# Patient Record
Sex: Male | Born: 1955 | Race: Black or African American | Hispanic: No | Marital: Single | Smoking: Never smoker
Health system: Southern US, Community
[De-identification: ages and names within clinical notes are randomized; demographics above are authoritative.]

## PROBLEM LIST (undated history)

## (undated) DIAGNOSIS — K5900 Constipation, unspecified: Secondary | ICD-10-CM

## (undated) DIAGNOSIS — K259 Gastric ulcer, unspecified as acute or chronic, without hemorrhage or perforation: Secondary | ICD-10-CM

## (undated) DIAGNOSIS — IMO0002 Reserved for concepts with insufficient information to code with codable children: Secondary | ICD-10-CM

---

## 2004-06-21 ENCOUNTER — Emergency Department (HOSPITAL_COMMUNITY): Admission: EM | Admit: 2004-06-21 | Discharge: 2004-06-22 | Payer: Self-pay | Admitting: Emergency Medicine

## 2005-04-02 ENCOUNTER — Ambulatory Visit: Payer: Self-pay | Admitting: Internal Medicine

## 2005-04-04 ENCOUNTER — Ambulatory Visit: Payer: Self-pay | Admitting: Family Medicine

## 2005-05-23 ENCOUNTER — Ambulatory Visit: Payer: Self-pay | Admitting: Family Medicine

## 2005-07-04 ENCOUNTER — Ambulatory Visit: Payer: Self-pay | Admitting: Family Medicine

## 2006-03-29 ENCOUNTER — Emergency Department (HOSPITAL_COMMUNITY): Admission: EM | Admit: 2006-03-29 | Discharge: 2006-03-29 | Payer: Self-pay | Admitting: Emergency Medicine

## 2009-04-29 ENCOUNTER — Inpatient Hospital Stay (HOSPITAL_COMMUNITY): Admission: EM | Admit: 2009-04-29 | Discharge: 2009-05-02 | Payer: Self-pay | Admitting: Emergency Medicine

## 2009-04-29 ENCOUNTER — Ambulatory Visit: Payer: Self-pay | Admitting: Gastroenterology

## 2009-04-30 ENCOUNTER — Encounter: Payer: Self-pay | Admitting: Gastroenterology

## 2009-05-10 ENCOUNTER — Ambulatory Visit: Payer: Self-pay | Admitting: Internal Medicine

## 2009-06-22 ENCOUNTER — Ambulatory Visit: Payer: Self-pay | Admitting: Internal Medicine

## 2009-06-25 ENCOUNTER — Ambulatory Visit: Payer: Self-pay | Admitting: *Deleted

## 2009-07-12 ENCOUNTER — Ambulatory Visit: Payer: Self-pay | Admitting: Internal Medicine

## 2009-07-18 ENCOUNTER — Ambulatory Visit: Payer: Self-pay | Admitting: Internal Medicine

## 2010-01-21 ENCOUNTER — Emergency Department (HOSPITAL_COMMUNITY): Admission: EM | Admit: 2010-01-21 | Discharge: 2010-01-21 | Payer: Self-pay | Admitting: Emergency Medicine

## 2010-06-20 ENCOUNTER — Emergency Department (HOSPITAL_COMMUNITY): Admission: EM | Admit: 2010-06-20 | Discharge: 2010-06-21 | Payer: Self-pay | Admitting: Emergency Medicine

## 2011-03-16 LAB — URINALYSIS, ROUTINE W REFLEX MICROSCOPIC
Bilirubin Urine: NEGATIVE
Nitrite: NEGATIVE
Protein, ur: NEGATIVE mg/dL
Urobilinogen, UA: 0.2 mg/dL (ref 0.0–1.0)
pH: 6 (ref 5.0–8.0)

## 2011-03-16 LAB — COMPREHENSIVE METABOLIC PANEL
ALT: 53 U/L (ref 0–53)
AST: 46 U/L — ABNORMAL HIGH (ref 0–37)
Albumin: 3.7 g/dL (ref 3.5–5.2)
Alkaline Phosphatase: 82 U/L (ref 39–117)
BUN: 14 mg/dL (ref 6–23)
Creatinine, Ser: 0.8 mg/dL (ref 0.4–1.5)
GFR calc non Af Amer: >60 mL/min (ref >60)
Glucose, Bld: 100 mg/dL — ABNORMAL HIGH (ref 70–99)
Potassium: 4.5 mEq/L (ref 3.5–5.1)
Total Protein: 7.3 g/dL (ref 6.0–8.3)

## 2011-03-16 LAB — CBC
HCT: 46.7 % (ref 39.0–52.0)
MCHC: 34.2 g/dL (ref 30.0–36.0)
MCV: 94.7 fL (ref 78.0–100.0)
RBC: 4.93 MIL/uL (ref 4.22–5.81)

## 2011-03-16 LAB — DIFFERENTIAL
Eosinophils Absolute: 0.1 10*3/uL (ref 0.0–0.7)
Lymphocytes Relative: 30 % (ref 12–46)
Lymphs Abs: 2.1 10*3/uL (ref 0.7–4.0)
Neutrophils Relative %: 59 % (ref 43–77)

## 2011-04-08 LAB — CBC
HCT: 13.5 % — ABNORMAL LOW (ref 39.0–52.0)
HCT: 13.8 % — ABNORMAL LOW (ref 39.0–52.0)
HCT: 25.1 % — ABNORMAL LOW (ref 39.0–52.0)
HCT: 28.9 % — ABNORMAL LOW (ref 39.0–52.0)
Hemoglobin: 10.3 g/dL — ABNORMAL LOW (ref 13.0–17.0)
Hemoglobin: 4.5 g/dL — CL (ref 13.0–17.0)
MCHC: 34.5 g/dL (ref 30.0–36.0)
MCHC: 34.5 g/dL (ref 30.0–36.0)
MCV: 91.8 fL (ref 78.0–100.0)
MCV: 92.5 fL (ref 78.0–100.0)
MCV: 92.7 fL (ref 78.0–100.0)
MCV: 99.7 fL (ref 78.0–100.0)
Platelets: 174 10*3/uL (ref 150–400)
Platelets: 177 10*3/uL (ref 150–400)
Platelets: 182 10*3/uL (ref 150–400)
Platelets: 191 10*3/uL (ref 150–400)
RBC: 1.36 MIL/uL — ABNORMAL LOW (ref 4.22–5.81)
RBC: 3.25 MIL/uL — ABNORMAL LOW (ref 4.22–5.81)
RDW: 16 % — ABNORMAL HIGH (ref 11.5–15.5)
RDW: 16.5 % — ABNORMAL HIGH (ref 11.5–15.5)
RDW: 16.8 % — ABNORMAL HIGH (ref 11.5–15.5)
RDW: 18.9 % — ABNORMAL HIGH (ref 11.5–15.5)
WBC: 8.9 10*3/uL (ref 4.0–10.5)
WBC: 9.1 10*3/uL (ref 4.0–10.5)
WBC: 9.5 10*3/uL (ref 4.0–10.5)

## 2011-04-08 LAB — RETICULOCYTES
RBC.: 1.39 MIL/uL — ABNORMAL LOW (ref 4.22–5.81)
Retic Ct Pct: 11.1 % — ABNORMAL HIGH (ref 0.4–3.1)

## 2011-04-08 LAB — COMPREHENSIVE METABOLIC PANEL
AST: 35 U/L (ref 0–37)
Albumin: 3 g/dL — ABNORMAL LOW (ref 3.5–5.2)
Alkaline Phosphatase: 49 U/L (ref 39–117)
Chloride: 114 mEq/L — ABNORMAL HIGH (ref 96–112)
Creatinine, Ser: 0.95 mg/dL (ref 0.4–1.5)
GFR calc Af Amer: >60 mL/min (ref >60)
GFR calc non Af Amer: >60 mL/min (ref >60)
Potassium: 3.8 mEq/L (ref 3.5–5.1)
Total Bilirubin: 0.3 mg/dL (ref 0.3–1.2)
Total Bilirubin: 0.3 mg/dL (ref 0.3–1.2)
Total Protein: 4.8 g/dL — ABNORMAL LOW (ref 6.0–8.3)
Total Protein: 5.6 g/dL — ABNORMAL LOW (ref 6.0–8.3)

## 2011-04-08 LAB — CROSSMATCH: ABO/RH(D): O POS

## 2011-04-08 LAB — LIPID PANEL
Cholesterol: 134 mg/dL (ref 0–200)
LDL Cholesterol: 91 mg/dL (ref 0–99)
Triglycerides: 43 mg/dL (ref <150)
VLDL: 9 mg/dL (ref 0–40)

## 2011-04-08 LAB — BASIC METABOLIC PANEL
BUN: 11 mg/dL (ref 6–23)
BUN: 12 mg/dL (ref 6–23)
CO2: 28 mEq/L (ref 19–32)
Calcium: 8.1 mg/dL — ABNORMAL LOW (ref 8.4–10.5)
Chloride: 110 mEq/L (ref 96–112)
Creatinine, Ser: 1.05 mg/dL (ref 0.4–1.5)
GFR calc non Af Amer: >60 mL/min (ref >60)
GFR calc non Af Amer: >60 mL/min (ref >60)
Glucose, Bld: 116 mg/dL — ABNORMAL HIGH (ref 70–99)
Glucose, Bld: 91 mg/dL (ref 70–99)
Potassium: 4.2 mEq/L (ref 3.5–5.1)
Sodium: 142 mEq/L (ref 135–145)

## 2011-04-08 LAB — APTT: aPTT: <20 seconds — ABNORMAL LOW (ref 24–37)

## 2011-04-08 LAB — CULTURE, BLOOD (ROUTINE X 2)
Culture: NO GROWTH
Culture: NO GROWTH

## 2011-04-08 LAB — DIFFERENTIAL
Basophils Absolute: 0 10*3/uL (ref 0.0–0.1)
Eosinophils Relative: 1 % (ref 0–5)
Eosinophils Relative: 1 % (ref 0–5)
Lymphocytes Relative: 17 % (ref 12–46)
Lymphocytes Relative: 19 % (ref 12–46)
Monocytes Absolute: 0.7 10*3/uL (ref 0.1–1.0)
Monocytes Absolute: 0.8 10*3/uL (ref 0.1–1.0)
Monocytes Relative: 7 % (ref 3–12)
Neutro Abs: 6.9 10*3/uL (ref 1.7–7.7)
Neutrophils Relative %: 73 % (ref 43–77)

## 2011-04-08 LAB — IRON AND TIBC
Iron: 27 ug/dL — ABNORMAL LOW (ref 42–135)
Saturation Ratios: 10 % — ABNORMAL LOW (ref 20–55)

## 2011-04-08 LAB — H. PYLORI ANTIBODY, IGG: H Pylori IgG: 4.1 {ISR} — ABNORMAL HIGH

## 2011-04-08 LAB — URINALYSIS, ROUTINE W REFLEX MICROSCOPIC
Bilirubin Urine: NEGATIVE
Protein, ur: NEGATIVE mg/dL
Specific Gravity, Urine: 1.016 (ref 1.005–1.030)
pH: 7.5 (ref 5.0–8.0)

## 2011-04-08 LAB — HEMOGLOBIN AND HEMATOCRIT, BLOOD
Hemoglobin: 9.2 g/dL — ABNORMAL LOW (ref 13.0–17.0)
Hemoglobin: 9.4 g/dL — ABNORMAL LOW (ref 13.0–17.0)

## 2011-04-08 LAB — FOLATE: Folate: 13.1 ng/mL

## 2011-04-08 LAB — FERRITIN: Ferritin: 26 ng/mL (ref 22–322)

## 2011-04-08 LAB — PROTIME-INR
INR: 1 (ref 0.00–1.49)
Prothrombin Time: 13.9 seconds (ref 11.6–15.2)

## 2011-04-08 LAB — ABO/RH: ABO/RH(D): O POS

## 2011-05-13 NOTE — Discharge Summary (Signed)
NAMEROBEL, WUERTZ             ACCOUNT NO.:  [id number]   MEDICAL RECORD NO.:  [id number]          PATIENT TYPE:  INP   LOCATION:  5526                         FACILITY:  [facility]   PHYSICIAN:  Lonia Blood, M.D.DATE OF BIRTH:  1956/07/24   DATE OF ADMISSION:  04/29/2009  DATE OF DISCHARGE:  05/02/2009                               DISCHARGE SUMMARY   ADDENDUM   PRIMARY CARE PHYSICIAN:  Unassigned - to be [facility].   DISCHARGE DIAGNOSES:  Unchanged from previously dictated summary.   DISCHARGE MEDICATIONS:  Change to the following updated list:  1. Prevpac - use as directed until completed.  2. Iron sulfate 325 mg 1 p.o. b.i.d. for 2 months then stop.  3. Omeprazole 20 mg 1 p.o. daily to be initiated after Prevpac is      completed.   FOLLOW UP:  1. Case management is being asked to assist with establishing the      patient at [facility]  The patient will follow up at [facility]      for a CBC check within one week and thereafter to establish ongoing      care as directed by [facility].  2. The patient is advised to call Dr. Wendall Papa at [facility] for      reevaluation in 2-3 weeks at phone number [phone].   DISCHARGE LABORATORY DATA:  Hemoglobin is 10.8 at time of discharge.  Previously pending H. pylori level has returned positive at 4.6.  No  pathology results are presently available from the patient's EGD.   ADDENDUM TO HOSPITAL COURSE:  Mr. Bruna was reevaluated on May 02, 2009.  Hemoglobin was actually improving.  The patient was asymptomatic.  The  patient was tolerating a regular diet.  He was therefore cleared for  discharge.   For details concerning the patient's hospitalization, please see  previously dictated discharge summary per Dr. Tamsen Roers dated May 01, 2009,  labeled job number [id number].      Lonia Blood, M.D.  Electronically Signed     Lonia Blood, M.D.  Electronically Signed   JTM/MEDQ  D:  05/02/2009  T:  05/02/2009   Job:  [id number]

## 2011-05-13 NOTE — H&P (Signed)
Caleb Rivers, BLITCH             ACCOUNT NO.:  [id number]   MEDICAL RECORD NO.:  [id number]          PATIENT TYPE:  INP   LOCATION:  1826                         FACILITY:  [facility]   PHYSICIAN:  Manus Gunning, MD      DATE OF BIRTH:  [birth year]   DATE OF ADMISSION:  04/29/2009  DATE OF DISCHARGE:                              HISTORY & PHYSICAL   CHIEF COMPLAINT:  Generalized weakness.   HISTORY OF PRESENT ILLNESS:  Caleb Rivers is a non-English speaking African  American male from [place].  H and P has been obtained by his friend who  is at the bedside who has essentially provided translation.  Caleb Rivers  basically complains of generalized body aches and weakness for the past  10 days.  He claims he noticed black sticky stools during bowel  movements approximately at that same period of time.  He claims that he  has been living with this but it has gotten so bad that he presented to  the emergency department today.  Laboratory tests in the emergency  department demonstrated a hemoglobin of 4.5 grams percent and Hemoccult  blood positive.  Gastroenterology has been consulted and they plan for  an EGD in the morning.  Of note, the patient denies any abdominal pain  and denies any nausea or vomiting.  No hematemesis.  No diarrhea,  constipation, dysuria, polyuria or hematuria.  He does complain of  melenic stools.  No bright red blood per rectum.  No musculoskeletal  complaints.  Generalized fatigue and weakness.  No chest pain,  palpitations, PND or orthopnea.  No headaches, syncope, presyncope,  tinnitus, loss of consciousness, seizures or falls.  No recent trauma or  injury.  No ingestion of unusual foods.  No travel.  The patient does  admit that he is taking Motrin and Advil daily, approximately 3 to 4  pills a day depending on how he feels.  He claims the dose is unknown  but usually doses are 200 mg.   PAST MEDICAL HISTORY:  None.   PAST SURGICAL HISTORY:  None.   ALLERGIES:   None.   MEDICATIONS:  Over the counter Motrin and Advil.   FAMILY HISTORY:  The patient is unable to provide due to communication  issues.   REVIEW OF SYSTEMS:  A 14 point review of systems performed.  Pertinent  positives and negatives as described above.   PHYSICAL EXAMINATION:  VITALS AT TIME OF PRESENTATION:  Temperature  afebrile, heart rate 92, respiratory rate 14, blood pressure 132/62, O2  sat 100% on room air.  GENERAL:  Well developed, well nourished African American male lying in  bed comfortably, in no apparent distress, appears fatigued.  HEENT:  Normocephalic, atraumatic.  Moist oral mucosa.  No thrush,  erythema or exudates.  Eyes anicteric.  Extraocular muscles intact.  Conjunctivae pale and dry.  Pupils are equal and react to light and  accommodation.  NECK:  Supple.  Good range of motion.  No thyromegaly.  Flat neck veins.  CARDIOVASCULAR:  S1, S2 normal.  Regular rate and rhythm.  No murmurs,  rubs or  gallops.  RS:  Air entry bilaterally equal.  No rales, rhonchi or wheezes  appreciated.  ABDOMEN:  Soft, nontender and nondistended, positive bowel sounds, no  organomegaly.  SKIN:  No breakdown, swelling, ulcerations or masses.  HEME/ONC:  No palpable lymphadenopathy, ecchymosis, bruising or  petechiae.  CNS:  Alert and oriented times three.  Cranial nerves II-XII grossly  intact.  Power, sensation, and reflexes bilaterally symmetrical.  EXTREMITIES:  No cyanosis, clubbing or edema.  Positive bilateral  dorsalis pedis.  Nailbeds are pale as well.   LABORATORY DATA:  Fecal occult blood positive.  CBC demonstrates WBC  8900, hemoglobin 4.5, hematocrit 13.5, platelet count 174, polymorphs  73.  Sodium 144, potassium 4, chloride 116, bicarb 24, glucose 104, BUN  13, creatinine 1.0, total bili 0.3, alkaline phosphatase 49, AST 35, ALT  36, total protein 4.8, albumin 2.7, calcium 8.0.  Urinalysis is  essentially negative.   ASSESSMENT/PLAN:  1. Gastrointestinal  bleed, most likely upper.  Gastroenterology has      already been consulted.  I have given a bolus of IV proton pump      inhibitor followed by a continuous drip.  He is allowed clear      liquids for now, NPO past midnight.  2. The patient is to receive an EGD in the morning.  3. Transfuse 4 units of packed red blood cells and provide 40 mg      between the second and third unit.  Obtain H and H post fourth unit      of packed red blood cells and check serum Helicobacter pylori.  4. DVT prophylaxis.  Sequential compression devices.  The patient      already on proton pump inhibitor continuous infusion.  No need for      further GI prophylaxis.      Manus Gunning, MD  Electronically Signed     SP/MEDQ  D:  04/29/2009  T:  04/29/2009  Job:  [id number]

## 2011-05-13 NOTE — Consult Note (Signed)
NAMEMACALLAN, ORD NO.:  [id number]   MEDICAL RECORD NO.:  [id number]          PATIENT TYPE:  EMS   LOCATION:  MAJO                         FACILITY:  [facility]   PHYSICIAN:  Rachael Fee, MD   DATE OF BIRTH:  1956/04/15   DATE OF CONSULTATION:  04/29/2009  DATE OF DISCHARGE:                                 CONSULTATION   [facility] physicians asked me to evaluate Caleb Rivers in consultation  regarding melena, significant anemia.   HISTORY OF PRESENT ILLNESS:  Caleb Rivers is a very pleasant 55 year old  [place] speaking only [place] man (he was born in [place]).  The vast  majority of his history was obtained by speaking through his brother  acting as a Nurse, learning disability over the phone.  Caleb Rivers noticed fairly soft  black colored stools starting about 10 days ago.  He has had one to two  of these a  day ever since then.  He has had no red rectal bleeding.  No  hematemesis, no abdominal pains, no nausea, no vomiting.  In the past 3-  4 days he has been getting increasingly fatigued, dyspnea on exertion,  lightheaded when standing, dizzy when standing.  He has had no chest  pains.  He takes four Motrin a  day for the past week or so and prior to  that he was taking at least three to four Advil a day for several  months.  He has never had bleeding problems in the past.  He is  otherwise in generally very good health.  He presented to the emergency  room for evaluation of fatigue, orthostatic symptoms and was found to  have a hemoglobin of 4.5.  Rectal examination by the emergency room team  revealed black colored heme-positive stool.  [facility] physicians were  asked to admit him and I was called in consultation.   REVIEW OF SYSTEMS:  Notable for orthostatic symptoms described above, no  nausea, no vomiting, no abdominal pain.  No headaches, no dysuria, no  fevers, no chills.  Otherwise a complete review of systems was reviewed  and was negative.   PAST MEDICAL  HISTORY:  None.   OUTPATIENT MEDICATIONS:  Motrin four a day for the past week, prior to  that several Advil a day.   ALLERGIES:  NO KNOWN DRUG ALLERGIES.   SOCIAL HISTORY:  Nondrinker, nonsmoker.  He is divorced.  He has no  children.  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   FAMILY HISTORY:  No bleeding troubles.  No cancers that he is aware of.   Review of laboratory data:  White blood count 8.9, hemoglobin 4.5,  platelets 174, MCV 94, sodium 144, potassium 4, BUN 13, creatinine 1.0,  albumin 2.7.   PHYSICAL EXAMINATION:  Initially  in the emergency room his pulse was  106.  His blood pressure was 124/71.  After 1 liter of IV fluids, his  blood pressure is 132/62, pulse 91, breathing 18 per minute, satting  100% on 2 liters nasal cannula.  He is alert and oriented x3.  Eyes, extraocular movements intact.  Mouth, oropharynx  moist.  No  lesions.  NECK:  Supple.  No lymphadenopathy.  CARDIOVASCULAR:  Heart regular rate and rhythm.  LUNGS:  Clear to auscultation bilaterally.  ABDOMEN:  Soft, nontender, nondistended.  Normal bowel sounds.  EXTREMITIES:  No lower extremity edema.  SKIN:  No rashes or lesions on visible extremities.  PSYCHIATRIC:  Mood is upbeat.   ASSESSMENT/PLAN:  55 year old man from [place] with significant GI  bleeding.   He is hemodynamically stable and so I do not think that he is having  ongoing, active bleeding.  Hemoglobin was 4.5 which is quite  life-  threatening.  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  It  is a bit unusual that his BUN to creatinine ratio is normal.  However,  perhaps his bleeding stopped 2-3 days ago.  Other possibilities include  AVMs, neoplasm (upper or lower).   I recommend he be admitted to the step-down unit, transfuse blood  products.  He will likely need 4 to 5  packed red blood cell units to  get his hemoglobin above 9 or 10..  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  He can have  clear liquids today, n.p.o. after midnight.  We will plan to proceed  with an EGD for tomorrow some time.      Rachael Fee, MD  Electronically Signed     DPJ/MEDQ  D:  04/29/2009  T:  04/29/2009  Job:  [id number]

## 2011-05-13 NOTE — Discharge Summary (Signed)
NAMEDEMARUS, LATTERELL             ACCOUNT NO.:  [id number]   MEDICAL RECORD NO.:  [id number]          PATIENT TYPE:  INP   LOCATION:  5526                         FACILITY[facility]  [facility]   PHYSICIAN:  Beckey Rutter, MD  DATE OF BIRTH:  1956-08-06   DATE OF ADMISSION:  04/29/2009  DATE OF DISCHARGE:                               DISCHARGE SUMMARY   PRIMARY CARE PHYSICIAN:  Mr. Adriane Guglielmo is not unassigned to [facility].   CHIEF COMPLAINT:  Severe weakness.   HOSPITAL COURSE:  During hospital stay, the patient was found severely  anemic.  He received 4 units blood transfusion, and he was seen by  Gastroenterology.  The patient had undergone EGD on Apr 30, 2009.  The  patient was found to have:  1. A duodenal ulcer.  2. Gastritis and esophagitis in the distal esophagus area.  3. Erosion, multiple in the antrum.   The recommendation is to follow up with Pathology.  1. CLO biopsy with [facility].  2. To continue antireflux regimen.  3. Proton pump inhibitor once a day in the morning.  4. Void aspirin and Motrin and the nonsteroidal anti-inflammatory      medication.   DISCHARGE DIAGNOSIS:  1. Acute on chronic blood loss anemia.  2. Iron deficiency anemia.  3. Duodenal ulcer and gastric erosions.   DISCHARGE MEDICATIONS:  1. Protonix 40 mg p.o. daily in the morning.  2. Nu-Iron 150 mg p.o. b.i.d.   Avoid nonsteroidal anti-inflammatory medication including aspirin and  Motrin.   Mr. Avey hemoglobin dropped from 10 to 8.8 this morning.  I will  transfuse 1 unit of packed RBCs and will follow up with the  posttransfusion CBC and observe.  I suspect today or by the morning he  will be discharge if his H and H remained stable.      Beckey Rutter, MD  Electronically Signed    EME/MEDQ  D:  05/01/2009  T:  05/01/2009  Job:  [id number]

## 2011-05-16 NOTE — Consult Note (Signed)
Caleb Rivers, Caleb Rivers             ACCOUNT NO.:  [id number]   MEDICAL RECORD NO.:  [id number]          PATIENT TYPE:  EMS   LOCATION:  MAJO                         FACILITY:  [facility]   PHYSICIAN:  Wilmon Arms. Corliss Skains, M.D. DATE OF BIRTH:  11-14-1956   DATE OF CONSULTATION:  03/29/2006  DATE OF DISCHARGE:                                   CONSULTATION   CHIEF COMPLAINT:  Abdominal pain, nausea and vomiting.   The patient is a 55 year old male who speaks limited English, who presents  with no bowel movements for three days and now with cramping generalized  abdominal pain, nausea and vomiting.  The patient presented to the emergency  department, where he was evaluated.  He was noted to be afebrile with good  vital signs.  His white count was normal.  He underwent a CT scan with oral  and IV contrast, which showed distended distal small bowel loops with no  discrete transition point.  There was a tiny amount of free fluid in the  pelvis.  Since the CT scan, the patient has been completely asymptomatic  with no nausea, vomiting, or abdominal pain.   PAST MEDICAL HISTORY:  None.   PAST SURGICAL HISTORY:  None.   MEDICATIONS:  Cialis p.r.n.   ALLERGIES:  None.   SOCIAL HISTORY:  Nonsmoker, nondrinker.   PHYSICAL EXAMINATION:  VITAL SIGNS:  Temp 98.2, blood pressure 117/77, heart  rate 80, respirations 16.  GENERAL:  This is a well-developed and well-nourished male in no apparent  distress.  HEENT:  EOMI.  Sclerae are anicteric.  NECK:  No masses or thyromegaly.  LUNGS:  Clear to auscultation bilaterally.  HEART:  Regular rate and rhythm.  No murmurs.  ABDOMEN:  Positive bowel sounds.  Soft, nontender, nondistended.  No  palpable masses.   LABS:  White count 9.5, hemoglobin 16.4, platelet count 198.  Electrolytes  are all within normal limits.  Liver function tests are also within normal  limits.  Lipase is 25.   IMPRESSION:  No acute process.  This could be a partial small bowel  obstruction from constipation, but the patient is currently asymptomatic and  has not had any pain medication in over six hours.   RECOMMENDATIONS:  We could admit the patient for observation, but he states  that he wants to go home.  Would recommend a p.r.n. over-the-counter  laxative.  He may return if his symptoms recur.      Wilmon Arms. Tsuei, M.D.  Electronically Signed     MKT/MEDQ  D:  03/29/2006  T:  03/30/2006  Job:  [id number]

## 2011-10-17 ENCOUNTER — Emergency Department (HOSPITAL_COMMUNITY)
Admission: EM | Admit: 2011-10-17 | Discharge: 2011-10-17 | Disposition: A | Discharge status: Home / Self Care | Payer: Self-pay | Attending: Emergency Medicine | Admitting: Emergency Medicine

## 2011-10-17 DIAGNOSIS — R350 Frequency of micturition: Secondary | ICD-10-CM | POA: Insufficient documentation

## 2011-10-17 LAB — URINALYSIS, ROUTINE W REFLEX MICROSCOPIC
Leukocytes, UA: NEGATIVE
Nitrite: NEGATIVE
Specific Gravity, Urine: 1.02 (ref 1.005–1.030)
pH: 5.5 (ref 5.0–8.0)

## 2011-10-17 LAB — GLUCOSE, CAPILLARY

## 2011-10-17 LAB — POCT I-STAT, CHEM 8
Calcium, Ion: 1.14 mmol/L (ref 1.12–1.32)
Chloride: 105 mEq/L (ref 96–112)
HCT: 50 % (ref 39.0–52.0)
Potassium: 3.8 mEq/L (ref 3.5–5.1)
Sodium: 139 mEq/L (ref 135–145)

## 2011-10-18 LAB — URINE CULTURE
Colony Count: NO GROWTH
Culture  Setup Time: 201210191410

## 2012-03-13 ENCOUNTER — Emergency Department (HOSPITAL_COMMUNITY): Payer: Self-pay

## 2012-03-13 ENCOUNTER — Encounter (HOSPITAL_COMMUNITY): Payer: Self-pay

## 2012-03-13 ENCOUNTER — Emergency Department (HOSPITAL_COMMUNITY)
Admission: EM | Admit: 2012-03-13 | Discharge: 2012-03-13 | Disposition: A | Discharge status: Home / Self Care | Payer: Self-pay | Attending: Emergency Medicine | Admitting: Emergency Medicine

## 2012-03-13 DIAGNOSIS — A088 Other specified intestinal infections: Secondary | ICD-10-CM | POA: Insufficient documentation

## 2012-03-13 DIAGNOSIS — A084 Viral intestinal infection, unspecified: Secondary | ICD-10-CM

## 2012-03-13 DIAGNOSIS — M25519 Pain in unspecified shoulder: Secondary | ICD-10-CM | POA: Insufficient documentation

## 2012-03-13 DIAGNOSIS — Z7982 Long term (current) use of aspirin: Secondary | ICD-10-CM | POA: Insufficient documentation

## 2012-03-13 DIAGNOSIS — R109 Unspecified abdominal pain: Secondary | ICD-10-CM | POA: Insufficient documentation

## 2012-03-13 DIAGNOSIS — R197 Diarrhea, unspecified: Secondary | ICD-10-CM | POA: Insufficient documentation

## 2012-03-13 DIAGNOSIS — K921 Melena: Secondary | ICD-10-CM | POA: Insufficient documentation

## 2012-03-13 DIAGNOSIS — R112 Nausea with vomiting, unspecified: Secondary | ICD-10-CM | POA: Insufficient documentation

## 2012-03-13 LAB — CBC
HCT: 46.7 % (ref 39.0–52.0)
Hemoglobin: 16.7 g/dL (ref 13.0–17.0)
MCHC: 35.8 g/dL (ref 30.0–36.0)
RBC: 5.13 MIL/uL (ref 4.22–5.81)

## 2012-03-13 LAB — DIFFERENTIAL
Basophils Absolute: 0 10*3/uL (ref 0.0–0.1)
Eosinophils Absolute: 0 10*3/uL (ref 0.0–0.7)
Eosinophils Relative: 0 % (ref 0–5)
Neutro Abs: 6.8 10*3/uL (ref 1.7–7.7)
Neutrophils Relative %: 87 % — ABNORMAL HIGH (ref 43–77)

## 2012-03-13 LAB — COMPREHENSIVE METABOLIC PANEL
ALT: 43 U/L (ref 0–53)
AST: 34 U/L (ref 0–37)
Albumin: 3.6 g/dL (ref 3.5–5.2)
Calcium: 9.3 mg/dL (ref 8.4–10.5)
GFR calc Af Amer: >90 mL/min (ref >90)
Sodium: 139 mEq/L (ref 135–145)
Total Protein: 7.5 g/dL (ref 6.0–8.3)

## 2012-03-13 MED ORDER — IOHEXOL 300 MG/ML  SOLN
100.0000 mL | Freq: Once | INTRAMUSCULAR | Status: AC | PRN
  Administered 2012-03-13: 100 mL via INTRAVENOUS

## 2012-03-13 MED ORDER — ACETAMINOPHEN 325 MG PO TABS
650.0000 mg | ORAL_TABLET | Freq: Once | ORAL | Status: AC
  Administered 2012-03-13: 650 mg via ORAL
  Filled 2012-03-13: qty 2

## 2012-03-13 MED ORDER — SODIUM CHLORIDE 0.9 % IV BOLUS (SEPSIS)
1000.0000 mL | Freq: Once | INTRAVENOUS | Status: AC
  Administered 2012-03-13: 1000 mL via INTRAVENOUS

## 2012-03-13 MED ORDER — ONDANSETRON HCL 4 MG/2ML IJ SOLN
4.0000 mg | Freq: Once | INTRAMUSCULAR | Status: AC
  Administered 2012-03-13: 4 mg via INTRAVENOUS
  Filled 2012-03-13: qty 2

## 2012-03-13 MED ORDER — ONDANSETRON HCL 4 MG PO TABS: 4.0000 mg | ORAL_TABLET | Freq: Four times a day (QID) | ORAL | Status: AC

## 2012-03-13 MED ORDER — MORPHINE SULFATE 4 MG/ML IJ SOLN
4.0000 mg | Freq: Once | INTRAMUSCULAR | Status: AC
  Administered 2012-03-13: 4 mg via INTRAVENOUS
  Filled 2012-03-13: qty 1

## 2012-03-13 NOTE — Discharge Instructions (Signed)
Your symptoms are likely from having a viral stomach upset.  Follow instruction below for care.  Your chest xray today shows some nodules that are likely chronic, however please have a repeat chest xray perform 3-4 months from now to ensure no changes.  Take zofran for nausea.  Return to ER if you can't keep fluid down, if you have fever, or if you have any other concerns.    Nausea and Vomiting Nausea is a sick feeling that often comes before throwing up (vomiting). Vomiting is a reflex where stomach contents come out of your mouth. Vomiting can cause severe loss of body fluids (dehydration). Children and elderly adults can become dehydrated quickly, especially if they also have diarrhea. Nausea and vomiting are symptoms of a condition or disease. It is important to find the cause of your symptoms. CAUSES   Direct irritation of the stomach lining. This irritation can result from increased acid production (gastroesophageal reflux disease), infection, food poisoning, taking certain medicines (such as nonsteroidal anti-inflammatory drugs), alcohol use, or tobacco use.   Signals from the brain.These signals could be caused by a headache, heat exposure, an inner ear disturbance, increased pressure in the brain from injury, infection, a tumor, or a concussion, pain, emotional stimulus, or metabolic problems.   An obstruction in the gastrointestinal tract (bowel obstruction).   Illnesses such as diabetes, hepatitis, gallbladder problems, appendicitis, kidney problems, cancer, sepsis, atypical symptoms of a heart attack, or eating disorders.   Medical treatments such as chemotherapy and radiation.   Receiving medicine that makes you sleep (general anesthetic) during surgery.  DIAGNOSIS Your caregiver may ask for tests to be done if the problems do not improve after a few days. Tests may also be done if symptoms are severe or if the reason for the nausea and vomiting is not clear. Tests may  include:  Urine tests.   Blood tests.   Stool tests.   Cultures (to look for evidence of infection).   X-rays or other imaging studies.  Test results can help your caregiver make decisions about treatment or the need for additional tests. TREATMENT You need to stay well hydrated. Drink frequently but in small amounts.You may wish to drink water, sports drinks, clear broth, or eat frozen ice pops or gelatin dessert to help stay hydrated.When you eat, eating slowly may help prevent nausea.There are also some antinausea medicines that may help prevent nausea. HOME CARE INSTRUCTIONS   Take all medicine as directed by your caregiver.   If you do not have an appetite, do not force yourself to eat. However, you must continue to drink fluids.   If you have an appetite, eat a normal diet unless your caregiver tells you differently.   Eat a variety of complex carbohydrates (rice, wheat, potatoes, bread), lean meats, yogurt, fruits, and vegetables.   Avoid high-fat foods because they are more difficult to digest.   Drink enough water and fluids to keep your urine clear or pale yellow.   If you are dehydrated, ask your caregiver for specific rehydration instructions. Signs of dehydration may include:   Severe thirst.   Dry lips and mouth.   Dizziness.   Dark urine.   Decreasing urine frequency and amount.   Confusion.   Rapid breathing or pulse.  SEEK IMMEDIATE MEDICAL CARE IF:   You have blood or brown flecks (like coffee grounds) in your vomit.   You have black or bloody stools.   You have a severe headache or stiff neck.  You are confused.   You have severe abdominal pain.   You have chest pain or trouble breathing.   You do not urinate at least once every 8 hours.   You develop cold or clammy skin.   You continue to vomit for longer than 24 to 48 hours.   You have a fever.  MAKE SURE YOU:   Understand these instructions.   Will watch your condition.    Will get help right away if you are not doing well or get worse.  Document Released: 12/15/2005 Document Revised: 12/04/2011 Document Reviewed: 05/14/2011 [facility] Patient Information 2012 [place], [place].   RESOURCE GUIDE  Dental Problems  Patients with Medicaid: [facility]                     [phone] [address]                                           Phone:  [phone]                                                  If unable to pay or uninsured, contact:  [facility] or [facility]. to become qualified for the adult dental clinic.  Chronic Pain Problems Contact [facility]  [phone] Patients need to be referred by their primary care doctor.  Insufficient Money for Medicine Contact United Way:  call "211" or [facility] 716-838-0909.  No Primary Care Doctor Call [facility]  3205763060 Other agencies that provide inexpensive medical care    [name]  [phone]    [facility]  [phone]    [facility]  [phone]    [facility]  [phone]    [facility]anned Parenthood  [phone]    [facility]  [phone]  Substance Abuse Resources Alcohol and Drug Services  757-180-6125 [facility] ([phone] [facility] [phone] Floydene Flock 5095697945 Residential & Outpatient Substance Abuse Program  [phone]  Psychological Services [facility] Behavioral Health  [phone] [facility]  [phone] [facility] Mental Health   5064487081 (emergency services [phone])  Abuse/Neglect [facility] Child Abuse Hotline 4095080370 [facility] Child Abuse Hotline 6121447281 (After Hours)  Emergency Shelter [facility] 206-224-6151  M[facility] Homes [facility] [place] [facility] [phone] [facility] [phone]  MRSA Hotline #:   (607) 533-2956    [facility] Resources  Free  [facility] [place]  [employer]                           [facility] Dept. [address] [place]                     [address]         [address]  [address]  Cristobal Goldmann Phone:  [phone]                                  Phone:  [phone]                   Phone:  [phone]  [facility] Phone:  [phone]  [facility] Child Abuse Hotline 210 591 6117 [phone] (After Hours)

## 2012-03-13 NOTE — ED Provider Notes (Signed)
Assumed patient care 1207. Patient presents with nausea vomiting and diarrhea consistence with a GI viral infection. Number CT scan shows no significant acute finding. 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 Patient was instructed to followup chest x-ray to 4 months as recommended. Patient also will be given medication to treat for his GI symptoms.  Pt currently symptom free.  He request to have lunch.  If pt able to tolerates PO, he will be discharge with appropriate f/u.    Fayrene Helper, PA-C 03/13/12 1319

## 2012-03-13 NOTE — ED Notes (Signed)
Report given to Marylu Lund, [place]. Pt moved to CDU. Updated on plan of care. Vital signs stable.

## 2012-03-13 NOTE — ED Provider Notes (Signed)
History     CSN: [id number]  Arrival date & time 03/13/12  0807   First MD Initiated Contact with Patient 03/13/12 [phone]      Chief Complaint  Patient presents with  . Abdominal Pain    (Consider location/radiation/quality/duration/timing/severity/associated sxs/prior treatment) Patient is a 56 y.o. male presenting with abdominal pain. The history is provided by the patient. The history is limited by a language barrier.  Abdominal Pain The primary symptoms of the illness include abdominal pain, nausea, vomiting, diarrhea and hematochezia.   patient has had abdominal pain nausea vomiting diarrhea. He states he's also had some blood in the stool. There is somewhat of a language barrier. He states his right shoulder has been hurting him he has been taking aspirin. The pain is constant. He states he is hungry. He states he drinks water it hurts more.  History reviewed. No pertinent past medical history.  History reviewed. No pertinent past surgical history.  History reviewed. No pertinent family history.  History  Substance Use Topics  . Smoking status: Never Smoker   . Smokeless tobacco: Not on file  . Alcohol Use: No      Review of Systems  Unable to perform ROS: Other  Gastrointestinal: Positive for nausea, vomiting, abdominal pain, diarrhea and hematochezia.    Allergies  Review of patient's allergies indicates no known allergies.  Home Medications   Current Outpatient Rx  Name Route Sig Dispense Refill  . ASPIRIN 81 MG PO TABS Oral Take 81 mg by mouth daily.      BP 150/110  Pulse 102  Temp 98.5 F (36.9 C)  Resp 19  SpO2 100%  Physical Exam  Nursing note and vitals reviewed. Constitutional: He is oriented to person, place, and time. He appears well-developed and well-nourished.  HENT:  Head: Normocephalic and atraumatic.  Eyes: EOM are normal. Pupils are equal, round, and reactive to light.  Neck: Normal range of motion. Neck supple.  Cardiovascular:  Normal rate, regular rhythm and normal heart sounds.   No murmur heard. Pulmonary/Chest: Effort normal and breath sounds normal.  Abdominal: Soft. Bowel sounds are normal. He exhibits no distension and no mass. There is tenderness. There is no rebound and no guarding.       Minimal diffuse tenderness without rebound or guarding.  Musculoskeletal: Normal range of motion. He exhibits no edema.  Neurological: He is alert and oriented to person, place, and time. No cranial nerve deficit.  Skin: Skin is warm and dry.  Psychiatric: He has a normal mood and affect.    ED Course  Procedures (including critical care time)   Labs Reviewed  CBC  DIFFERENTIAL  COMPREHENSIVE METABOLIC PANEL  LIPASE, BLOOD  SALICYLATE LEVEL   No results found.   No diagnosis found.    MDM  Nausea vomiting and some diarrhea. Question of blood in the diarrhea. Patient has a language barrier. Lab work is overall reassuring so far. X-ray shows ileus versus possibly early small bowel obstruction. Patient states he feels better, but he will get a CT. There is also a possible lung nodule on the one the x-ray. A two-view x-ray will be done        [employer]. Rubin Payor, MD 03/13/12 [phone]

## 2012-03-13 NOTE — ED Notes (Signed)
Pt complains of abd pain since yesterday,sts he is hungry but cant eat, pt sts that he has rectal bleeding.

## 2012-03-13 NOTE — ED Notes (Signed)
Radiology notified that pt has finished drinking oral contrast.

## 2012-03-13 NOTE — ED Notes (Signed)
Pt presents to department for evaluation of abdominal pain, N/V and dark bloody stools. Onset yesterday. Pt states pain 8/10 at the time, becomes worse with eating/drinking. States dark bloody diarrhea this morning. Abdomen soft and non tender. Bowel sounds present all quadrants. Pt conscious alert and oriented x4.

## 2012-03-13 NOTE — ED Notes (Signed)
Pt c/o of being hungry again. Explained that we will need to wait until ct scan results are completed.

## 2012-03-14 NOTE — ED Provider Notes (Signed)
Medical screening examination/treatment/procedure(s) were performed by non-physician practitioner and as supervising physician I was immediately available for consultation/collaboration.  Quinnie Barcelo R. Aymen Widrig, MD 03/14/12 0659 

## 2013-05-03 IMAGING — CR DG ABDOMEN ACUTE W/ 1V CHEST
3 series · 3 of 3 positions shown · non-contrast
Comparison: None.

CLINICAL DATA: Abdominal pain, vomiting, nausea

ACUTE ABDOMEN SERIES (ABDOMEN 2 VIEW & CHEST 1 VIEW)

[w chest pa]
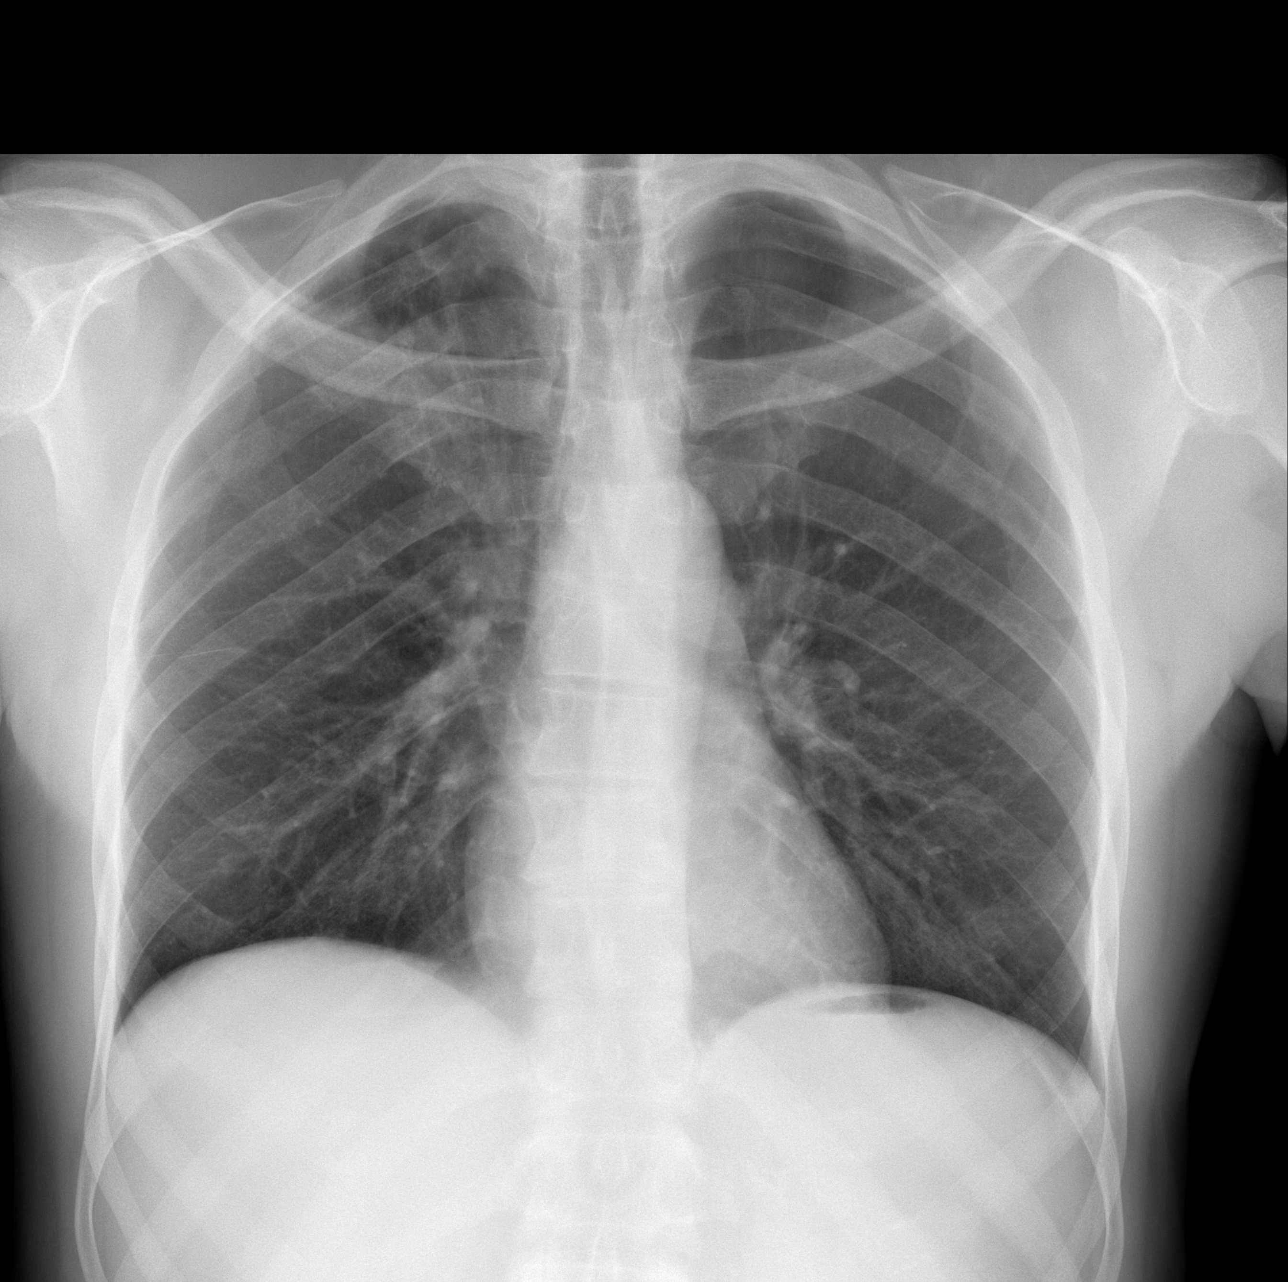

[w abdomen upright]
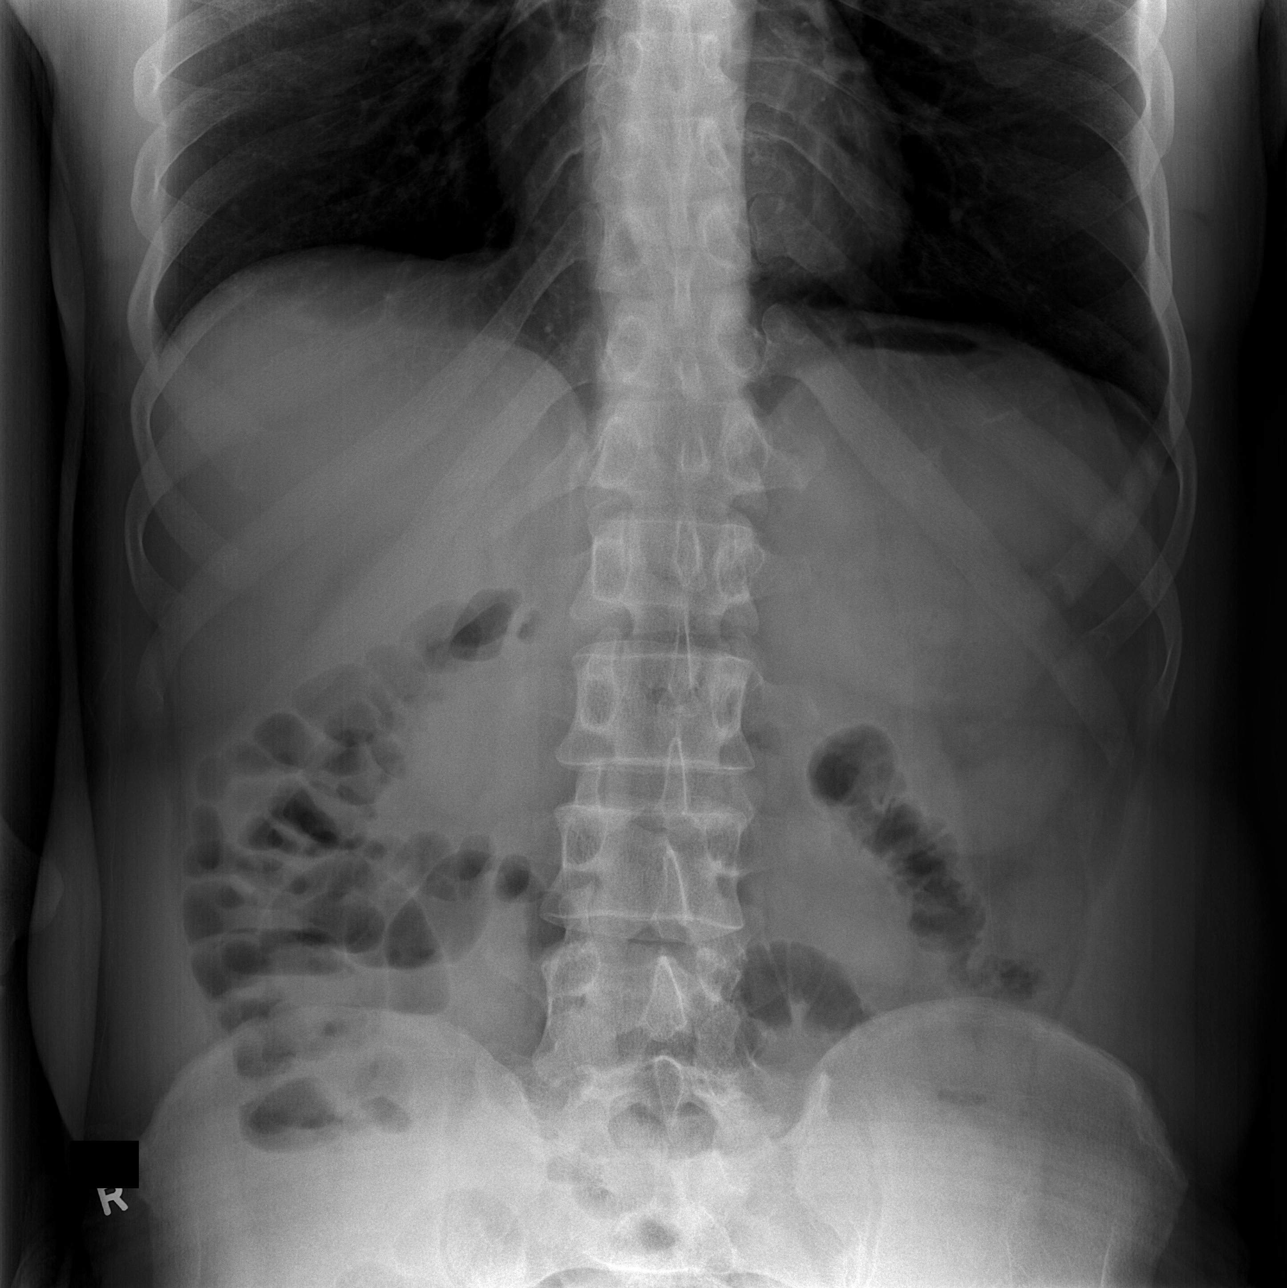

[t abdomen supine]
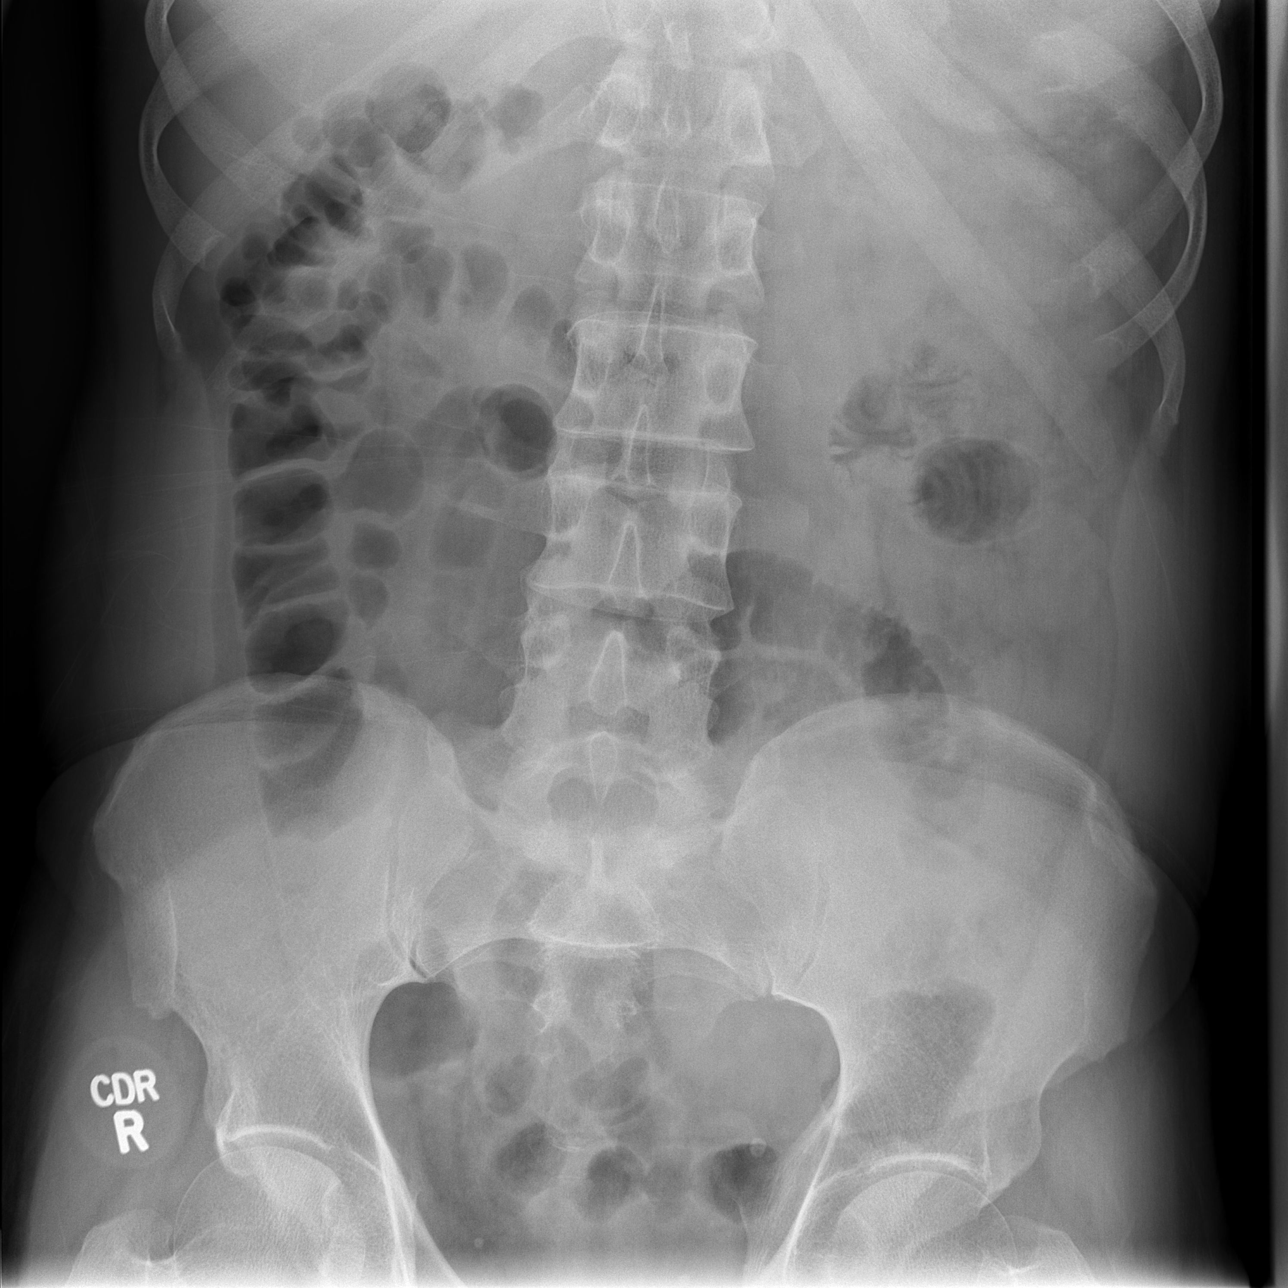

[3 of 3 positions shown; findings below may reference images not displayed]

FINDINGS: Cardiomediastinal silhouette is unremarkable.  No acute
infiltrate or pulmonary edema.  Nonspecific nodular density in the
right upper lobe measures about  6 mm. Although may represent a
artifact from fourth rib a lung nodule cannot be excluded.
Followup short interval two-view chest x-ray is recommended for
confirmation.

No free abdominal air is noted.  Mild gaseous distended small bowel
loops mid lower abdomen.  Mild ileus or early obstruction cannot be
excluded.
IMPRESSION: 1.  No acute infiltrate or pulmonary edema.  Nonspecific nodular
density in the right upper lobe measures about  6 mm.] Although may
represent a artifact from fourth rib a lung nodule cannot be
excluded.  Followup short interval two-view chest x-ray is
recommended for confirmation.
2.Mild gaseous distended small bowel loops mid lower abdomen.  Mild
ileus or early obstruction cannot be excluded.

## 2013-09-25 ENCOUNTER — Encounter (HOSPITAL_COMMUNITY): Admission: EM | Disposition: A | Discharge status: Home / Self Care | Payer: Self-pay | Source: Home / Self Care

## 2013-09-25 ENCOUNTER — Observation Stay (HOSPITAL_COMMUNITY)
Admission: EM | Admit: 2013-09-25 | Discharge: 2013-09-26 | Disposition: A | Discharge status: Home / Self Care | Payer: BC Managed Care – PPO | Attending: Cardiovascular Disease | Admitting: Cardiovascular Disease

## 2013-09-25 ENCOUNTER — Encounter (HOSPITAL_COMMUNITY): Payer: Self-pay | Admitting: *Deleted

## 2013-09-25 ENCOUNTER — Ambulatory Visit (HOSPITAL_COMMUNITY): Admit: 2013-09-25 | Payer: Self-pay | Admitting: Cardiovascular Disease

## 2013-09-25 DIAGNOSIS — Z8711 Personal history of peptic ulcer disease: Secondary | ICD-10-CM | POA: Insufficient documentation

## 2013-09-25 DIAGNOSIS — I213 ST elevation (STEMI) myocardial infarction of unspecified site: Secondary | ICD-10-CM | POA: Diagnosis present

## 2013-09-25 DIAGNOSIS — K2970 Gastritis, unspecified, without bleeding: Secondary | ICD-10-CM | POA: Diagnosis present

## 2013-09-25 DIAGNOSIS — R9431 Abnormal electrocardiogram [ECG] [EKG]: Principal | ICD-10-CM | POA: Diagnosis present

## 2013-09-25 DIAGNOSIS — I219 Acute myocardial infarction, unspecified: Secondary | ICD-10-CM

## 2013-09-25 HISTORY — DX: Constipation, unspecified: K59.00

## 2013-09-25 HISTORY — DX: Reserved for concepts with insufficient information to code with codable children: IMO0002

## 2013-09-25 HISTORY — PX: LEFT HEART CATHETERIZATION WITH CORONARY ANGIOGRAM: SHX5451

## 2013-09-25 LAB — COMPREHENSIVE METABOLIC PANEL
ALT: 48 U/L (ref 0–53)
Alkaline Phosphatase: 77 U/L (ref 39–117)
CO2: 24 mEq/L (ref 19–32)
Calcium: 9.5 mg/dL (ref 8.4–10.5)
GFR calc Af Amer: >90 mL/min (ref >90)
GFR calc non Af Amer: >90 mL/min (ref >90)
Glucose, Bld: 100 mg/dL — ABNORMAL HIGH (ref 70–99)
Sodium: 135 mEq/L (ref 135–145)
Total Bilirubin: 0.4 mg/dL (ref 0.3–1.2)

## 2013-09-25 LAB — PROTIME-INR: Prothrombin Time: 12.8 seconds (ref 11.6–15.2)

## 2013-09-25 LAB — CBC WITH DIFFERENTIAL/PLATELET
Eosinophils Relative: 1 % (ref 0–5)
HCT: 46.9 % (ref 39.0–52.0)
Lymphocytes Relative: 25 % (ref 12–46)
Lymphs Abs: 1.8 10*3/uL (ref 0.7–4.0)
MCV: 91.2 fL (ref 78.0–100.0)
Platelets: 206 10*3/uL (ref 150–400)
RBC: 5.14 MIL/uL (ref 4.22–5.81)
WBC: 7.4 10*3/uL (ref 4.0–10.5)

## 2013-09-25 LAB — URINALYSIS, ROUTINE W REFLEX MICROSCOPIC
Bilirubin Urine: NEGATIVE
Hgb urine dipstick: NEGATIVE
Ketones, ur: NEGATIVE mg/dL
Protein, ur: NEGATIVE mg/dL
Urobilinogen, UA: 0.2 mg/dL (ref 0.0–1.0)

## 2013-09-25 LAB — APTT: aPTT: 32 seconds (ref 24–37)

## 2013-09-25 SURGERY — Surgical Case
Anesthesia: *Unknown

## 2013-09-25 SURGERY — LEFT HEART CATHETERIZATION WITH CORONARY ANGIOGRAM
Anesthesia: LOCAL

## 2013-09-25 MED ORDER — PANTOPRAZOLE SODIUM 40 MG PO TBEC
40.0000 mg | DELAYED_RELEASE_TABLET | Freq: Every day | ORAL | Status: DC
  Administered 2013-09-26: 40 mg via ORAL
  Filled 2013-09-25: qty 1

## 2013-09-25 MED ORDER — DIAZEPAM 5 MG PO TABS: 5.0000 mg | ORAL_TABLET | Freq: Three times a day (TID) | ORAL | Status: DC | PRN

## 2013-09-25 MED ORDER — MIDAZOLAM HCL 2 MG/2ML IJ SOLN
INTRAMUSCULAR | Status: AC
  Filled 2013-09-25: qty 2

## 2013-09-25 MED ORDER — NITROGLYCERIN 0.2 MG/ML ON CALL CATH LAB
INTRAVENOUS | Status: AC
  Filled 2013-09-25: qty 1

## 2013-09-25 MED ORDER — PANTOPRAZOLE SODIUM 40 MG IV SOLR
40.0000 mg | Freq: Once | INTRAVENOUS | Status: AC
  Administered 2013-09-25: 40 mg via INTRAVENOUS
  Filled 2013-09-25: qty 40

## 2013-09-25 MED ORDER — FENTANYL CITRATE 0.05 MG/ML IJ SOLN
INTRAMUSCULAR | Status: AC
  Filled 2013-09-25: qty 2

## 2013-09-25 MED ORDER — ACETAMINOPHEN 325 MG PO TABS: 650.0000 mg | ORAL_TABLET | ORAL | Status: DC | PRN

## 2013-09-25 MED ORDER — SODIUM CHLORIDE 0.9 % IV SOLN: INTRAVENOUS | Status: DC

## 2013-09-25 MED ORDER — ONDANSETRON HCL 4 MG/2ML IJ SOLN: 4.0000 mg | Freq: Four times a day (QID) | INTRAMUSCULAR | Status: DC | PRN

## 2013-09-25 MED ORDER — ONDANSETRON HCL 4 MG/2ML IJ SOLN
4.0000 mg | Freq: Once | INTRAMUSCULAR | Status: AC
  Administered 2013-09-25: 4 mg via INTRAVENOUS
  Filled 2013-09-25: qty 2

## 2013-09-25 MED ORDER — ZOLPIDEM TARTRATE 5 MG PO TABS: 5.0000 mg | ORAL_TABLET | Freq: Every evening | ORAL | Status: DC | PRN

## 2013-09-25 MED ORDER — HEPARIN SODIUM (PORCINE) 5000 UNIT/ML IJ SOLN: 4000.0000 [IU] | INTRAMUSCULAR | Status: DC

## 2013-09-25 MED ORDER — HEPARIN (PORCINE) IN NACL 2-0.9 UNIT/ML-% IJ SOLN
INTRAMUSCULAR | Status: AC
  Filled 2013-09-25: qty 1000

## 2013-09-25 MED ORDER — LIDOCAINE HCL (PF) 1 % IJ SOLN
INTRAMUSCULAR | Status: AC
  Filled 2013-09-25: qty 30

## 2013-09-25 NOTE — Progress Notes (Signed)
Chaplain responded to page for code stemi and checked in at ED station. Patient was with RN and physicians discussing semi procedure. Chaplain met with patient's friend, who was accompanying pharmacist to cath lab control room. Chaplain asked team to page if needed.  Maurene Capes, Chaplain

## 2013-09-25 NOTE — ED Notes (Signed)
Pt c/o emesis since eating salad with oil and vinegar yesterday.  Denies abd pain or diarrhea.  States problems with constipation and hx of an ulcer.  Hx of dark stools "a long time ago", but denies at this time.  He was able to eat oatmeal at 3 am, but since then he can not tolerate even water.

## 2013-09-25 NOTE — ED Notes (Signed)
Caleb Rivers at bedside and has all of the patients belongings including wallet, glasses.

## 2013-09-25 NOTE — ED Notes (Signed)
New ekg given to dr Denton Lank

## 2013-09-25 NOTE — CV Procedure (Signed)
Caleb Rivers is a 57 y.o. male    [id number]  [id number] LOCATION:  FACILITY: [facility]  PHYSICIAN: Lennette Bihari, MD, [facility] 07-20-1956   DATE OF PROCEDURE:  09/25/2013    [facility]   URGENT CARDIAC CATHETERIZATION     HISTORY:  [name] is a 57 year old male who is originally from [place]. He has a history of peptic ulcer disease. He developed nausea vomiting and some epigastric discomfort yesterday. He presented to the emergency room today where he was evaluated by Dr. Bebe Shaggy. The patient did not complain of chest tightness. However, subsequent ECG was done which showed ST elevation in 1 and ALT agent precordially and T wave inversion fairly. I was contacted as the Code STEMI physician. A repeat EKG showed slight increase in the precordial J-point elevation. After much discussion with the patient, patient's friend as well as an interpreter, perform cardiac catheterization in light of the EKG changes the patient was not given aspirin or heparin in the emergency room in the event this was GI mediated. He was noted to have brown stool but this is hemocult positive.   PROCEDURE:  The patient was brought to the second floor [place] [facility] cath lab in the postabsorptive state. He was premedicated with Versed 2 mg and fentanyl 50 mcg. His right groin was prepped and shaved in usual sterile fashion. Xylocaine 1% was used for local anesthesia. A 6 French sheath was inserted into the right femoral artery. Diagnostic catheterizatiion was done with 6 [place] LF4, FR4, and pigtail catheters. Left ventriculography was done with 24 cc Omnipaque contrast. Hemostasis was obtained by direct manual compression. The patient tolerated the procedure well.   HEMODYNAMICS:   Central Aorta: 130/77   Left Ventricle: 130/8  ANGIOGRAPHY:  1. Left main: Long normal vessel 2. LAD: Angiographically normal gave rise to a major diagonal several septal perforating  arteries and extends and wraps around the LV apex 3. Left circumflex: Normal and gave rise to 3 marginal vessels 4. Right coronary artery: Normal vessel  5.  Left ventriculography revealed normal global the contractility without definitive wall motion abnormalities. Ejection fraction is at least 55%    IMPRESSION:  Normal LV function  Normal coronary arteries    Lennette Bihari, MD, [facility] 09/25/2013 10:33 AM

## 2013-09-25 NOTE — ED Notes (Signed)
Dr Tresa Endo and Dr Diona Fanti of [place] at bedside.

## 2013-09-25 NOTE — H&P (Signed)
Patient ID: Khing Belcher MRN: [id number], DOB/AGE: 57-12-1955   Admit date: 09/25/2013   Primary Physician: No primary provider on file. Primary Cardiologist: Dr Tresa Endo (new)  HPI: 57 y/o African male from [place], here since 1999. No prior history of CAD. He was admitted through the ER with complaints of nausea and vomiting. EKG in the ER showed ant/lat ST elevation and Dr Tresa Endo was called. The pt denies chest pain. After a long discussion with the pt through an interpreter the pt agreed to have a cath. Dr Tresa Endo, the interpreter, RN, ER MD, and myself were present for this discussion.   Problem List: Past Medical History  Diagnosis Date  . Ulcer   . Constipation     History reviewed. No pertinent past surgical history.   Allergies:  Allergies  Allergen Reactions  . Asa [Aspirin] Other (See Comments)    Current and Hx of GI ulcer - per Pt and friend/translator  . Nsaids Other (See Comments)    Current and Hx of GI ulcer per pt and friend/translator     Home Medications Current Facility-Administered Medications  Medication Dose Route Frequency Provider Last Rate Last Dose  . 0.9 %  sodium chloride infusion   Intravenous Continuous Joya Gaskins, MD       Current Outpatient Prescriptions  Medication Sig Dispense Refill  . lansoprazole (PREVACID) 30 MG capsule Take 30 mg by mouth daily.         FMHX- negative for CAD  History   Social History  . Marital Status: Single    Spouse Name: N/A    Number of Children: N/A  . Years of Education: N/A   Occupational History  . Not on file.   Social History Main Topics  . Smoking status: Never Smoker   . Smokeless tobacco: Not on file  . Alcohol Use: No  . Drug Use: No  . Sexual Activity: Not on file   Other Topics Concern  . Not on file   Social History Narrative   Psychiatric nurse, not married.     Review of Systems: As noted in HPI. General: negative for chills, fever, night sweats or weight changes.   Cardiovascular: negative for chest pain, dyspnea on exertion, edema, orthopnea, palpitations, paroxysmal nocturnal dyspnea or shortness of breath Dermatological: negative for rash Respiratory: negative for cough or wheezing Urologic: negative for hematuria Neurologic: negative for visual changes, syncope, or dizziness All other systems reviewed and are otherwise negative except as noted above.  Physical Exam: Blood pressure 157/84, pulse 83, temperature 98 F (36.7 C), temperature source Oral, resp. rate 18, weight 176 lb 5.9 oz (80 kg), SpO2 100.00%.  General appearance: alert, cooperative and no distress Neck: no carotid bruit and no JVD Lungs: clear to auscultation bilaterally Heart: regular rate and rhythm Abdomen: soft, non-tender; bowel sounds normal; no masses,  no organomegaly Extremities: extremities normal, atraumatic, no cyanosis or edema Pulses: 2+ and symmetric Skin: Skin color, texture, turgor normal. No rashes or lesions Neurologic: Grossly normal    Labs:   Results for orders placed during the hospital encounter of 09/25/13 (from the past 24 hour(s))  CBC WITH DIFFERENTIAL     Status: None   Collection Time    09/25/13  8:00 AM      Result Value Range   WBC 7.4  4.0 - 10.5 K/uL   RBC 5.14  4.22 - 5.81 MIL/uL   Hemoglobin 16.3  13.0 - 17.0 g/dL   HCT 40.9  81.1 - 91.4 %  MCV 91.2  78.0 - 100.0 fL   MCH 31.7  26.0 - 34.0 pg   MCHC 34.8  30.0 - 36.0 g/dL   RDW 81.1  91.4 - 78.2 %   Platelets 206  150 - 400 K/uL   Neutrophils Relative % 65  43 - 77 %   Neutro Abs 4.8  1.7 - 7.7 K/uL   Lymphocytes Relative 25  12 - 46 %   Lymphs Abs 1.8  0.7 - 4.0 K/uL   Monocytes Relative 9  3 - 12 %   Monocytes Absolute 0.7  0.1 - 1.0 K/uL   Eosinophils Relative 1  0 - 5 %   Eosinophils Absolute 0.1  0.0 - 0.7 K/uL   Basophils Relative 0  0 - 1 %   Basophils Absolute 0.0  0.0 - 0.1 K/uL  COMPREHENSIVE METABOLIC PANEL     Status: Abnormal   Collection Time    09/25/13   8:00 AM      Result Value Range   Sodium 135  135 - 145 mEq/L   Potassium 3.9  3.5 - 5.1 mEq/L   Chloride 100  96 - 112 mEq/L   CO2 24  19 - 32 mEq/L   Glucose, Bld 100 (*) 70 - 99 mg/dL   BUN 17  6 - 23 mg/dL   Creatinine, Ser 9.56  0.50 - 1.35 mg/dL   Calcium 9.5  8.4 - 21.3 mg/dL   Total Protein 8.3  6.0 - 8.3 g/dL   Albumin 4.0  3.5 - 5.2 g/dL   AST 43 (*) 0 - 37 U/L   ALT 48  0 - 53 U/L   Alkaline Phosphatase 77  39 - 117 U/L   Total Bilirubin 0.4  0.3 - 1.2 mg/dL   GFR calc non Af Amer >90  >90 mL/min   GFR calc Af Amer >90  >90 mL/min  LIPASE, BLOOD     Status: None   Collection Time    09/25/13  8:00 AM      Result Value Range   Lipase 28  11 - 59 U/L  URINALYSIS, ROUTINE W REFLEX MICROSCOPIC     Status: None   Collection Time    09/25/13  8:00 AM      Result Value Range   Color, Urine YELLOW  YELLOW   APPearance CLEAR  CLEAR   Specific Gravity, Urine 1.018  1.005 - 1.030   pH 6.0  5.0 - 8.0   Glucose, UA NEGATIVE  NEGATIVE mg/dL   Hgb urine dipstick NEGATIVE  NEGATIVE   Bilirubin Urine NEGATIVE  NEGATIVE   Ketones, ur NEGATIVE  NEGATIVE mg/dL   Protein, ur NEGATIVE  NEGATIVE mg/dL   Urobilinogen, UA 0.2  0.0 - 1.0 mg/dL   Nitrite NEGATIVE  NEGATIVE   Leukocytes, UA NEGATIVE  NEGATIVE  POCT I-STAT TROPONIN I     Status: None   Collection Time    09/25/13  8:56 AM      Result Value Range   Troponin i, poc 0.00  0.00 - 0.08 ng/mL   Comment 3           OCCULT BLOOD, POC DEVICE     Status: Abnormal   Collection Time    09/25/13  8:57 AM      Result Value Range   Fecal Occult Bld POSITIVE (*) NEGATIVE     Radiology/Studies: No results found.  EKG:NSR ST elevation in 1, AVL, V2-3 with T wave inversion III  ASSESSMENT AND PLAN:  Principal Problem:   STEMI (ST elevation myocardial infarction)- by ER EKG, negative Troponin Active Problems:   Gastritis   PLAN:  Pt agreed to cath. No ASA or Heparin because of concern for GI issues- his stool was brown but  guiac positive, Hgb stable- 16.3.   Deland Pretty, PA-C 09/25/2013, 10:03 AM   Patient seen and examined. Agree with assessment and plan. 57 yo [place] male originally from [place]. He has a h/o PUD. He has noticed recent n, V and epigastric sensation. ECG in ER raised concern for STEM with ST elevation in I avl, Jp elevation in precordial leads and T wave inversion in III. Initial Troponin negative; stoll brown but trace hemoccult positive.  After much discussion in ER with patient , friend and interpreter will proceed with definitive cath. No ASA or heparin administed in ER in event of GI mediated and not an ACS.   Lennette Bihari, MD, [facility] 09/25/2013 10:28 AM

## 2013-09-25 NOTE — ED Provider Notes (Signed)
CSN: [id number]     Arrival date & time 09/25/13  4098 History   First MD Initiated Contact with Patient 09/25/13 [phone]     Chief Complaint  Patient presents with  . Emesis   Patient is a 57 y.o. male presenting with vomiting. The history is provided by the patient. A language interpreter was used [phone] -french).  Emesis Severity:  Moderate Duration:  1 day Timing:  Intermittent Progression:  Worsening Chronicity:  New Recent urination:  Increased Relieved by:  Nothing Worsened by:  Nothing tried Associated symptoms: no abdominal pain, no diarrhea and no fever   pt reports he has been vomiting since yesterday after eating salad with oil and vinegar Denies abd pain Denies CP He denies blood in vomitus He reports that even water triggers his vomiting He reports h/o ulcer but no other history reported  His primary language is french but he also speaks [place]  Past Medical History  Diagnosis Date  . Ulcer   . Constipation    History reviewed. No pertinent past surgical history. No family history on file. History  Substance Use Topics  . Smoking status: Never Smoker   . Smokeless tobacco: Not on file  . Alcohol Use: No    Review of Systems  Constitutional: Negative for fever.  Cardiovascular: Negative for chest pain.  Gastrointestinal: Positive for vomiting. Negative for abdominal pain and diarrhea.  Genitourinary: Positive for frequency.  Neurological: Negative for weakness.  All other systems reviewed and are negative.    Allergies  Asa and Nsaids  Home Medications   Current Outpatient Rx  Name  Route  Sig  Dispense  Refill  . aspirin 81 MG tablet   Oral   Take 81 mg by mouth daily.          BP 157/84  Pulse 83  Temp(Src) 98 F (36.7 C) (Oral)  Resp 18  SpO2 100% Physical Exam CONSTITUTIONAL: Well developed/well nourished HEAD: Normocephalic/atraumatic EYES: EOMI/PERRL ENMT: Mucous membranes moist NECK: supple no meningeal signs SPINE:entire  spine nontender CV: S1/S2 noted, no murmurs/rubs/gallops noted LUNGS: Lungs are clear to auscultation bilaterally, no apparent distress ABDOMEN: soft, nontender, no rebound or guarding GU:no cva tenderness Rectal - stool color brown.  No melena or blood noted.  No rectal mass.  Chaperone present NEURO: Pt is awake/alert, moves all extremitiesx4, pt is ambulatory without difficulty EXTREMITIES: pulses normal, full ROM SKIN: warm, color normal PSYCH: no abnormalities of mood noted  ED Course  Procedures  CRITICAL CARE Performed by: Joya Gaskins Total critical care time: 37 Critical care time was exclusive of separately billable procedures and treating other patients. Critical care was necessary to treat or prevent imminent or life-threatening deterioration. Critical care was time spent personally by me on the following activities: development of treatment plan with patient and/or surrogate as well as nursing, discussions with consultants, evaluation of patient's response to treatment, examination of patient, obtaining history from patient or surrogate, ordering and performing treatments and interventions, ordering and review of laboratory studies, ordering and review of radiographic studies, pulse oximetry and re-evaluation of patient's condition.  Labs Review Labs Reviewed  CBC WITH DIFFERENTIAL  COMPREHENSIVE METABOLIC PANEL  LIPASE, BLOOD  URINALYSIS, ROUTINE W REFLEX MICROSCOPIC  7:59 AM Pt presents with vomiting for one day He is in no distress.   Will check labs/ekg, treat nausea and reassess 8:42 AM Pt continues to deny chest pain His EKG is abnormal, with ST elevation (reviewed at 0836am) He is in no distress.  His story does not match with acute MI I have paged the cardiology attending on call to review EKG and review the case 8:49 AM D/w dr Tresa Endo, he will review EKG and call me back 9:01 AM D/w dr Tresa Endo.  Given his concerning EKG (though pt denied chest pain and  story not classic for STEMI) Will call code stemi 9:09 AM To ensure there is no confusion, phone interpreter in french used (pt also speaks english) Dr Tresa Endo at bedside Pt now reports he had recent black stool (he initially reported it was months ago) On my evaluation, his stool was not black or bloody.  Will hold heparin for now He has an allergy to ASA (will not give for now) 9:38 AM After long discussion with cardiologist dr Tresa Endo, pt agrees to have cardiac cath His friend who speaks his native language as well as french assisted with communication (phone interpreter used as well)  MDM   Nursing notes including past medical history and social history reviewed and considered in documentation Labs/vital reviewed and considered Previous records reviewed and considered - h/o ulcer per chart    Date: 09/25/2013 (reviewed at 0836am)  Rate: 74  Rhythm: normal sinus rhythm  QRS Axis: normal  Intervals: normal  ST/T Wave abnormalities: ST elevations laterally, st depression inferiorly  Conduction Disutrbances:none  Narrative Interpretation:   Old EKG Reviewed: none available at time of interpretation    Joya Gaskins, MD 09/25/13 [phone]

## 2013-09-26 DIAGNOSIS — K2970 Gastritis, unspecified, without bleeding: Secondary | ICD-10-CM

## 2013-09-26 DIAGNOSIS — I219 Acute myocardial infarction, unspecified: Secondary | ICD-10-CM

## 2013-09-26 DIAGNOSIS — R9431 Abnormal electrocardiogram [ECG] [EKG]: Secondary | ICD-10-CM | POA: Diagnosis present

## 2013-09-26 LAB — CBC
HCT: 45.2 % (ref 39.0–52.0)
Hemoglobin: 16.1 g/dL (ref 13.0–17.0)
MCH: 32.5 pg (ref 26.0–34.0)
MCHC: 35.6 g/dL (ref 30.0–36.0)
MCV: 91.1 fL (ref 78.0–100.0)
Platelets: 211 10*3/uL (ref 150–400)
RBC: 4.96 MIL/uL (ref 4.22–5.81)
RDW: 14.3 % (ref 11.5–15.5)
WBC: 8.3 10*3/uL (ref 4.0–10.5)

## 2013-09-26 LAB — BASIC METABOLIC PANEL
BUN: 16 mg/dL (ref 6–23)
CO2: 23 mEq/L (ref 19–32)
Calcium: 9.2 mg/dL (ref 8.4–10.5)
Chloride: 101 mEq/L (ref 96–112)
Creatinine, Ser: 1.04 mg/dL (ref 0.50–1.35)
GFR calc Af Amer: >90 mL/min (ref >90)
GFR calc non Af Amer: 78 mL/min — ABNORMAL LOW (ref >90)
Glucose, Bld: 86 mg/dL (ref 70–99)
Potassium: 4 mEq/L (ref 3.5–5.1)
Sodium: 136 mEq/L (ref 135–145)

## 2013-09-26 NOTE — Progress Notes (Signed)
Discharge instructions given to pt along with note to excuse him from work the past day. Pt verbalized understanding of discharge instructions. Pt is stable for discharge.

## 2013-09-26 NOTE — Discharge Summary (Addendum)
Physician Discharge Summary  Patient ID: Caleb Rivers MRN: [id number] DOB/AGE: January 26, 1956 57 y.o.  Admit date: 09/25/2013 Discharge date: 09/26/2013  Admission Diagnoses: Possible STEMI (by ER EKG), negative Troponin, Negative Cath  Discharge Diagnoses:  Active Problems:   Abnormal resting ECG findings - repolarization changes; NOT STEMI   Gastritis  Discharged Condition: stable  Hospital Course: [name] is a 57 year old male who is originally from [place]. He has a history of peptic ulcer disease. He developed nausea, vomiting and some epigastric discomfort on 09/24/13. He presented to the emergency room, a day later, on 09/25/13 where he was evaluated by Dr. Bebe Shaggy. The patient did not complain of chest tightness. However, subsequent EKG was done which showed ST elevation in 1 and ALT agent precordially and T wave inversion inferiorly. Code STEMI was activated. Dr. Tresa Endo repeated and EKG which showed slight increase in the precordial J-point elevation. It was then decided to proceed with cardiac catheretization. The patient was not given aspirin or heparin in the emergency room, in the event this was GI mediated. He was noted to have brown stool but this is hemocult positive. The cath was performed by Dr. Tresa Endo, via the right femoral artery. He was found to have normal coronary arteries and normal LV function, with an EF of 55%. His pain was felt to be non cardiac in nature. It was felt to be possible GI related, given his history of PUD. He left the cath lab in stable condition. He was transferred to telemetry and started on PPI therapy. The patient reported resolution of his epigastric pain after producing a bowel movement. On the first day post cath, the patient noted that he felt his problem to be related to constipation. He denied any melena or hematochezia. His H/H was WNL at 16/46. His right groin remained stable, free from hematoma and bruit. He had no further pain. He was last  seen and examined by Dr. Herbie Baltimore, who determined that he was stable for discharge home. Case Management was consulted to assist in establishing the patient with a Primary Care Provider. The patient was discharge home on Prevacid and instructed to follow-up with his new PCP.   Consults: None  Significant Diagnostic Studies:   LHC 09/25/13 HEMODYNAMICS:  Central Aorta: 130/77  Left Ventricle: 130/8  ANGIOGRAPHY:  1. Left main: Long normal vessel  2. LAD: Angiographically normal gave rise to a major diagonal several septal perforating arteries and extends and wraps around the LV apex  3. Left circumflex: Normal and gave rise to 3 marginal vessels  4. Right coronary artery: Normal vessel  5. Left ventriculography revealed normal global the contractility without definitive wall motion abnormalities. Ejection fraction is at least 55%  IMPRESSION:  Normal LV function  Normal coronary arteries    Treatments: See Hospital Course  Discharge Exam: Blood pressure 151/82, pulse 85, temperature 98.7 F (37.1 C), temperature source Oral, resp. rate 18, weight 176 lb 5.9 oz (80 kg), SpO2 100.00%.   Disposition: 01-Home or Self Care     Medication List         lansoprazole 30 MG capsule  Commonly known as:  PREVACID  Take 30 mg by mouth daily.       TIME SPENT ON DISCHARGE, INCLUDING PHYSICIAN TIME: > 30 MINUTES  Signed: Allayne Butcher, PA-C 09/26/2013, 6:33 PM  Pt seen& examined this AM.  Felt much better after having a BM last PM. Angiography was unrevealing.    Stable for d/c home.  Needs PCP  f/u.   Agree with d/c summary  Caleb Rivers, M.D., M.S. [facility] [address] [place], [place]  [address]  [phone] Pager # [phone] 09/26/2013 6:33 PM

## 2013-09-26 NOTE — Progress Notes (Signed)
[facility]  Subjective: Feeling much better. He states he thinks his main problem was constipation. He feels much improved after having a bowel movement last PM. He denies any mid epigastric/abdominal pain. No groin, back or flank pain.  Objective: Vital signs in last 24 hours: Temp:  [98.4 F (36.9 C)-98.7 F (37.1 C)] 98.7 F (37.1 C) (09/29 0434) Pulse Rate:  [67-85] 85 (09/29 0434) Resp:  [18] 18 (09/29 0434) BP: (111-152)/(74-90) 151/82 mmHg (09/29 0434) SpO2:  [94 %-100 %] 100 % (09/29 0434) Last BM Date: 09/25/13  Intake/Output from previous day: 09/28 0701 - 09/29 0700 In: -  Out: 425 [Urine:425] Intake/Output this shift: Total I/O In: 360 [P.O.:360] Out: -   Medications Current Facility-Administered Medications  Medication Dose Route Frequency Provider Last Rate Last Dose  . 0.9 %  sodium chloride infusion   Intravenous Continuous Joya Gaskins, MD      . 0.9 %  sodium chloride infusion   Intravenous Continuous Lennette Bihari, MD      . acetaminophen (TYLENOL) tablet 650 mg  650 mg Oral Q4H PRN Lennette Bihari, MD      . diazepam (VALIUM) tablet 5 mg  5 mg Oral Q8H PRN Lennette Bihari, MD      . ondansetron [facility]) injection 4 mg  4 mg Intravenous Q6H PRN Lennette Bihari, MD      . pantoprazole (PROTONIX) EC tablet 40 mg  40 mg Oral Q0600 Lennette Bihari, MD   40 mg at 09/26/13 1610  . zolpidem (AMBIEN) tablet 5 mg  5 mg Oral QHS PRN Abelino Derrick, PA-C        PE: General appearance: alert, cooperative and no distress Lungs: clear to auscultation bilaterally Heart: regular rate and rhythm, S1, S2 normal, no murmur, click, rub or gallop Extremities: no LEE Pulses: 2+ and symmetric Skin: warm and dry Neurologic: Grossly normal  Lab Results:   Recent Labs  09/25/13 0800 09/26/13 0430  WBC 7.4 8.3  HGB 16.3 16.1  HCT 46.9 45.2  PLT 206 211   BMET  Recent Labs  09/25/13 0800 09/26/13 0430  NA 135 136  K 3.9 4.0   CL 100 101  CO2 24 23  GLUCOSE 100* 86  BUN 17 16  CREATININE 0.90 1.04  CALCIUM 9.5 9.2   PT/INR  Recent Labs  09/25/13 0943  LABPROT 12.8  INR 0.98   Studies/Results: LHC 09/25/13  HEMODYNAMICS:  Central Aorta: 130/77  Left Ventricle: 130/8  ANGIOGRAPHY:  1. Left main: Long normal vessel  2. LAD: Angiographically normal gave rise to a major diagonal several septal perforating arteries and extends and wraps around the LV apex  3. Left circumflex: Normal and gave rise to 3 marginal vessels  4. Right coronary artery: Normal vessel  5. Left ventriculography revealed normal global the contractility without definitive wall motion abnormalities. Ejection fraction is at least 55%  IMPRESSION:  Normal LV function  Normal coronary arteries   Assessment/Plan    Principal Problem:   STEMI (ST elevation myocardial infarction)- by ER EKG, negative Troponin; Negative Cath Active Problems:   Gastritis  Plan: S/p diagnostic LHC yesterday, revealing normal coronaries, normal EF and no WMA.  H/o PUD. Protonix was stated yesterday and patient endorses significant improvement. No further pain. He notes dark stools but states that he takes iron supplements at home. He feels that his main issue was constipation. His symptoms were relived after having a BM  last PM. Will recommend daily stool softener with Iron supplements to avoid constipation. His FOBT was positive, however H/H is WNL at 16.1/45.2. I don't think he will require inpatient GI work-up. Will recommend f/u w/ Dr. Christella Hartigan as an OP. Can likely go home today w/ PPI. Will encourage to avoid NSAIDS.    LOS: 1 day    Brittainy M. Sharol Harness, PA-C 09/26/2013 11:02 AM  I have seen & examined the patient.  He feels great today after BM.   I agree that he is OK for d/c today -- He actually does not have a PCP (? He does not know a Dr. Christella Hartigan).  Will ask CM to assist with finding a PCP & consider GI w/u.  ? [employer] as an  option.  Normal Coronary anatomy.  No active cardiac issues.  Marykay Lex, MD

## 2013-09-26 NOTE — Care Management Note (Signed)
    Page 1 of 1   09/26/2013     5:13:42 PM   CARE MANAGEMENT NOTE 09/26/2013  Patient:  Caleb Rivers, Caleb Rivers   Account Number:  [id number]  Date Initiated:  09/26/2013  Documentation initiated by:  Lisia Westbay  Subjective/Objective Assessment:   PT ADM ON 09/25/13 WITH N/V, S-T ELEVATION.  PTA, PT LIVES ALONE AND IS INDEPENDENT.     Action/Plan:   CM REFERRAL PLACED, AS PT HAS NO PCP.  PT GIVEN PHONE # FOR [facility]FERRAL SERVICE.  HE VERB UNDERSTANDING TO CALL # TO ASSIST WITH FINDING PCP.   Anticipated DC Date:  09/26/2013   Anticipated DC Plan:  HOME/SELF CARE      DC Planning Services  CM consult  PCP issues      Choice offered to / List presented to:             Status of service:  Completed, signed off Medicare Important Message given?   (If response is "NO", the following Medicare IM given date fields will be blank) Date Medicare IM given:   Date Additional Medicare IM given:    Discharge Disposition:  HOME/SELF CARE  Per UR Regulation:  Reviewed for med. necessity/level of care/duration of stay  If discussed at Long Length of Stay Meetings, dates discussed:    Comments:

## 2013-09-27 ENCOUNTER — Encounter: Payer: Self-pay | Admitting: Cardiovascular Disease

## 2014-06-23 ENCOUNTER — Emergency Department (HOSPITAL_COMMUNITY)
Admission: EM | Admit: 2014-06-23 | Discharge: 2014-06-23 | Disposition: A | Discharge status: Home / Self Care | Payer: BC Managed Care – PPO | Attending: Emergency Medicine | Admitting: Emergency Medicine

## 2014-06-23 ENCOUNTER — Encounter (HOSPITAL_COMMUNITY): Payer: Self-pay | Admitting: Emergency Medicine

## 2014-06-23 DIAGNOSIS — IMO0002 Reserved for concepts with insufficient information to code with codable children: Secondary | ICD-10-CM | POA: Insufficient documentation

## 2014-06-23 DIAGNOSIS — M546 Pain in thoracic spine: Secondary | ICD-10-CM

## 2014-06-23 DIAGNOSIS — L98499 Non-pressure chronic ulcer of skin of other sites with unspecified severity: Secondary | ICD-10-CM | POA: Insufficient documentation

## 2014-06-23 DIAGNOSIS — Y9389 Activity, other specified: Secondary | ICD-10-CM | POA: Insufficient documentation

## 2014-06-23 DIAGNOSIS — M545 Low back pain, unspecified: Secondary | ICD-10-CM

## 2014-06-23 DIAGNOSIS — Y92410 Unspecified street and highway as the place of occurrence of the external cause: Secondary | ICD-10-CM | POA: Insufficient documentation

## 2014-06-23 MED ORDER — CYCLOBENZAPRINE HCL 10 MG PO TABS: 10.0000 mg | ORAL_TABLET | Freq: Two times a day (BID) | ORAL | Status: DC | PRN
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
## 2014-06-23 NOTE — ED Notes (Signed)
Declined W/C at D/C and was escorted to lobby by RN. 

## 2014-07-02 NOTE — ED Provider Notes (Signed)
Medical screening examination/treatment/procedure(s) were performed by non-physician practitioner and as supervising physician I was immediately available for consultation/collaboration.   EKG Interpretation None       Donnetta HutchingBrian Cook, MD 07/02/14 2041

## 2014-08-30 ENCOUNTER — Emergency Department (HOSPITAL_COMMUNITY): Payer: Self-pay

## 2014-08-30 ENCOUNTER — Inpatient Hospital Stay (HOSPITAL_COMMUNITY)
Admission: EM | Admit: 2014-08-30 | Discharge: 2014-09-04 | DRG: 390 | Disposition: A | Discharge status: Home / Self Care | Payer: BC Managed Care – PPO | Attending: Internal Medicine | Admitting: Internal Medicine

## 2014-08-30 ENCOUNTER — Encounter (HOSPITAL_COMMUNITY): Payer: Self-pay | Admitting: Emergency Medicine

## 2014-08-30 DIAGNOSIS — R9431 Abnormal electrocardiogram [ECG] [EKG]: Secondary | ICD-10-CM

## 2014-08-30 DIAGNOSIS — R112 Nausea with vomiting, unspecified: Secondary | ICD-10-CM | POA: Diagnosis present

## 2014-08-30 DIAGNOSIS — R1084 Generalized abdominal pain: Secondary | ICD-10-CM | POA: Diagnosis present

## 2014-08-30 DIAGNOSIS — K2970 Gastritis, unspecified, without bleeding: Secondary | ICD-10-CM

## 2014-08-30 DIAGNOSIS — K56609 Unspecified intestinal obstruction, unspecified as to partial versus complete obstruction: Principal | ICD-10-CM | POA: Diagnosis present

## 2014-08-30 DIAGNOSIS — K279 Peptic ulcer, site unspecified, unspecified as acute or chronic, without hemorrhage or perforation: Secondary | ICD-10-CM | POA: Diagnosis present

## 2014-08-30 DIAGNOSIS — R1110 Vomiting, unspecified: Secondary | ICD-10-CM

## 2014-08-30 LAB — CBC WITH DIFFERENTIAL/PLATELET
BASOS ABS: 0 10*3/uL (ref 0.0–0.1)
BASOS PCT: 0 % (ref 0–1)
EOS ABS: 0 10*3/uL (ref 0.0–0.7)
EOS PCT: 0 % (ref 0–5)
HEMATOCRIT: 51.6 % (ref 39.0–52.0)
Hemoglobin: 17.9 g/dL — ABNORMAL HIGH (ref 13.0–17.0)
LYMPHS PCT: 14 % (ref 12–46)
Lymphs Abs: 1.9 10*3/uL (ref 0.7–4.0)
MCH: 31.4 pg (ref 26.0–34.0)
MCHC: 34.7 g/dL (ref 30.0–36.0)
MCV: 90.5 fL (ref 78.0–100.0)
MONO ABS: 0.6 10*3/uL (ref 0.1–1.0)
Monocytes Relative: 5 % (ref 3–12)
Neutro Abs: 11.1 10*3/uL — ABNORMAL HIGH (ref 1.7–7.7)
Neutrophils Relative %: 81 % — ABNORMAL HIGH (ref 43–77)
Platelets: 228 10*3/uL (ref 150–400)
RBC: 5.7 MIL/uL (ref 4.22–5.81)
RDW: 14.9 % (ref 11.5–15.5)
WBC: 13.7 10*3/uL — ABNORMAL HIGH (ref 4.0–10.5)

## 2014-08-30 LAB — URINALYSIS, ROUTINE W REFLEX MICROSCOPIC
BILIRUBIN URINE: NEGATIVE
Glucose, UA: NEGATIVE mg/dL
KETONES UR: NEGATIVE mg/dL
LEUKOCYTES UA: NEGATIVE
NITRITE: NEGATIVE
PROTEIN: 30 mg/dL — AB
Specific Gravity, Urine: 1.028 (ref 1.005–1.030)
UROBILINOGEN UA: 0.2 mg/dL (ref 0.0–1.0)
pH: 5.5 (ref 5.0–8.0)

## 2014-08-30 LAB — BASIC METABOLIC PANEL
Anion gap: 17 — ABNORMAL HIGH (ref 5–15)
BUN: 22 mg/dL (ref 6–23)
CALCIUM: 10.3 mg/dL (ref 8.4–10.5)
CO2: 23 meq/L (ref 19–32)
CREATININE: 0.94 mg/dL (ref 0.50–1.35)
Chloride: 99 mEq/L (ref 96–112)
GFR calc Af Amer: >90 mL/min (ref >90)
GLUCOSE: 120 mg/dL — AB (ref 70–99)
Potassium: 4.3 mEq/L (ref 3.7–5.3)
Sodium: 139 mEq/L (ref 137–147)

## 2014-08-30 LAB — HEPATIC FUNCTION PANEL
ALT: 51 U/L (ref 0–53)
AST: 42 U/L — ABNORMAL HIGH (ref 0–37)
Albumin: 4.5 g/dL (ref 3.5–5.2)
Alkaline Phosphatase: 88 U/L (ref 39–117)
Total Bilirubin: 0.5 mg/dL (ref 0.3–1.2)
Total Protein: 9.3 g/dL — ABNORMAL HIGH (ref 6.0–8.3)

## 2014-08-30 LAB — URINE MICROSCOPIC-ADD ON: Urine-Other: NONE SEEN

## 2014-08-30 LAB — POC OCCULT BLOOD, ED: FECAL OCCULT BLD: NEGATIVE

## 2014-08-30 MED ORDER — HYDROMORPHONE HCL PF 1 MG/ML IJ SOLN
0.5000 mg | Freq: Once | INTRAMUSCULAR | Status: AC
  Administered 2014-08-30: 0.5 mg via INTRAVENOUS
  Filled 2014-08-30: qty 1

## 2014-08-30 MED ORDER — PANTOPRAZOLE SODIUM 40 MG IV SOLR
40.0000 mg | Freq: Two times a day (BID) | INTRAVENOUS | Status: DC
  Administered 2014-08-31 – 2014-09-03 (×9): 40 mg via INTRAVENOUS
  Filled 2014-08-30 (×11): qty 40

## 2014-08-30 MED ORDER — MORPHINE SULFATE 4 MG/ML IJ SOLN
4.0000 mg | Freq: Once | INTRAMUSCULAR | Status: AC
Start: 2014-08-30 — End: 2014-08-30
  Administered 2014-08-30: 4 mg via INTRAVENOUS
  Filled 2014-08-30: qty 1

## 2014-08-30 MED ORDER — SODIUM CHLORIDE 0.9 % IV BOLUS (SEPSIS)
1000.0000 mL | Freq: Once | INTRAVENOUS | Status: AC
  Administered 2014-08-30: 1000 mL via INTRAVENOUS

## 2014-08-30 MED ORDER — IOHEXOL 300 MG/ML  SOLN
50.0000 mL | Freq: Once | INTRAMUSCULAR | Status: AC | PRN
  Administered 2014-08-30: 50 mL via ORAL

## 2014-08-30 MED ORDER — IOHEXOL 300 MG/ML  SOLN
100.0000 mL | Freq: Once | INTRAMUSCULAR | Status: AC | PRN
  Administered 2014-08-30: 100 mL via INTRAVENOUS

## 2014-08-30 MED ORDER — ONDANSETRON HCL 4 MG/2ML IJ SOLN
4.0000 mg | Freq: Once | INTRAMUSCULAR | Status: AC
  Administered 2014-08-30: 4 mg via INTRAVENOUS
  Filled 2014-08-30: qty 2

## 2014-08-30 NOTE — ED Notes (Addendum)
Contact daughter(name [name]) on [phone] with admission details.

## 2014-08-30 NOTE — ED Provider Notes (Signed)
CSN: [id number]     Arrival date & time 08/30/14  1623 History   First MD Initiated Contact with Patient 08/30/14 1933     Chief Complaint  Patient presents with  . Abdominal Pain  . Rectal Bleeding     (Consider location/radiation/quality/duration/timing/severity/associated sxs/prior Treatment) The history is provided by the patient.  Isaack Preble is a 58 y.o. male hx of stomach ulcer, constipation here with abdominal pain, vomiting, blood in stool. Has been having right sided abdominal pain and epigastric pain since this morning. Crampy, intermittent pain. Associated with some vomiting. Also had an episode of blood in stool.    Translation provided by friend   Past Medical History  Diagnosis Date  . Ulcer   . Constipation    History reviewed. No pertinent past surgical history. No family history on file. History  Substance Use Topics  . Smoking status: Never Smoker   . Smokeless tobacco: Not on file  . Alcohol Use: No    Review of Systems  Gastrointestinal: Positive for abdominal pain, blood in stool and hematochezia.  All other systems reviewed and are negative.     Allergies  Asa; Nsaids; and Peanut-containing drug products  Home Medications   Prior to Admission medications   Medication Sig Start Date End Date Taking? Authorizing Provider  lansoprazole (PREVACID SOLUTAB) 15 MG disintegrating tablet Take 15 mg by mouth daily at 12 noon.   Yes Historical Provider, MD  Multiple Vitamin (MULTIVITAMIN WITH MINERALS) TABS tablet Take 1 tablet by mouth daily.   Yes Historical Provider, MD   BP 147/77  Pulse 84  Temp(Src) 98.7 F (37.1 C) (Oral)  Resp 16  SpO2 98% Physical Exam  Nursing note and vitals reviewed. Constitutional: He is oriented to person, place, and time.  Dehydrated, slightly uncomfortable   HENT:  Head: Normocephalic.  MM slightly dry   Eyes: Conjunctivae are normal. Pupils are equal, round, and reactive to light.  Neck: Normal range of  motion. Neck supple.  Cardiovascular: Normal rate, regular rhythm and normal heart sounds.   Pulmonary/Chest: Effort normal and breath sounds normal. No respiratory distress. He has no wheezes. He has no rales.  Abdominal:  Distended, tympanic, mild tenderness worse RLQ and LLQ   Genitourinary:  Brown stool, no obvious hemorrhoids   Musculoskeletal: Normal range of motion. He exhibits no edema and no tenderness.  Neurological: He is alert and oriented to person, place, and time. No cranial nerve deficit. Coordination normal.  Skin: Skin is warm and dry.  Psychiatric: He has a normal mood and affect. His behavior is normal. Thought content normal.    ED Course  Procedures (including critical care time) Labs Review Labs Reviewed  BASIC METABOLIC PANEL - Abnormal; Notable for the following:    Glucose, Bld 120 (*)    Anion gap 17 (*)    All other components within normal limits  CBC WITH DIFFERENTIAL - Abnormal; Notable for the following:    WBC 13.7 (*)    Hemoglobin 17.9 (*)    Neutrophils Relative % 81 (*)    Neutro Abs 11.1 (*)    All other components within normal limits  URINALYSIS, ROUTINE W REFLEX MICROSCOPIC - Abnormal; Notable for the following:    Hgb urine dipstick TRACE (*)    Protein, ur 30 (*)    All other components within normal limits  HEPATIC FUNCTION PANEL - Abnormal; Notable for the following:    Total Protein 9.3 (*)    AST 42 (*)  All other components within normal limits  URINE MICROSCOPIC-ADD ON  POC OCCULT BLOOD, ED    Imaging Review Ct Abdomen Pelvis W Contrast  08/30/2014   CLINICAL DATA:  Mid abdominal pain, rectal bleeding.  EXAM: CT ABDOMEN AND PELVIS WITH CONTRAST  TECHNIQUE: Multidetector CT imaging of the abdomen and pelvis was performed using the standard protocol following bolus administration of intravenous contrast.  CONTRAST:  50mL OMNIPAQUE IOHEXOL 300 MG/ML SOLN, OMNIPAQUE IOHEXOL 300 MG/ML SOLN  COMPARISON:  03/13/2012  FINDINGS:  Lung bases clear.  Normal heart size.  No appreciable abnormality of the liver, spleen, pancreas, biliary system, adrenal glands, kidneys. No hydroureteronephrosis.  Colon is decompressed as are distal small bowel loops. Dilated proximal small bowel loops up to 3.5 cm with air-fluid levels to the level of the fecalized segment in the left lower quadrant and abrupt transition at a kinked segment (coronal image 66). Small mild interloop fluid and perihepatic fluid. No free intraperitoneal air. Mild mesenteric edema. No lymphadenopathy.  Normal caliber aorta and branch vessels.  Thin walled bladder.  Normal size prostate gland.  Mild multilevel degenerative change without acute osseous finding.  IMPRESSION: Small bowel obstruction pattern with left lower abdominal transition. Recommend surgical consultation.  Small amount of free intraperitoneal fluid is nonspecific and may be reactive.  Critical Value/emergent results were called by telephone at the time of interpretation on 08/30/2014 at 10:27 pm to Dr. Chaney Malling , who verbally acknowledged these results.   Electronically Signed   By: Jearld Lesch M.D.   On: 08/30/2014 22:28     EKG Interpretation None      MDM   Final diagnoses:  None    Nicolis Ragle is a 58 y.o. male here with ab pain, distention, possible rectal bleed. Will get labs, CT ab/pel. Occ neg.   10:44 PM CT showed SBO. Given IVF. Not actively vomiting so I held off NG tube. Will consult consult and admit to medicine.      Richardean Canal, MD 08/30/14 [phone]

## 2014-08-30 NOTE — H&P (Signed)
[facility] Admission History and Physical       Caleb Rivers ZOX:[id number] DOB: 29-Jul-1956 DOA: 08/30/2014  Referring physician:  EDP PCP: No primary provider on file.  Specialists:   Chief Complaint: ABD Pain  HPI: Caleb Rivers is a 58 y.o. male with a history of PUD who presents to the ED with complaints of 10/10 ABD pain and nausea and vomiting since the AM. He denies any fever or chills, He denies having any diarrhea, but he does report seeing blood in his stool and he denies having any hematemesis.   He was found to have a SBO on Ct scan of his ABD and was referred for medical admission,  General Surgery Dr Daphine Deutscher was consulted by the EDP Dr. Silverio Lay.   His admission hemoglobin was found to be 17.9, and an FOBT was done as found to be HEME Negative.    The Translation phone Service was used to interview this patient in [place].      Review of Systems:  Constitutional: No Weight Loss, No Weight Gain, Night Sweats, Fevers, Chills, Dizziness, Fatigue, or Generalized Weakness HEENT: No Headaches, Difficulty Swallowing,Tooth/Dental Problems,Sore Throat,  No Sneezing, Rhinitis, Ear Ache, Nasal Congestion, or Post Nasal Drip,  Cardio-vascular:  No Chest pain, Orthopnea, PND, Edema in Lower Extremities, Anasarca, Dizziness, Palpitations  Resp: No Dyspnea, No DOE, No Cough, No Hemoptysis, No Wheezing.    GI: No Heartburn, Indigestion, +Abdominal Pain, +Nausea, +Vomiting, Diarrhea, Hematemesis, Hematochezia, Melena, Change in Bowel Habits,  +Loss of Appetite  GU: No Dysuria, Change in Color of Urine, No Urgency or Frequency, No Flank pain.  Musculoskeletal: No Joint Pain or Swelling, No Decreased Range of Motion, No Back Pain.  Neurologic: No Syncope, No Seizures, Muscle Weakness, Paresthesia, Vision Disturbance or Loss, No Diplopia, No Vertigo, No Difficulty Walking,  Skin: No Rash or Lesions. Psych: No Change in Mood or Affect, No Depression or Anxiety, No Memory loss, No  Confusion, or Hallucinations   Past Medical History  Diagnosis Date  . Ulcer   . Constipation       History reviewed. No pertinent past surgical history.     Prior to Admission medications   Medication Sig Start Date End Date Taking? Authorizing Provider  lansoprazole (PREVACID SOLUTAB) 15 MG disintegrating tablet Take 15 mg by mouth daily at 12 noon.   Yes Historical Provider, MD  Multiple Vitamin (MULTIVITAMIN WITH MINERALS) TABS tablet Take 1 tablet by mouth daily.   Yes Historical Provider, MD      Allergies  Allergen Reactions  . Asa [Aspirin] Other (See Comments)    Current and Hx of GI ulcer - per Pt and friend/translator  . Nsaids Other (See Comments)    Current and Hx of GI ulcer per pt and friend/translator  . Peanut-Containing Drug Products     Pain.       Social History:  reports that he has never smoked. He does not have any smokeless tobacco history on file. He reports that he does not drink alcohol or use illicit drugs.     No family history on file.     Physical Exam:  GEN:  Pleasant Well Nourished and Well Developed 58 y.o. African male examined and in no acute distress; cooperative with exam Filed Vitals:   08/30/14 1635 08/30/14 1925 08/30/14 2238  BP: 136/86 140/76 147/77  Pulse: 91 80 84  Temp: 98.8 F (37.1 C) 98.9 F (37.2 C) 98.7 F (37.1 C)  TempSrc: Oral Oral Oral  Resp: 18  18 16  SpO2: 100% 100% 98%   Blood pressure 147/77, pulse 84, temperature 98.7 F (37.1 C), temperature source Oral, resp. rate 16, SpO2 98.00%. PSYCH: He is alert and oriented x4; does not appear anxious does not appear depressed; affect is normal HEENT: Normocephalic and Atraumatic, Mucous membranes pink; PERRLA; EOM intact; Fundi:  Benign;  No scleral icterus, Nares: Patent, Oropharynx: Clear, Fair Dentition,    Neck:  FROM, No Cervical Lymphadenopathy nor Thyromegaly or Carotid Bruit; No JVD; Breasts:: Not examined CHEST WALL: No tenderness CHEST: Normal  respiration, clear to auscultation bilaterally HEART: Regular rate and rhythm; no murmurs rubs or gallops BACK: No kyphosis or scoliosis; No CVA tenderness ABDOMEN: Decreased Bowel Sounds, Distended,  Soft Non-Tender; No Masses, No Organomegaly Rectal Exam: Not done EXTREMITIES: No Cyanosis, Clubbing, or Edema; No Ulcerations. Genitalia: not examined PULSES: 2+ and symmetric SKIN: Normal hydration no rash or ulceration CNS:  Alert and Oriented x 4, No Focal Deficits Vascular: pulses palpable throughout    Labs on Admission:  Basic Metabolic Panel:  Recent Labs Lab 08/30/14 1838  NA 139  K 4.3  CL 99  CO2 23  GLUCOSE 120*  BUN 22  CREATININE 0.94  CALCIUM 10.3   Liver Function Tests:  Recent Labs Lab 08/30/14 1838  AST 42*  ALT 51  ALKPHOS 88  BILITOT 0.5  PROT 9.3*  ALBUMIN 4.5   No results found for this basename: LIPASE, AMYLASE,  in the last 168 hours No results found for this basename: AMMONIA,  in the last 168 hours CBC:  Recent Labs Lab 08/30/14 1838  WBC 13.7*  NEUTROABS 11.1*  HGB 17.9*  HCT 51.6  MCV 90.5  PLT 228   Cardiac Enzymes: No results found for this basename: CKTOTAL, CKMB, CKMBINDEX, TROPONINI,  in the last 168 hours  BNP (last 3 results) No results found for this basename: PROBNP,  in the last 8760 hours CBG: No results found for this basename: GLUCAP,  in the last 168 hours  Radiological Exams on Admission: Ct Abdomen Pelvis W Contrast  08/30/2014   CLINICAL DATA:  Mid abdominal pain, rectal bleeding.  EXAM: CT ABDOMEN AND PELVIS WITH CONTRAST  TECHNIQUE: Multidetector CT imaging of the abdomen and pelvis was performed using the standard protocol following bolus administration of intravenous contrast.  CONTRAST:  50mL OMNIPAQUE IOHEXOL 300 MG/ML SOLN, OMNIPAQUE IOHEXOL 300 MG/ML SOLN  COMPARISON:  03/13/2012  FINDINGS: Lung bases clear.  Normal heart size.  No appreciable abnormality of the liver, spleen, pancreas, biliary  system, adrenal glands, kidneys. No hydroureteronephrosis.  Colon is decompressed as are distal small bowel loops. Dilated proximal small bowel loops up to 3.5 cm with air-fluid levels to the level of the fecalized segment in the left lower quadrant and abrupt transition at a kinked segment (coronal image 66). Small mild interloop fluid and perihepatic fluid. No free intraperitoneal air. Mild mesenteric edema. No lymphadenopathy.  Normal caliber aorta and branch vessels.  Thin walled bladder.  Normal size prostate gland.  Mild multilevel degenerative change without acute osseous finding.  IMPRESSION: Small bowel obstruction pattern with left lower abdominal transition. Recommend surgical consultation.  Small amount of free intraperitoneal fluid is nonspecific and may be reactive.  Critical Value/emergent results were called by telephone at the time of interpretation on 08/30/2014 at 10:27 pm to Dr. Chaney Malling , who verbally acknowledged these results.   Electronically Signed   By: Jearld Lesch M.D.   On: 08/30/2014 22:28  Assessment/Plan:   58 y.o. male with   Principal Problem:   1.   SBO (small bowel obstruction)   NPO, Bowel Rest, May Need NGTube   IVFs   IV Protonix   General Surgery Consulted by EDP  Active Problems:   2.   Abdominal pain, generalized- due to #1   Pain Control PRN with IV Dilaudid     3.   Nausea and vomiting- due to #1   Anti-emetics PRN    4.   Peptic ulcer disease   Hx   Monitor H/Hs   Admit HB = 17   5.    DVT Prophylaxis    SCDs   Code Status:    FULL CODE   Family Communication:    No Family at Bedside, Unable to Reach Daughter Insurance underwriter) the listed contact by telephone Disposition Plan:       Inpatient  Time spent:  24 Minutes  Ron Parker [facility] Pager [phone]   If 7AM -7PM Please Contact the Day Rounding Team MD for [facility]  If 7PM-7AM, Please Contact night-coverage  [email or web address] Password [facility] 08/30/2014,  11:39 PM

## 2014-08-30 NOTE — ED Notes (Addendum)
Pt presents with c/o abdominal pain and vomiting that started this morning. Pt has a hx of stomach ulcers and reports that the pain has gotten worse today. Pt denies any diarrhea but reports that he has had some rectal bleeding.

## 2014-08-30 NOTE — Progress Notes (Signed)
  CARE MANAGEMENT ED NOTE 08/30/2014  Patient:  Caleb Rivers, Caleb Rivers   Account Number:  [id number]  Date Initiated:  08/30/2014  Documentation initiated by:  Radford Pax  Subjective/Objective Assessment:   Patient presents to Ed with abdominal pain and vomiting that started this morning     Subjective/Objective Assessment Detail:     Action/Plan:   Action/Plan Detail:   Anticipated DC Date:       Status Recommendation to Physician:   Result of Recommendation:    Other ED Services  Consult Working Plan    DC Planning Services  Other  PCP issues    Choice offered to / List presented to:            Status of service:  Completed, signed off  ED Comments:   ED Comments Detail:  EDCM spoke to patient and hhis niece at bedside.  Patient's niece speaks [place].  As per patient's niece, patient does not have insurance or a pcp.  EDCM provided patient's niece with pamphlet to [facility].  EDCM explained to patient's niece that walk-ins are welcome from 9am -1030am at the [facility]. Patient can establish care, receive assistance with his medications, speak to a financial counselor or social worker and enroll for the orange card at the [facility].  EDCM also provided patient with list of pcps who accept self pay patients, list of discounted pharmacies and websites needymeds.org and Good https://figueroa.info/ for [employer] assistance, financial resources in the community sucha as local churches [employer], urban ministries, and dental assistance for uninsured patients.  Patient's niece thankful for resources.  No further EDCM needs at this time.

## 2014-08-31 ENCOUNTER — Inpatient Hospital Stay (HOSPITAL_COMMUNITY): Payer: Self-pay

## 2014-08-31 LAB — BASIC METABOLIC PANEL
ANION GAP: 14 (ref 5–15)
BUN: 21 mg/dL (ref 6–23)
CALCIUM: 9.3 mg/dL (ref 8.4–10.5)
CO2: 23 mEq/L (ref 19–32)
Chloride: 103 mEq/L (ref 96–112)
Creatinine, Ser: 1 mg/dL (ref 0.50–1.35)
GFR, EST NON AFRICAN AMERICAN: 82 mL/min — AB (ref >90)
GLUCOSE: 127 mg/dL — AB (ref 70–99)
POTASSIUM: 3.9 meq/L (ref 3.7–5.3)
Sodium: 140 mEq/L (ref 137–147)

## 2014-08-31 LAB — CBC
HCT: 46.2 % (ref 39.0–52.0)
Hemoglobin: 15.9 g/dL (ref 13.0–17.0)
MCH: 31.2 pg (ref 26.0–34.0)
MCHC: 34.4 g/dL (ref 30.0–36.0)
MCV: 90.8 fL (ref 78.0–100.0)
PLATELETS: 221 10*3/uL (ref 150–400)
RBC: 5.09 MIL/uL (ref 4.22–5.81)
RDW: 15 % (ref 11.5–15.5)
WBC: 11.3 10*3/uL — AB (ref 4.0–10.5)

## 2014-08-31 LAB — OCCULT BLOOD X 1 CARD TO LAB, STOOL: FECAL OCCULT BLD: NEGATIVE

## 2014-08-31 MED ORDER — LORAZEPAM 2 MG/ML IJ SOLN: 0.5000 mg | Freq: Once | INTRAMUSCULAR | Status: DC

## 2014-08-31 MED ORDER — ACETAMINOPHEN 650 MG RE SUPP: 650.0000 mg | Freq: Four times a day (QID) | RECTAL | Status: DC | PRN

## 2014-08-31 MED ORDER — SODIUM CHLORIDE 0.9 % IV SOLN
INTRAVENOUS | Status: DC
Start: 2014-08-31 — End: 2014-09-04
  Administered 2014-08-31 – 2014-09-04 (×7): via INTRAVENOUS

## 2014-08-31 MED ORDER — SODIUM CHLORIDE 0.9 % IJ SOLN
INTRAMUSCULAR | Status: AC
  Filled 2014-08-31: qty 10

## 2014-08-31 MED ORDER — OXYCODONE HCL 5 MG PO TABS: 5.0000 mg | ORAL_TABLET | ORAL | Status: DC | PRN

## 2014-08-31 MED ORDER — SODIUM CHLORIDE 0.9 % IV SOLN
INTRAVENOUS | Status: AC
  Administered 2014-08-31: 01:00:00 via INTRAVENOUS

## 2014-08-31 MED ORDER — ACETAMINOPHEN 325 MG PO TABS: 650.0000 mg | ORAL_TABLET | Freq: Four times a day (QID) | ORAL | Status: DC | PRN

## 2014-08-31 MED ORDER — ONDANSETRON HCL 4 MG PO TABS: 4.0000 mg | ORAL_TABLET | Freq: Four times a day (QID) | ORAL | Status: DC | PRN

## 2014-08-31 MED ORDER — HYDROMORPHONE HCL PF 1 MG/ML IJ SOLN
0.5000 mg | INTRAMUSCULAR | Status: DC | PRN
  Administered 2014-08-31 (×5): 1 mg via INTRAVENOUS
  Administered 2014-09-01: 0.5 mg via INTRAVENOUS
  Administered 2014-09-02 – 2014-09-03 (×3): 1 mg via INTRAVENOUS
  Filled 2014-08-31 (×9): qty 1

## 2014-08-31 MED ORDER — ONDANSETRON HCL 4 MG/2ML IJ SOLN
4.0000 mg | Freq: Four times a day (QID) | INTRAMUSCULAR | Status: DC | PRN
  Administered 2014-08-31 (×2): 4 mg via INTRAVENOUS
  Filled 2014-08-31 (×2): qty 2

## 2014-08-31 NOTE — Care Management Note (Unsigned)
    Page 1 of 1   08/31/2014     8:25:50 AM CARE MANAGEMENT NOTE 08/31/2014  Patient:  Caleb Rivers, Caleb Rivers   Account Number:  [id number]  Date Initiated:  08/31/2014  Documentation initiated by:  [facility]  Subjective/Objective Assessment:   adm: abd pain     Action/Plan:   Anticipated DC Date:     Anticipated DC Plan:           Choice offered to / List presented to:             Status of service:   Medicare Important Message given?   (If response is "NO", the following Medicare IM given date fields will be blank) Date Medicare IM given:   Medicare IM given by:   Date Additional Medicare IM given:   Additional Medicare IM given by:    Discharge Disposition:    Per UR Regulation:  Reviewed for med. necessity/level of care/duration of stay  If discussed at [place] of Stay Meetings, dates discussed:    Comments:  08/31/14 08:00 CM met with pt in room.  Pt speaks [place]; this CM's french is limited.  Pt has friend, Caleb Rivers [phone] and friend Caleb Rivers [phone] who speak English.  Will monitor for disposition.  Caleb Rivers, BSN, CM [phone].

## 2014-08-31 NOTE — Consult Note (Signed)
General Surgery [facility]  Patient seen and examined.  Niece at bedside.  NG tube placed.  AXR shows NG in stomach.  Dilated loops of small bowel, some gas in colon.  Abdomen moderately distended, tense, mild tenderness.  Will follow.  Encouraged ambulation as tolerated.  Velora Heckler, MD, [facility] Office: [phone]

## 2014-08-31 NOTE — Progress Notes (Addendum)
Patient ID: Caleb Rivers, male   DOB: 1956-02-22, 58 y.o.   MRN: [id number] [facility] PROGRESS NOTE  Myrtle Barnhard WUJ:[id number] DOB: 10-07-1956 DOA: 08/30/2014 PCP: No primary provider on file.  Brief narrative: 58 y.o. Male with past medical history of peptic ulcer disease who presented to [facility] ED 08/30/2014 with ongoing nausea and vomiting for past few days piror to this admission. Pt also reported having dark stools. He also reported associated lower abdominal pain and increased abdominal girth. On admission, patient was found to have small bowel obstruction with transition point in left lower abdomen. Surgery is seeing the pt in consultation and we appreciate their recommendations.   Assessment/Plan:    Principal Problem:   SBO (small bowel obstruction)  Unclear etiology  Appreciate surgery following  Continue conservative management with IV fluids, NPO, gastric tube and analgesia and antiemetics as needed  DVT Prophylaxis   SCD's bilaterally   Code Status: Full.  Family Communication:  plan of care discussed with the patient Disposition Plan: Home when stable.    IV Access:   Peripheral IV Procedures and diagnostic studies:    Ct Abdomen Pelvis W Contrast 08/30/2014  Small bowel obstruction pattern with left lower abdominal transition. Recommend surgical consultation.  Small amount of free intraperitoneal fluid is nonspecific and may be reactive.  Critical Value/emergent results were called by telephone at the time of interpretation on 08/30/2014 at 10:27 pm to Dr. Chaney Malling , who verbally acknowledged these results.   Electronically Signed   By: Jearld Lesch M.D.   On: 08/30/2014 22:28   Dg Abd 2 Views 08/31/2014   1. Well-positioned gastric tube. 2. Persistent air-filled and dilated small bowel in the mid abdomen with a maximal diameter of 4.9 cm consistent with at least partial small bowel obstruction.   Electronically Signed   By: Malachy Moan M.D.   On:  08/31/2014 11:39   Medical Consultants:   Surgery  Other Consultants:   None  Anti-Infectives:   None    Manson Passey, MD  [facility] Pager [phone]  If 7PM-7AM, please contact night-coverage [email or web address] Password [email or web address] 08/31/2014, 2:35 PM   LOS: 1 day    HPI/Subjective: No acute overnight events.  Objective: Filed Vitals:   08/31/14 0102 08/31/14 0556 08/31/14 1032 08/31/14 1320  BP: 139/70 140/74 150/96 152/81  Pulse: 77 72 75 74  Temp: 98.7 F (37.1 C) 98.9 F (37.2 C) 98.5 F (36.9 C) 98.7 F (37.1 C)  TempSrc: Oral Oral Oral   Resp: SpO2: 99% 98% 97% 100%    Intake/Output Summary (Last 24 hours) at 08/31/14 1435 Last data filed at 08/31/14 1100  Gross per 24 hour  Intake      0 ml  Output      0 ml  Net      0 ml    Exam:   General:  Pt is alert, follows commands appropriately, not in acute distress  Cardiovascular: Regular rate and rhythm, S1/S2, no murmurs  Respiratory: Clear to auscultation bilaterally, no wheezing, no crackles, no rhonchi  Abdomen: distended, not tender, bowel sounds present  Extremities: No edema, pulses DP and PT palpable bilaterally  Neuro: Grossly nonfocal  Data Reviewed: Basic Metabolic Panel:  Recent Labs Lab 08/30/14 1838 08/31/14 0458  NA 139 140  K 4.3 3.9  CL 99 103  CO2 23 23  GLUCOSE 120* 127*  BUN 22 21  CREATININE 0.94 1.00  CALCIUM 10.3 9.3   Liver  Function Tests:  Recent Labs Lab 08/30/14 1838  AST 42*  ALT 51  ALKPHOS 88  BILITOT 0.5  PROT 9.3*  ALBUMIN 4.5   No results found for this basename: LIPASE, AMYLASE,  in the last 168 hours No results found for this basename: AMMONIA,  in the last 168 hours CBC:  Recent Labs Lab 08/30/14 1838 08/31/14 0458  WBC 13.7* 11.3*  NEUTROABS 11.1*  --   HGB 17.9* 15.9  HCT 51.6 46.2  MCV 90.5 90.8  PLT 228 221   Cardiac Enzymes: No results found for this basename: CKTOTAL, CKMB, CKMBINDEX, TROPONINI,  in the last  168 hours BNP: No components found with this basename: POCBNP,  CBG: No results found for this basename: GLUCAP,  in the last 168 hours  No results found for this or any previous visit (from the past 240 hour(s)).   Scheduled Meds: . LORazepam  0.5 mg Intravenous Once  . pantoprazole (PROTONIX) IV  40 mg Intravenous Q12H   Continuous Infusions: . sodium chloride Stopped (08/31/14 0101)

## 2014-08-31 NOTE — Progress Notes (Signed)
#  16 Fr nasogastric tube placed & secured to intermitant low wall suction. X-ray called to confirm.

## 2014-08-31 NOTE — Consult Note (Signed)
Reason for Consult:  SBO/rectal blood Referring Physician:   [name] is an 58 y.o. male.  HPI: This is a healthy gentleman who has a hx of ulcers 2010.  He was also seen last year for chest pain with normal Cardiac cath.  He did have a positive occult stool that admit.  He has apparently been fine since then.  He does report dark black stools he thinks has blood in them.  2 days ago he started having some nausea and vomiting.  He says this and he point to the lower abdomen as being very painful.  He says pain comes and goes last 20 -30 minutes. It started early yesterday Am after eating some rice and peanuts.  He has had multiple episodes of nausea, vomiting and ongoing abdominal pain.  His last BM was yesterday and he says it was normal.  He said the last dark stool was 3 days ago.  He has no history of prior obstruction, and no prior surgeries.  He is afebrile, WBC is up some, his H/H is up some and he had a left shift.  CT scan shows SBO with a transition site in the Left lower abdomen.  We are ask to see.   He was admitted last PM, he is NPO, is being hydrated and has pain medication ordered.  Past Medical History  Diagnosis Date  Ulcer  EGD 2010 Dr. Fuller Plan   Chest pain with normal cardiac cath at that time./nausea and vomiting/trace + occult blood at exam then.   Abnormal EKG   Constipation     History reviewed. No pertinent past surgical history. Pt reports no prior surgeries No hx of colonoscopy    No family history on file.  Social History:  reports that he has never smoked. He does not have any smokeless tobacco history on file. He reports that he does not drink alcohol or use illicit drugs.  Allergies:  Allergies  Allergen Reactions  . Asa [Aspirin] Other (See Comments)    Current and Hx of GI ulcer - per Pt and friend/translator  . Nsaids Other (See Comments)    Current and Hx of GI ulcer per pt and friend/translator  . Peanut-Containing Drug  Products     Pain.      Medications:  Prior to Admission:  Prescriptions prior to admission  Medication Sig Dispense Refill  . lansoprazole (PREVACID SOLUTAB) 15 MG disintegrating tablet Take 15 mg by mouth daily at 12 noon.      . Multiple Vitamin (MULTIVITAMIN WITH MINERALS) TABS tablet Take 1 tablet by mouth daily.       Scheduled: . sodium chloride   Intravenous STAT  . LORazepam  0.5 mg Intravenous Once  . pantoprazole (PROTONIX) IV  40 mg Intravenous Q12H   Continuous: . sodium chloride Stopped (08/31/14 0101)   FMB:[id number], acetaminophen, HYDROmorphone (DILAUDID) injection, ondansetron (ZOFRAN) IV Anti-infectives   None      Results for orders placed during the hospital encounter of 08/30/14 (from the past 48 hour(s))  BASIC METABOLIC PANEL     Status: Abnormal   Collection Time    08/30/14  6:38 PM      Result Value Ref Range   Sodium 139  137 - 147 mEq/L   Potassium 4.3  3.7 - 5.3 mEq/L   Chloride 99  96 - 112 mEq/L   CO2 23  19 - 32 mEq/L   Glucose, Bld 120 (*) 70 - 99 mg/dL  BUN 22  6 - 23 mg/dL   Creatinine, Ser 0.94  0.50 - 1.35 mg/dL   Calcium 10.3  8.4 - 10.5 mg/dL   GFR calc non Af Amer >90  >90 mL/min   GFR calc Af Amer >90  >90 mL/min   Comment: (NOTE)     The eGFR has been calculated using the CKD EPI equation.     This calculation has not been validated in all clinical situations.     eGFR's persistently <90 mL/min signify possible Chronic Kidney     Disease.   Anion gap 17 (*) 5 - 15  CBC WITH DIFFERENTIAL     Status: Abnormal   Collection Time    08/30/14  6:38 PM      Result Value Ref Range   WBC 13.7 (*) 4.0 - 10.5 K/uL   RBC 5.70  4.22 - 5.81 MIL/uL   Hemoglobin 17.9 (*) 13.0 - 17.0 g/dL   HCT 51.6  39.0 - 52.0 %   MCV 90.5  78.0 - 100.0 fL   MCH 31.4  26.0 - 34.0 pg   MCHC 34.7  30.0 - 36.0 g/dL   RDW 14.9  11.5 - 15.5 %   Platelets 228  150 - 400 K/uL   Neutrophils Relative % 81 (*) 43 - 77 %   Neutro Abs 11.1 (*) 1.7 - 7.7  K/uL   Lymphocytes Relative 14  12 - 46 %   Lymphs Abs 1.9  0.7 - 4.0 K/uL   Monocytes Relative 5  3 - 12 %   Monocytes Absolute 0.6  0.1 - 1.0 K/uL   Eosinophils Relative 0  0 - 5 %   Eosinophils Absolute 0.0  0.0 - 0.7 K/uL   Basophils Relative 0  0 - 1 %   Basophils Absolute 0.0  0.0 - 0.1 K/uL  HEPATIC FUNCTION PANEL     Status: Abnormal   Collection Time    08/30/14  6:38 PM      Result Value Ref Range   Total Protein 9.3 (*) 6.0 - 8.3 g/dL   Albumin 4.5  3.5 - 5.2 g/dL   AST 42 (*) 0 - 37 U/L   ALT 51  0 - 53 U/L   Alkaline Phosphatase 88  39 - 117 U/L   Total Bilirubin 0.5  0.3 - 1.2 mg/dL   Bilirubin, Direct <0.2  0.0 - 0.3 mg/dL   Indirect Bilirubin NOT CALCULATED  0.3 - 0.9 mg/dL  URINALYSIS, ROUTINE W REFLEX MICROSCOPIC     Status: Abnormal   Collection Time    08/30/14  7:22 PM      Result Value Ref Range   Color, Urine YELLOW  YELLOW   APPearance CLEAR  CLEAR   Specific Gravity, Urine 1.028  1.005 - 1.030   pH 5.5  5.0 - 8.0   Glucose, UA NEGATIVE  NEGATIVE mg/dL   Hgb urine dipstick TRACE (*) NEGATIVE   Bilirubin Urine NEGATIVE  NEGATIVE   Ketones, ur NEGATIVE  NEGATIVE mg/dL   Protein, ur 30 (*) NEGATIVE mg/dL   Urobilinogen, UA 0.2  0.0 - 1.0 mg/dL   Nitrite NEGATIVE  NEGATIVE   Leukocytes, UA NEGATIVE  NEGATIVE  URINE MICROSCOPIC-ADD ON     Status: None   Collection Time    08/30/14  7:22 PM      Result Value Ref Range   Urine-Other       Value: NO FORMED ELEMENTS SEEN ON URINE MICROSCOPIC EXAMINATION  POC OCCULT BLOOD, ED     Status: None   Collection Time    08/30/14  8:01 PM      Result Value Ref Range   Fecal Occult Bld NEGATIVE  NEGATIVE  BASIC METABOLIC PANEL     Status: Abnormal   Collection Time    08/31/14  4:58 AM      Result Value Ref Range   Sodium 140  137 - 147 mEq/L   Potassium 3.9  3.7 - 5.3 mEq/L   Chloride 103  96 - 112 mEq/L   CO2 23  19 - 32 mEq/L   Glucose, Bld 127 (*) 70 - 99 mg/dL   BUN 21  6 - 23 mg/dL   Creatinine,  Ser 1.00  0.50 - 1.35 mg/dL   Calcium 9.3  8.4 - 10.5 mg/dL   GFR calc non Af Amer 82 (*) >90 mL/min   GFR calc Af Amer >90  >90 mL/min   Comment: (NOTE)     The eGFR has been calculated using the CKD EPI equation.     This calculation has not been validated in all clinical situations.     eGFR's persistently <90 mL/min signify possible Chronic Kidney     Disease.   Anion gap 14  5 - 15  CBC     Status: Abnormal   Collection Time    08/31/14  4:58 AM      Result Value Ref Range   WBC 11.3 (*) 4.0 - 10.5 K/uL   RBC 5.09  4.22 - 5.81 MIL/uL   Hemoglobin 15.9  13.0 - 17.0 g/dL   HCT 46.2  39.0 - 52.0 %   MCV 90.8  78.0 - 100.0 fL   MCH 31.2  26.0 - 34.0 pg   MCHC 34.4  30.0 - 36.0 g/dL   RDW 15.0  11.5 - 15.5 %   Platelets 221  150 - 400 K/uL    Ct Abdomen Pelvis W Contrast  08/30/2014   CLINICAL DATA:  Mid abdominal pain, rectal bleeding.  EXAM: CT ABDOMEN AND PELVIS WITH CONTRAST  TECHNIQUE: Multidetector CT imaging of the abdomen and pelvis was performed using the standard protocol following bolus administration of intravenous contrast.  CONTRAST:  47mL OMNIPAQUE IOHEXOL 300 MG/ML SOLN, 189mL OMNIPAQUE IOHEXOL 300 MG/ML SOLN  COMPARISON:  03/13/2012  FINDINGS: Lung bases clear.  Normal heart size.  No appreciable abnormality of the liver, spleen, pancreas, biliary system, adrenal glands, kidneys. No hydroureteronephrosis.  Colon is decompressed as are distal small bowel loops. Dilated proximal small bowel loops up to 3.5 cm with air-fluid levels to the level of the fecalized segment in the left lower quadrant and abrupt transition at a kinked segment (coronal image 66). Small mild interloop fluid and perihepatic fluid. No free intraperitoneal air. Mild mesenteric edema. No lymphadenopathy.  Normal caliber aorta and branch vessels.  Thin walled bladder.  Normal size prostate gland.  Mild multilevel degenerative change without acute osseous finding.  IMPRESSION: Small bowel obstruction pattern  with left lower abdominal transition. Recommend surgical consultation.  Small amount of free intraperitoneal fluid is nonspecific and may be reactive.  Critical Value/emergent results were called by telephone at the time of interpretation on 08/30/2014 at 10:27 pm to Dr. Shirlyn Goltz , who verbally acknowledged these results.   Electronically Signed   By: Carlos Levering M.D.   On: 08/30/2014 22:28    Review of Systems  Constitutional: Negative for fever, chills, weight loss, malaise/fatigue and diaphoresis.  Pt speaks [place], all information is thru phone interpreter and niece.  HENT: Negative.   Eyes: Negative.   Respiratory: Negative.   Cardiovascular: Negative.   Gastrointestinal: Positive for heartburn, nausea, vomiting, abdominal pain and blood in stool (he says it's black and he thinks it has blood in it.  I don't think he has actually seen blood.).  Genitourinary: Negative.   Musculoskeletal: Negative.   Skin: Negative.   Neurological: Negative.  Negative for weakness.  Endo/Heme/Allergies: Negative.   Psychiatric/Behavioral: Negative.    Blood pressure 140/74, pulse 72, temperature 98.9 F (37.2 C), temperature source Oral, resp. rate 16, SpO2 98.00%. Physical Exam  Constitutional: He is oriented to person, place, and time. He appears well-developed and well-nourished. He appears distressed (complains of pain).  HENT:  Head: Normocephalic and atraumatic.  Nose: Nose normal.  Eyes: Conjunctivae are normal. Pupils are equal, round, and reactive to light. Right eye exhibits no discharge. Left eye exhibits no discharge. No scleral icterus.  Neck: Normal range of motion. Neck supple. No JVD present. No tracheal deviation present. No thyromegaly present.  Cardiovascular: Normal rate, regular rhythm, normal heart sounds and intact distal pulses.   No murmur heard. Respiratory: Effort normal and breath sounds normal. No respiratory distress. He has no wheezes. He has no rales. He  exhibits no tenderness.  GI: He exhibits distension. He exhibits no mass. There is tenderness (some, but no peritionitis). There is no rebound and no guarding.  Genitourinary: Guaiac positive stool (pending, I did not find any stool in the vault. I sent it anyway.).  Musculoskeletal: He exhibits no edema.  Lymphadenopathy:    He has no cervical adenopathy.  Neurological: He is alert and oriented to person, place, and time. No cranial nerve deficit.  Skin: Skin is warm and dry. No rash noted. No erythema. No pallor.  Psychiatric: He has a normal mood and affect. His behavior is normal. Judgment and thought content normal.    Assessment/Plan: 1.  Abdominal pain, nausea and vomiting with SBO by CT scan 2.  Possible occult blood in stool, no hx of primary care or colonsocopy 3.  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 Lucio Edward. 4.  Hx of abnormal EKG with cardiac cath 09/25/13 Dr. Claiborne Billings:  Normal LV function   Normal coronary arteries  5.  Hx of constipation  Plan:  He continues to have abdominal pain, and nausea.  He is not vomiting since he got to the hospital.  I will get an NG in him and we will place him on LIWS to decompress him.  Recheck film after NG is in.  Follow with you.  I did a rectal exam, no stool in vault that I could reach.  I have ordered stool occult when he does have BM.    Thomasina Housley 08/31/2014, 9:37 AM

## 2014-09-01 ENCOUNTER — Inpatient Hospital Stay (HOSPITAL_COMMUNITY): Payer: BC Managed Care – PPO

## 2014-09-01 NOTE — Progress Notes (Signed)
Patient ID: Caleb Rivers, male   DOB: 03-16-1956, 58 y.o.   MRN: [id number] [facility] PROGRESS NOTE  Caleb Rivers WGN:[id number] DOB: Apr 18, 1956 DOA: 08/30/2014 PCP: No primary provider on file.  Brief narrative: 58 y.o. Male with past medical history of peptic ulcer disease who presented to [facility] ED 08/30/2014 with ongoing nausea and vomiting for past few days piror to this admission. Pt also reported having dark stools. He also reported associated lower abdominal pain and increased abdominal girth. On admission, patient was found to have small bowel obstruction with transition point in left lower abdomen. Surgery is seeing the pt in consultation.  Assessment/Plan:   Principal Problem:  SBO (small bowel obstruction)  Unclear etiology. Repeating abdominal x-ray this morning shows persistent gaseous distended loops of small bowel compatible with partial small bowel obstruction. Continue NG tube, IV fluids, analgesia and antiemetics as needed. Appreciate surgery following   DVT Prophylaxis  SCD' bilaterally  Code Status: Full.  Family Communication: plan of care discussed with the patient  Disposition Plan: Home when stable.    IV Access:   Peripheral IV Procedures and diagnostic studies:   Ct Abdomen Pelvis W Contrast 08/30/2014 Small bowel obstruction pattern with left lower abdominal transition. Recommend surgical consultation. Small amount of free intraperitoneal fluid is nonspecific and may be reactive.  Dg Abd 2 Views 08/31/2014 1. Well-positioned gastric tube. 2. Persistent air-filled and dilated small bowel in the mid abdomen with a maximal diameter of 4.9 cm consistent with at least partial small bowel obstruction. Dg Abd 2 Views 09/01/2014   Interval retraction of enteric tube, tip is in the distal esophagus. Recommend advancement.  Persistent gaseous distended loops of small bowel within the central abdomen most compatible with at least partial small bowel obstruction  Medical  Consultants:   Surgery  Other Consultants:   None  Anti-Infectives:   None   Manson Passey, MD  [facility] Pager [phone]  If 7PM-7AM, please contact night-coverage [email or web address] Password [email or web address] 09/01/2014, 11:46 AM   LOS: 2 days    HPI/Subjective: No acute overnight events.  Objective: Filed Vitals:   08/31/14 1800 08/31/14 2108 08/31/14 2112 09/01/14 0422  BP: 136/79  162/78 152/90  Pulse: 77  77 80  Temp: 98.2 F (36.8 C)  97.9 F (36.6 C) 97.6 F (36.4 C)  TempSrc:   Oral Oral  Resp: Height:   (1.956 m)    Weight:  86.183 kg (190 lb)    SpO2: 98%  97% 99%    Intake/Output Summary (Last 24 hours) at 09/01/14 1146 Last data filed at 09/01/14 0900  Gross per 24 hour  Intake   1500 ml  Output   1125 ml  Net    375 ml    Exam:   General:  Pt is alert, not in acute distress  Cardiovascular: Regular rate and rhythm, S1/S2, no murmurs  Respiratory: bilateral air entry, no wheezing  Abdomen: distended but improved   Extremities: No edema, pulses DP and PT palpable bilaterally  Neuro: Grossly nonfocal  Data Reviewed: Basic Metabolic Panel:  Recent Labs Lab 08/30/14 1838 08/31/14 0458  NA 139 140  K 4.3 3.9  CL 99 103  CO2 23 23  GLUCOSE 120* 127*  BUN 22 21  CREATININE 0.94 1.00  CALCIUM 10.3 9.3   Liver Function Tests:  Recent Labs Lab 08/30/14 1838  AST 42*  ALT 51  ALKPHOS 88  BILITOT 0.5  PROT 9.3*  ALBUMIN 4.5  No results found for this basename: LIPASE, AMYLASE,  in the last 168 hours No results found for this basename: AMMONIA,  in the last 168 hours CBC:  Recent Labs Lab 08/30/14 1838 08/31/14 0458  WBC 13.7* 11.3*  NEUTROABS 11.1*  --   HGB 17.9* 15.9  HCT 51.6 46.2  MCV 90.5 90.8  PLT 228 221   Cardiac Enzymes: No results found for this basename: CKTOTAL, CKMB, CKMBINDEX, TROPONINI,  in the last 168 hours BNP: No components found with this basename: POCBNP,  CBG: No results found for  this basename: GLUCAP,  in the last 168 hours  No results found for this or any previous visit (from the past 240 hour(s)).   Scheduled Meds: . LORazepam  0.5 mg Intravenous Once  . pantoprazole (PROTONIX) IV  40 mg Intravenous Q12H   Continuous Infusions: . sodium chloride 100 mL/hr at 09/01/14 0415

## 2014-09-01 NOTE — Progress Notes (Signed)
0815  NGT advanced. Radiology xray showed tube in distal esophagus.Sharrell Ku RN

## 2014-09-01 NOTE — Progress Notes (Signed)
Patient ID: Caleb Rivers, male   DOB: July 28, 1956, 58 y.o.   MRN: [id number]  General Surgery - [facility] - Progress Note  Subjective: Patient feels better.  Family at bedside to translate.  Just back from radiology.  States passing some flatus, no BM.  Denies pain.  Objective: Vital signs in last 24 hours: Temp:  [97.6 F (36.4 C)-98.7 F (37.1 C)] 97.6 F (36.4 C) (09/04 0422) Pulse Rate:  [74-80] 80 (09/04 0422) Resp:  [16-18] 18 (09/04 0422) BP: (136-162)/(78-96) 152/90 mmHg (09/04 0422) SpO2:  [97 %-100 %] 99 % (09/04 0422) Weight:  [190 lb (86.183 kg)] 190 lb (86.183 kg) (09/03 2108) Last BM Date: 08/30/14  Intake/Output from previous day: 09/03 0701 - 09/04 0700 In: 1500 [I.V.:1200; NG/GT:300] Out: 825 [Urine:825]  Exam: HEENT - clear, not icteric Neck - soft Chest - clear bilaterally Cor - RRR, no murmur Abd - mild distension; BS present; mild diffuse tenderness; no guarding; no mass Ext - no significant edema Neuro - grossly intact, no focal deficits  Lab Results:   Recent Labs  08/30/14 1838 08/31/14 0458  WBC 13.7* 11.3*  HGB 17.9* 15.9  HCT 51.6 46.2  PLT 228 221     Recent Labs  08/30/14 1838 08/31/14 0458  NA 139 140  K 4.3 3.9  CL 99 103  CO2 23 23  GLUCOSE 120* 127*  BUN 22 21  CREATININE 0.94 1.00  CALCIUM 10.3 9.3    Studies/Results: Ct Abdomen Pelvis W Contrast  08/30/2014   CLINICAL DATA:  Mid abdominal pain, rectal bleeding.  EXAM: CT ABDOMEN AND PELVIS WITH CONTRAST  TECHNIQUE: Multidetector CT imaging of the abdomen and pelvis was performed using the standard protocol following bolus administration of intravenous contrast.  CONTRAST:  50mL OMNIPAQUE IOHEXOL 300 MG/ML SOLN, OMNIPAQUE IOHEXOL 300 MG/ML SOLN  COMPARISON:  03/13/2012  FINDINGS: Lung bases clear.  Normal heart size.  No appreciable abnormality of the liver, spleen, pancreas, biliary system, adrenal glands, kidneys. No hydroureteronephrosis.   Colon is decompressed as are distal small bowel loops. Dilated proximal small bowel loops up to 3.5 cm with air-fluid levels to the level of the fecalized segment in the left lower quadrant and abrupt transition at a kinked segment (coronal image 66). Small mild interloop fluid and perihepatic fluid. No free intraperitoneal air. Mild mesenteric edema. No lymphadenopathy.  Normal caliber aorta and branch vessels.  Thin walled bladder.  Normal size prostate gland.  Mild multilevel degenerative change without acute osseous finding.  IMPRESSION: Small bowel obstruction pattern with left lower abdominal transition. Recommend surgical consultation.  Small amount of free intraperitoneal fluid is nonspecific and may be reactive.  Critical Value/emergent results were called by telephone at the time of interpretation on 08/30/2014 at 10:27 pm to Dr. Chaney Malling , who verbally acknowledged these results.   Electronically Signed   By: Jearld Lesch M.D.   On: 08/30/2014 22:28   Dg Abd 2 Views  09/01/2014   CLINICAL DATA:  Follow-up small bowel obstruction.  EXAM: ABDOMEN - 2 VIEW  COMPARISON:  Radiograph 08/31/2014  FINDINGS: Oral contrast material is demonstrated throughout the transverse and descending colon. There are persistent gaseous distended loops of small bowel within the central abdomen (4.5 cm). No definite pneumatosis or portal venous gas. No definite free intraperitoneal air. Interval retraction of the enteric tube with the tip at the distal esophagus. Regional skeleton is unremarkable. Pelvic phleboliths.  IMPRESSION: Interval retraction of enteric tube, tip is in  the distal esophagus. Recommend advancement.  Persistent gaseous distended loops of small bowel within the central abdomen most compatible with at least partial small bowel obstruction  These results will be called to the ordering clinician or representative by the Radiologist Assistant, and communication documented in the PACS or zVision Dashboard.    Electronically Signed   By: Annia Belt M.D.   On: 09/01/2014 07:50   Dg Abd 2 Views  08/31/2014   CLINICAL DATA:  Small bowel obstruction, nasogastric tube placement  EXAM: ABDOMEN - 2 VIEW  COMPARISON:  CT abdomen/ pelvis 08/30/2014  FINDINGS: Well-positioned gastric tube. The tip of the tube overlies the gastric body.  No evidence of free air on the upright view. Numerous loops of air-filled and dilated small bowel are present throughout the abdomen. Maximal small bowel diameter is 4.9 cm. Excreted contrast material is present within the the urinary bladder. Of note, no oral contrast material is present within the bowel. No acute osseous abnormality. Negative for ascites.  IMPRESSION: 1. Well-positioned gastric tube. 2. Persistent air-filled and dilated small bowel in the mid abdomen with a maximal diameter of 4.9 cm consistent with at least partial small bowel obstruction.   Electronically Signed   By: Malachy Moan M.D.   On: 08/31/2014 11:39    Assessment / Plan: 1.  Small bowel obstruction, resolving  AXR this AM improved - contrast throughout colon, small bowels loops less distended  ? Etiology of obstruction - may need colonoscopy in few weeks if resolves  Continue NG tube today, NPO, IV hydration  Encouraged ambulation, OOB  Velora Heckler, MD, [facility] Office: [phone]  09/01/2014

## 2014-09-02 DIAGNOSIS — K2970 Gastritis, unspecified, without bleeding: Secondary | ICD-10-CM

## 2014-09-02 DIAGNOSIS — K2990 Gastroduodenitis, unspecified, without bleeding: Secondary | ICD-10-CM

## 2014-09-02 NOTE — Progress Notes (Signed)
Subjective: No pain.  Passing gas.  Objective: Vital signs in last 24 hours: Temp:  [98.4 F (36.9 C)-99.4 F (37.4 C)] 99.4 F (37.4 C) (09/05 0510) Pulse Rate:  [69-80] 73 (09/05 0510) Resp:  [16] 16 (09/05 0510) BP: (154-155)/(66-93) 155/93 mmHg (09/05 0510) SpO2:  [98 %-100 %] 100 % (09/05 0510) Last BM Date: 08/30/14  Intake/Output from previous day: September 25, 2023 0701 - 09/05 0700 In: 2835 [I.V.:2425; NG/GT:410] Out: 1975 [Urine:1525; Emesis/NG output:450-clear Intake/Output this shift:    PE: General- In NAD Abdomen-soft, non-tender, non-distended, + bowel sounds  Lab Results:   Recent Labs  08/30/14 1838 08/31/14 0458  WBC 13.7* 11.3*  HGB 17.9* 15.9  HCT 51.6 46.2  PLT 228 221   BMET  Recent Labs  08/30/14 1838 08/31/14 0458  NA 139 140  K 4.3 3.9  CL 99 103  CO2 23 23  GLUCOSE 120* 127*  BUN 22 21  CREATININE 0.94 1.00  CALCIUM 10.3 9.3   PT/INR No results found for this basename: LABPROT, INR,  in the last 72 hours Comprehensive Metabolic Panel:    Component Value Date/Time   NA 140 08/31/2014 0458   NA 139 08/30/2014 1838   K 3.9 08/31/2014 0458   K 4.3 08/30/2014 1838   CL 103 08/31/2014 0458   CL 99 08/30/2014 1838   CO2 23 08/31/2014 0458   CO2 23 08/30/2014 1838   BUN 21 08/31/2014 0458   BUN 22 08/30/2014 1838   CREATININE 1.00 08/31/2014 0458   CREATININE 0.94 08/30/2014 1838   GLUCOSE 127* 08/31/2014 0458   GLUCOSE 120* 08/30/2014 1838   CALCIUM 9.3 08/31/2014 0458   CALCIUM 10.3 08/30/2014 1838   AST 42* 08/30/2014 1838   AST 43* 09/25/2013 0800   ALT 51 08/30/2014 1838   ALT 48 09/25/2013 0800   ALKPHOS 88 08/30/2014 1838   ALKPHOS 77 09/25/2013 0800   BILITOT 0.5 08/30/2014 1838   BILITOT 0.4 09/25/2013 0800   PROT 9.3* 08/30/2014 1838   PROT 8.3 09/25/2013 0800   ALBUMIN 4.5 08/30/2014 1838   ALBUMIN 4.0 09/25/2013 0800     Studies/Results: Dg Abd 2 Views  Sep 24, 2014   CLINICAL DATA:  Follow-up small bowel obstruction.  EXAM: ABDOMEN - 2 VIEW  COMPARISON:   Radiograph 08/31/2014  FINDINGS: Oral contrast material is demonstrated throughout the transverse and descending colon. There are persistent gaseous distended loops of small bowel within the central abdomen (4.5 cm). No definite pneumatosis or portal venous gas. No definite free intraperitoneal air. Interval retraction of the enteric tube with the tip at the distal esophagus. Regional skeleton is unremarkable. Pelvic phleboliths.  IMPRESSION: Interval retraction of enteric tube, tip is in the distal esophagus. Recommend advancement.  Persistent gaseous distended loops of small bowel within the central abdomen most compatible with at least partial small bowel obstruction  These results will be called to the ordering clinician or representative by the Radiologist Assistant, and communication documented in the PACS or zVision Dashboard.   Electronically Signed   By: Annia Belt M.D.   On: 24-Sep-2014 07:50   Dg Abd 2 Views  08/31/2014   CLINICAL DATA:  Small bowel obstruction, nasogastric tube placement  EXAM: ABDOMEN - 2 VIEW  COMPARISON:  CT abdomen/ pelvis 08/30/2014  FINDINGS: Well-positioned gastric tube. The tip of the tube overlies the gastric body.  No evidence of free air on the upright view. Numerous loops of air-filled and dilated small bowel are present throughout the abdomen. Maximal small  bowel diameter is 4.9 cm. Excreted contrast material is present within the the urinary bladder. Of note, no oral contrast material is present within the bowel. No acute osseous abnormality. Negative for ascites.  IMPRESSION: 1. Well-positioned gastric tube. 2. Persistent air-filled and dilated small bowel in the mid abdomen with a maximal diameter of 4.9 cm consistent with at least partial small bowel obstruction.   Electronically Signed   By: Malachy Moan M.D.   On: 08/31/2014 11:39    Anti-infectives: Anti-infectives   None      Assessment Principal Problem:   Partial SBO vs ileus-clinically  improving   LOS: 3 days   Plan: Remove ng tube.  Sips of water and ice chips today.   Carole Doner J 09/02/2014

## 2014-09-02 NOTE — Progress Notes (Signed)
Patient ID: Yash Cacciola, male   DOB: [birth year], 58 y.o.   MRN: [id number] [facility] PROGRESS NOTE  Atul Delucia [id number] DOB: 1956-09-11 DOA: 08/30/2014 PCP: No primary provider on file.  Brief narrative: 58 y.o. Male with past medical history of peptic ulcer disease who presented to [facility] ED 08/30/2014 with ongoing nausea and vomiting for past few days piror to this admission. Pt also reported having dark stools. He also reported associated lower abdominal pain and increased abdominal girth. On admission, patient was found to have small bowel obstruction with transition point in left lower abdomen. Surgery is seeing the pt in consultation.   Assessment/Plan:   Principal Problem:  SBO (small bowel obstruction)  Unclear etiology. Repeating abdominal x-ray this morning shows persistent gaseous distended loops of small bowel compatible with partial small bowel obstruction. NG tube discontinued 09/01/2014. Per surgery of small sips of water, ice chips.  Appreciate surgery following  DVT Prophylaxis  SCD' bilaterally   Code Status: Full.  Family Communication: plan of care discussed with the patient  Disposition Plan: Home when stable.   IV Access:   Peripheral IV Procedures and diagnostic studies:   Ct Abdomen Pelvis W Contrast 08/30/2014 Small bowel obstruction pattern with left lower abdominal transition. Recommend surgical consultation. Small amount of free intraperitoneal fluid is nonspecific and may be reactive.  Dg Abd 2 Views 08/31/2014 1. Well-positioned gastric tube. 2. Persistent air-filled and dilated small bowel in the mid abdomen with a maximal diameter of 4.9 cm consistent with at least partial small bowel obstruction.  Dg Abd 2 Views 09/01/2014 Interval retraction of enteric tube, tip is in the distal esophagus. Recommend advancement. Persistent gaseous distended loops of small bowel within the central abdomen most compatible with at least partial small bowel  obstruction  Medical Consultants:   Surgery  Other Consultants:   None  Anti-Infectives:   None     Manson Passey, MD  [facility] Pager [phone]  If 7PM-7AM, please contact night-coverage [email or web address] Password [email or web address] 09/02/2014, 9:45 AM   LOS: 3 days    HPI/Subjective: No acute overnight events.  Objective: Filed Vitals:   09/01/14 0422 09/01/14 1351 09/01/14 2110 09/02/14 0510  BP: 152/90 155/66 154/82 155/93  Pulse: 80 80 69 73  Temp: 97.6 F (36.4 C) 98.4 F (36.9 C) 98.5 F (36.9 C) 99.4 F (37.4 C)  TempSrc: Oral Oral Oral Oral  Resp: Height:      Weight:      SpO2: 99% 98% 99% 100%    Intake/Output Summary (Last 24 hours) at 09/02/14 0945 Last data filed at 09/02/14 0600  Gross per 24 hour  Intake   2835 ml  Output   1675 ml  Net   1160 ml    Exam:   General:  Pt is alert, follows commands appropriately, not in acute distress  Cardiovascular: Regular rate and rhythm, S1/S2, no murmurs  Respiratory: Clear to auscultation bilaterally, no wheezing, no crackles, no rhonchi  Abdomen: mildly distended, bowel sounds present  Extremities: No edema, pulses DP and PT palpable bilaterally  Neuro: Grossly nonfocal  Data Reviewed: Basic Metabolic Panel:  Recent Labs Lab 08/30/14 1838 08/31/14 0458  NA 139 140  K 4.3 3.9  CL 99 103  CO2 23 23  GLUCOSE 120* 127*  BUN 22 21  CREATININE 0.94 1.00  CALCIUM 10.3 9.3   Liver Function Tests:  Recent Labs Lab 08/30/14 1838  AST 42*  ALT 51  ALKPHOS 88  BILITOT 0.5  PROT 9.3*  ALBUMIN 4.5   No results found for this basename: LIPASE, AMYLASE,  in the last 168 hours No results found for this basename: AMMONIA,  in the last 168 hours CBC:  Recent Labs Lab 08/30/14 1838 08/31/14 0458  WBC 13.7* 11.3*  NEUTROABS 11.1*  --   HGB 17.9* 15.9  HCT 51.6 46.2  MCV 90.5 90.8  PLT 228 221   Cardiac Enzymes: No results found for this basename: CKTOTAL, CKMB, CKMBINDEX,  TROPONINI,  in the last 168 hours BNP: No components found with this basename: POCBNP,  CBG: No results found for this basename: GLUCAP,  in the last 168 hours  No results found for this or any previous visit (from the past 240 hour(s)).   Scheduled Meds: . LORazepam  0.5 mg Intravenous Once  . pantoprazole (PROTONIX) IV  40 mg Intravenous Q12H   Continuous Infusions: . sodium chloride 100 mL/hr at 09/01/14 2357

## 2014-09-03 NOTE — Progress Notes (Signed)
Patient ID: Caleb Rivers, male   DOB: 08-27-1956, 58 y.o.   MRN: [id number] [facility] PROGRESS NOTE  Caleb Rivers [id number] DOB: 20-Aug-1956 DOA: 08/30/2014 PCP: No primary provider on file.  Brief narrative: 58 y.o. Male with past medical history of peptic ulcer disease who presented to [facility] ED 08/30/2014 with ongoing nausea and vomiting for past few days piror to this admission. Pt also reported having dark stools. He also reported associated lower abdominal pain and increased abdominal girth. On admission, patient was found to have small bowel obstruction with transition point in left lower abdomen. Surgery is seeing the pt in consultation.   Assessment/Plan:   Principal Problem:  SBO (small bowel obstruction)  Unclear etiology. Repeating abdominal x-ray 09/01/2014 showed persistent gaseous distended loops of small bowel compatible with partial small bowel obstruction. NG tube discontinued 09/01/2014.  Patient is clinically better this morning. Diet will be advanced to clear liquids this morning. Appreciate surgery following  DVT Prophylaxis  SCD' bilaterally   Code Status: Full.  Family Communication: plan of care discussed with the patient  Disposition Plan: Home when stable.   IV Access:   Peripheral IV Procedures and diagnostic studies:   Ct Abdomen Pelvis W Contrast 08/30/2014 Small bowel obstruction pattern with left lower abdominal transition. Recommend surgical consultation. Small amount of free intraperitoneal fluid is nonspecific and may be reactive.  Dg Abd 2 Views 08/31/2014 1. Well-positioned gastric tube. 2. Persistent air-filled and dilated small bowel in the mid abdomen with a maximal diameter of 4.9 cm consistent with at least partial small bowel obstruction.  Dg Abd 2 Views 09/01/2014 Interval retraction of enteric tube, tip is in the distal esophagus. Recommend advancement. Persistent gaseous distended loops of small bowel within the central abdomen most  compatible with at least partial small bowel obstruction  Medical Consultants:   Surgery  Other Consultants:   None  Anti-Infectives:   None    Manson Passey, MD  [facility] Pager [phone]  If 7PM-7AM, please contact night-coverage [email or web address] Password [email or web address] 09/03/2014, 9:15 AM   LOS: 4 days    HPI/Subjective: No acute overnight events.  Objective: Filed Vitals:   09/02/14 0510 09/02/14 1400 09/02/14 2145 09/03/14 0514  BP: 155/93 164/82 146/87 131/69  Pulse: 73 63 63 78  Temp: 99.4 F (37.4 C) 98.1 F (36.7 C) 98.5 F (36.9 C) 98.2 F (36.8 C)  TempSrc: Oral Oral Oral Oral  Resp: Height:      Weight:      SpO2: 100% 100% 100% 100%    Intake/Output Summary (Last 24 hours) at 09/03/14 0915 Last data filed at 09/03/14 0438  Gross per 24 hour  Intake 3103.33 ml  Output    150 ml  Net 2953.33 ml    Exam:   General:  Pt is not in distress, walking in the room  Cardiovascular: Regular rate and rhythm, S1/S2, no murmurs  Respiratory: clear to auscultation, no wheezing   Abdomen: less distended, softer to palpation, (+) BS  Extremities: No edema, pulses DP and PT palpable bilaterally  Neuro: Grossly nonfocal  Data Reviewed: Basic Metabolic Panel:  Recent Labs Lab 08/30/14 1838 08/31/14 0458  NA 139 140  K 4.3 3.9  CL 99 103  CO2 23 23  GLUCOSE 120* 127*  BUN 22 21  CREATININE 0.94 1.00  CALCIUM 10.3 9.3   Liver Function Tests:  Recent Labs Lab 08/30/14 1838  AST 42*  ALT 51  ALKPHOS 88  BILITOT 0.5  PROT 9.3*  ALBUMIN 4.5   No results found for this basename: LIPASE, AMYLASE,  in the last 168 hours No results found for this basename: AMMONIA,  in the last 168 hours CBC:  Recent Labs Lab 08/30/14 1838 08/31/14 0458  WBC 13.7* 11.3*  NEUTROABS 11.1*  --   HGB 17.9* 15.9  HCT 51.6 46.2  MCV 90.5 90.8  PLT 228 221   Cardiac Enzymes: No results found for this basename: CKTOTAL, CKMB, CKMBINDEX,  TROPONINI,  in the last 168 hours BNP: No components found with this basename: POCBNP,  CBG: No results found for this basename: GLUCAP,  in the last 168 hours  No results found for this or any previous visit (from the past 240 hour(s)).   Scheduled Meds: . LORazepam  0.5 mg Intravenous Once  . pantoprazole (PROTONIX) IV  40 mg Intravenous Q12H   Continuous Infusions: . sodium chloride 100 mL/hr at 09/03/14 [phone]

## 2014-09-03 NOTE — Progress Notes (Signed)
  Subjective: Passing gas.  No nausea.   Objective: Vital signs in last 24 hours: Temp:  [98.1 F (36.7 C)-98.5 F (36.9 C)] 98.2 F (36.8 C) (09/06 0514) Pulse Rate:  [63-78] 78 (09/06 0514) Resp:  [16] 16 (09/06 0514) BP: (131-164)/(69-87) 131/69 mmHg (09/06 0514) SpO2:  [100 %] 100 % (09/06 0514) Last BM Date: 08/30/14  Intake/Output from previous day: 09/04 0701 - 09/05 0700 In: 2835 [I.V.:2425; NG/GT:410] Out: 1975 [Urine:1525; Emesis/NG output:450-clear Intake/Output this shift:    PE: General- In NAD Abdomen-soft, non-tender, non-distended, + bowel sounds  Lab Results:  No results found for this basename: WBC, HGB, HCT, PLT,  in the last 72 hours BMET No results found for this basename: NA, K, CL, CO2, GLUCOSE, BUN, CREATININE, CALCIUM,  in the last 72 hours PT/INR No results found for this basename: LABPROT, INR,  in the last 72 hours Comprehensive Metabolic Panel:    Component Value Date/Time   NA 140 08/31/2014 0458   NA 139 08/30/2014 1838   K 3.9 08/31/2014 0458   K 4.3 08/30/2014 1838   CL 103 08/31/2014 0458   CL 99 08/30/2014 1838   CO2 23 08/31/2014 0458   CO2 23 08/30/2014 1838   BUN 21 08/31/2014 0458   BUN 22 08/30/2014 1838   CREATININE 1.00 08/31/2014 0458   CREATININE 0.94 08/30/2014 1838   GLUCOSE 127* 08/31/2014 0458   GLUCOSE 120* 08/30/2014 1838   CALCIUM 9.3 08/31/2014 0458   CALCIUM 10.3 08/30/2014 1838   AST 42* 08/30/2014 1838   AST 43* 09/25/2013 0800   ALT 51 08/30/2014 1838   ALT 48 09/25/2013 0800   ALKPHOS 88 08/30/2014 1838   ALKPHOS 77 09/25/2013 0800   BILITOT 0.5 08/30/2014 1838   BILITOT 0.4 09/25/2013 0800   PROT 9.3* 08/30/2014 1838   PROT 8.3 09/25/2013 0800   ALBUMIN 4.5 08/30/2014 1838   ALBUMIN 4.0 09/25/2013 0800     Studies/Results: No results found.  Anti-infectives: Anti-infectives   None      Assessment Principal Problem:   Partial SBO vs ileus-continues to improve.   LOS: 4 days   Plan: Clear liquids.   Caleb Rivers  J 09/03/2014

## 2014-09-04 DIAGNOSIS — R1115 Cyclical vomiting syndrome unrelated to migraine: Secondary | ICD-10-CM

## 2014-09-04 MED ORDER — TRAMADOL HCL 50 MG PO TABS: 50.0000 mg | ORAL_TABLET | Freq: Four times a day (QID) | ORAL | Status: DC | PRN

## 2014-09-04 NOTE — Discharge Instructions (Signed)
Small Bowel Obstruction °A small bowel obstruction is a blockage (obstruction) of the small intestine (small bowel). The small bowel is a long, slender tube that connects the stomach to the colon. Its job is to absorb nutrients from the fluids and foods you consume into the bloodstream.  °CAUSES  °There are many causes of intestinal blockage. The most common ones include: °· Hernias. This is a more common cause in children than adults. °· Inflammatory bowel disease (enteritis and colitis). °· Twisting of the bowel (volvulus). °· Tumors. °· Scar tissue (adhesions) from previous surgery or radiation treatment. °· Recent surgery. This may cause an acute small bowel obstruction called an ileus. °SYMPTOMS  °· Abdominal pain. This may be dull cramps or sharp pain. It may occur in one area or may be present in the entire abdomen. Pain can range from mild to severe, depending on the degree of obstruction. °· Nausea and vomiting. Vomit may be greenish or yellow bile color. °· Distended or swollen stomach. Abdominal bloating is a common symptom. °· Constipation. °· Lack of passing gas. °· Frequent belching. °· Diarrhea. This may occur if runny stool is able to leak around the obstruction. °DIAGNOSIS  °Your caregiver can usually diagnose small bowel obstruction by taking a history, doing a physical exam, and taking X-rays. If the cause is unclear, a CT scan (computerized tomography) of your abdomen and pelvis may be needed. °TREATMENT  °Treatment of the blockage depends on the cause and how bad the problem is.  °· Sometimes, the obstruction improves with bed rest and intravenous (IV) fluids. °· Resting the bowel is very important. This means following a simple diet. Sometimes, a clear liquid diet may be required for several days. °· Sometimes, a small tube (nasogastric tube) is placed into the stomach to decompress the bowel. When the bowel is blocked, it usually swells up like a balloon filled with air and fluids.  Decompression means that the air and fluids are removed by suction through that tube. This can help with pain, discomfort, and nausea. It can also help the obstruction resolve faster. °· Surgery may be required if other treatments do not work. Bowel obstruction from a hernia may require early surgery and can be an emergency procedure. Adhesions that cause frequent or severe obstructions may also require surgery. °HOME CARE INSTRUCTIONS °If your bowel obstruction is only partial or incomplete, you may be allowed to go home. °· Get plenty of rest. °· Follow your diet as directed by your caregiver. °· Only consume clear liquids until your condition improves. °· Avoid solid foods as instructed. °SEEK IMMEDIATE MEDICAL CARE IF: °· You have increased pain or cramping. °· You vomit blood. °· You have uncontrolled vomiting or nausea. °· You cannot drink fluids due to vomiting or pain. °· You develop confusion. °· You begin feeling very dry or thirsty (dehydrated). °· You have severe bloating. °· You have chills. °· You have a fever. °· You feel extremely weak or you faint. °MAKE SURE YOU: °· Understand these instructions. °· Will watch your condition. °· Will get help right away if you are not doing well or get worse. °Document Released: 03/03/2006 Document Revised: 03/08/2012 Document Reviewed: 02/28/2011 °[employer]® Patient Information ©2015 [employer]. This information is not intended to replace advice given to you by your health care provider. Make sure you discuss any questions you have with your health care provider. ° °

## 2014-09-04 NOTE — Discharge Summary (Signed)
Physician Discharge Summary  Caleb Rivers WUJ:[id number] DOB: 21-Jan-1956 DOA: 08/30/2014  PCP: No primary provider on file.  Admit date: 08/30/2014 Discharge date: 09/04/2014  Recommendations for Outpatient Follow-up:  1. Patient tolerates regular diet prior to discharge. Patient instructed to followup with primary care physician in one to 2 weeks to make sure symptoms are controlled. He is  Discharge Diagnoses:  Principal Problem:   SBO (small bowel obstruction) Active Problems:   Abdominal pain, generalized   Peptic ulcer disease   Nausea and vomiting    Discharge Condition: stable   Diet recommendation: as tolerated   History of present illness:  58 y.o. Male with past medical history of peptic ulcer disease who presented to [facility] ED 08/30/2014 with ongoing nausea and vomiting for past few days piror to this admission. Pt also reported having dark stools. He also reported associated lower abdominal pain and increased abdominal girth. On admission, patient was found to have small bowel obstruction with transition point in left lower abdomen. Surgery is seeing the pt in consultation.   Assessment/Plan:   Principal Problem:  SBO (small bowel obstruction)  Unclear etiology. Repeating abdominal x-ray 09/01/2014 showed persistent gaseous distended loops of small bowel compatible with partial small bowel obstruction. NG tube discontinued 09/01/2014 because pt is clinically better.  Diet will be advanced to regula.  Stable for discharge.  DVT Prophylaxis  SCD' bilaterally while in hospital.    Code Status: Full.  Family Communication: plan of care discussed with the patient    IV Access:   Peripheral IV Procedures and diagnostic studies:   Ct Abdomen Pelvis W Contrast 08/30/2014 Small bowel obstruction pattern with left lower abdominal transition. Recommend surgical consultation. Small amount of free intraperitoneal fluid is nonspecific and may be reactive.  Dg Abd 2 Views 08/31/2014 1.  Well-positioned gastric tube. 2. Persistent air-filled and dilated small bowel in the mid abdomen with a maximal diameter of 4.9 cm consistent with at least partial small bowel obstruction.  Dg Abd 2 Views 09/01/2014 Interval retraction of enteric tube, tip is in the distal esophagus. Recommend advancement. Persistent gaseous distended loops of small bowel within the central abdomen most compatible with at least partial small bowel obstruction  Medical Consultants:   Surgery  Other Consultants:   None  Anti-Infectives:   None    Signed:  Manson Passey, MD  [facility] 09/04/2014, 11:14 AM  Pager #: [phone]  Discharge Exam: Filed Vitals:   09/04/14 0527  BP: 148/90  Pulse: 70  Temp: 98.6 F (37 C)  Resp: 18   Filed Vitals:   09/03/14 0514 09/03/14 1408 09/03/14 2124 09/04/14 0527  BP: 131/69 139/80 144/85 148/90  Pulse: 78 78 62 70  Temp: 98.2 F (36.8 C) 98.9 F (37.2 C) 98.7 F (37.1 C) 98.6 F (37 C)  TempSrc: Oral Oral Oral Oral  Resp: Height:      Weight:      SpO2: 100% 100% 99% 100%    General: Pt is not in acute distress Cardiovascular: Regular rate and rhythm, Respiratory: Clear to auscultation bilaterally, no wheezing Abdominal: Soft, non tender, non distended, bowel sounds +, no guarding Extremities: no edema, no cyanosis, pulses palpable bilaterally DP and PT Neuro: Grossly nonfocal  Discharge Instructions  Discharge Instructions   Call MD for:  difficulty breathing, headache or visual disturbances    Complete by:  As directed      Call MD for:  persistant dizziness or light-headedness  Complete by:  As directed      Call MD for:  persistant nausea and vomiting    Complete by:  As directed      Call MD for:  severe uncontrolled pain    Complete by:  As directed      Diet - low sodium heart healthy    Complete by:  As directed      Increase activity slowly    Complete by:  As directed             Medication List          lansoprazole 15 MG disintegrating tablet  Commonly known as:  PREVACID SOLUTAB  Take 15 mg by mouth daily at 12 noon.     multivitamin with minerals Tabs tablet  Take 1 tablet by mouth daily.     traMADol 50 MG tablet  Commonly known as:  ULTRAM  Take 1 tablet (50 mg total) by mouth every 6 (six) hours as needed for moderate pain.          The results of significant diagnostics from this hospitalization (including imaging, microbiology, ancillary and laboratory) are listed below for reference.    Significant Diagnostic Studies: Ct Abdomen Pelvis W Contrast  08/30/2014   CLINICAL DATA:  Mid abdominal pain, rectal bleeding.  EXAM: CT ABDOMEN AND PELVIS WITH CONTRAST  TECHNIQUE: Multidetector CT imaging of the abdomen and pelvis was performed using the standard protocol following bolus administration of intravenous contrast.  CONTRAST:  50mL OMNIPAQUE IOHEXOL 300 MG/ML SOLN, OMNIPAQUE IOHEXOL 300 MG/ML SOLN  COMPARISON:  03/13/2012  FINDINGS: Lung bases clear.  Normal heart size.  No appreciable abnormality of the liver, spleen, pancreas, biliary system, adrenal glands, kidneys. No hydroureteronephrosis.  Colon is decompressed as are distal small bowel loops. Dilated proximal small bowel loops up to 3.5 cm with air-fluid levels to the level of the fecalized segment in the left lower quadrant and abrupt transition at a kinked segment (coronal image 66). Small mild interloop fluid and perihepatic fluid. No free intraperitoneal air. Mild mesenteric edema. No lymphadenopathy.  Normal caliber aorta and branch vessels.  Thin walled bladder.  Normal size prostate gland.  Mild multilevel degenerative change without acute osseous finding.  IMPRESSION: Small bowel obstruction pattern with left lower abdominal transition. Recommend surgical consultation.  Small amount of free intraperitoneal fluid is nonspecific and may be reactive.  Critical Value/emergent results were called by telephone at the  time of interpretation on 08/30/2014 at 10:27 pm to Dr. Chaney Malling , who verbally acknowledged these results.   Electronically Signed   By: Jearld Lesch M.D.   On: 08/30/2014 22:28   Dg Abd 2 Views  09/01/2014   CLINICAL DATA:  Follow-up small bowel obstruction.  EXAM: ABDOMEN - 2 VIEW  COMPARISON:  Radiograph 08/31/2014  FINDINGS: Oral contrast material is demonstrated throughout the transverse and descending colon. There are persistent gaseous distended loops of small bowel within the central abdomen (4.5 cm). No definite pneumatosis or portal venous gas. No definite free intraperitoneal air. Interval retraction of the enteric tube with the tip at the distal esophagus. Regional skeleton is unremarkable. Pelvic phleboliths.  IMPRESSION: Interval retraction of enteric tube, tip is in the distal esophagus. Recommend advancement.  Persistent gaseous distended loops of small bowel within the central abdomen most compatible with at least partial small bowel obstruction  These results will be called to the ordering clinician or representative by the Radiologist Assistant, and communication documented in the  PACS or zVision Dashboard.   Electronically Signed   By: Annia Belt M.D.   On: 09/01/2014 07:50   Dg Abd 2 Views  08/31/2014   CLINICAL DATA:  Small bowel obstruction, nasogastric tube placement  EXAM: ABDOMEN - 2 VIEW  COMPARISON:  CT abdomen/ pelvis 08/30/2014  FINDINGS: Well-positioned gastric tube. The tip of the tube overlies the gastric body.  No evidence of free air on the upright view. Numerous loops of air-filled and dilated small bowel are present throughout the abdomen. Maximal small bowel diameter is 4.9 cm. Excreted contrast material is present within the the urinary bladder. Of note, no oral contrast material is present within the bowel. No acute osseous abnormality. Negative for ascites.  IMPRESSION: 1. Well-positioned gastric tube. 2. Persistent air-filled and dilated small bowel in the mid  abdomen with a maximal diameter of 4.9 cm consistent with at least partial small bowel obstruction.   Electronically Signed   By: Malachy Moan M.D.   On: 08/31/2014 11:39    Microbiology: No results found for this or any previous visit (from the past 240 hour(s)).   Labs: Basic Metabolic Panel:  Recent Labs Lab 08/30/14 1838 08/31/14 0458  NA 139 140  K 4.3 3.9  CL 99 103  CO2 23 23  GLUCOSE 120* 127*  BUN 22 21  CREATININE 0.94 1.00  CALCIUM 10.3 9.3   Liver Function Tests:  Recent Labs Lab 08/30/14 1838  AST 42*  ALT 51  ALKPHOS 88  BILITOT 0.5  PROT 9.3*  ALBUMIN 4.5   No results found for this basename: LIPASE, AMYLASE,  in the last 168 hours No results found for this basename: AMMONIA,  in the last 168 hours CBC:  Recent Labs Lab 08/30/14 1838 08/31/14 0458  WBC 13.7* 11.3*  NEUTROABS 11.1*  --   HGB 17.9* 15.9  HCT 51.6 46.2  MCV 90.5 90.8  PLT 228 221   Cardiac Enzymes: No results found for this basename: CKTOTAL, CKMB, CKMBINDEX, TROPONINI,  in the last 168 hours BNP: BNP (last 3 results) No results found for this basename: PROBNP,  in the last 8760 hours CBG: No results found for this basename: GLUCAP,  in the last 168 hours  Time coordinating discharge: Over 30 minutes    And

## 2014-09-04 NOTE — Progress Notes (Signed)
  Subjective: Tolerating soft diet.  Bowels moving.   Objective: Vital signs in last 24 hours: Temp:  [98.6 F (37 C)-98.9 F (37.2 C)] 98.6 F (37 C) (09/07 0527) Pulse Rate:  [62-78] 70 (09/07 0527) Resp:  [18] 18 (09/07 0527) BP: (139-148)/(80-90) 148/90 mmHg (09/07 0527) SpO2:  [99 %-100 %] 100 % (09/07 0527) Last BM Date: 09/03/14  Intake/Output from previous day: 09/04 0701 - 09/05 0700 In: 2835 [I.V.:2425; NG/GT:410] Out: 1975 [Urine:1525; Emesis/NG output:450-clear Intake/Output this shift:    PE: General- In NAD Abdomen-soft, non-tender, non-distended  Lab Results:  No results found for this basename: WBC, HGB, HCT, PLT,  in the last 72 hours BMET No results found for this basename: NA, K, CL, CO2, GLUCOSE, BUN, CREATININE, CALCIUM,  in the last 72 hours PT/INR No results found for this basename: LABPROT, INR,  in the last 72 hours Comprehensive Metabolic Panel:    Component Value Date/Time   NA 140 08/31/2014 0458   NA 139 08/30/2014 1838   K 3.9 08/31/2014 0458   K 4.3 08/30/2014 1838   CL 103 08/31/2014 0458   CL 99 08/30/2014 1838   CO2 23 08/31/2014 0458   CO2 23 08/30/2014 1838   BUN 21 08/31/2014 0458   BUN 22 08/30/2014 1838   CREATININE 1.00 08/31/2014 0458   CREATININE 0.94 08/30/2014 1838   GLUCOSE 127* 08/31/2014 0458   GLUCOSE 120* 08/30/2014 1838   CALCIUM 9.3 08/31/2014 0458   CALCIUM 10.3 08/30/2014 1838   AST 42* 08/30/2014 1838   AST 43* 09/25/2013 0800   ALT 51 08/30/2014 1838   ALT 48 09/25/2013 0800   ALKPHOS 88 08/30/2014 1838   ALKPHOS 77 09/25/2013 0800   BILITOT 0.5 08/30/2014 1838   BILITOT 0.4 09/25/2013 0800   PROT 9.3* 08/30/2014 1838   PROT 8.3 09/25/2013 0800   ALBUMIN 4.5 08/30/2014 1838   ALBUMIN 4.0 09/25/2013 0800     Studies/Results: No results found.  Anti-infectives: Anti-infectives   None      Assessment Principal Problem:   Partial SBO vs ileus-resolved; etiology of this is unclear.  Partial SBO could be due to an adhesion from a previous  inflammatory process.   LOS: 5 days   Plan: Okay for discharge from my standpoint.  Recommend outpatient colonoscopy.  Follow up with [facility], prn.   Caleb Rivers J 09/04/2014

## 2014-09-04 NOTE — Progress Notes (Signed)
MD included discharge instructions stating " For questions regarding your discharge instructions, please call the department you were discharged from and have the Nurse page the Hospitalist on call. Discharging department phone number:[phone]" Patients cannot be seen by a Hospitalist once they leave the hospital. Rn paged attending MD, Elisabeth Pigeon about setting the patient up with a follow up appointment.   Patient needs a contact number, and resources, not a unit number in a hospital.   MD corrected and set patient up with a [facility] visit in 2 weeks.

## 2014-09-07 ENCOUNTER — Emergency Department (HOSPITAL_COMMUNITY)
Admission: EM | Admit: 2014-09-07 | Discharge: 2014-09-07 | Disposition: A | Discharge status: Home / Self Care | Payer: BC Managed Care – PPO | Attending: Emergency Medicine | Admitting: Emergency Medicine

## 2014-09-07 ENCOUNTER — Encounter (HOSPITAL_COMMUNITY): Payer: Self-pay | Admitting: Emergency Medicine

## 2014-09-07 ENCOUNTER — Emergency Department (HOSPITAL_COMMUNITY): Payer: BC Managed Care – PPO

## 2014-09-07 DIAGNOSIS — K5909 Other constipation: Secondary | ICD-10-CM

## 2014-09-07 DIAGNOSIS — K5900 Constipation, unspecified: Secondary | ICD-10-CM | POA: Insufficient documentation

## 2014-09-07 DIAGNOSIS — R110 Nausea: Secondary | ICD-10-CM | POA: Insufficient documentation

## 2014-09-07 DIAGNOSIS — Z79899 Other long term (current) drug therapy: Secondary | ICD-10-CM | POA: Insufficient documentation

## 2014-09-07 DIAGNOSIS — Z872 Personal history of diseases of the skin and subcutaneous tissue: Secondary | ICD-10-CM | POA: Insufficient documentation

## 2014-09-07 DIAGNOSIS — R109 Unspecified abdominal pain: Secondary | ICD-10-CM | POA: Insufficient documentation

## 2014-09-07 LAB — CBC WITH DIFFERENTIAL/PLATELET
Basophils Absolute: 0 10*3/uL (ref 0.0–0.1)
Basophils Relative: 0 % (ref 0–1)
EOS ABS: 0.1 10*3/uL (ref 0.0–0.7)
Eosinophils Relative: 1 % (ref 0–5)
HCT: 49 % (ref 39.0–52.0)
Hemoglobin: 17 g/dL (ref 13.0–17.0)
LYMPHS ABS: 2.2 10*3/uL (ref 0.7–4.0)
Lymphocytes Relative: 26 % (ref 12–46)
MCH: 32 pg (ref 26.0–34.0)
MCHC: 34.7 g/dL (ref 30.0–36.0)
MCV: 92.3 fL (ref 78.0–100.0)
Monocytes Absolute: 0.8 10*3/uL (ref 0.1–1.0)
Monocytes Relative: 10 % (ref 3–12)
NEUTROS ABS: 5.1 10*3/uL (ref 1.7–7.7)
NEUTROS PCT: 63 % (ref 43–77)
Platelets: 245 10*3/uL (ref 150–400)
RBC: 5.31 MIL/uL (ref 4.22–5.81)
RDW: 14.4 % (ref 11.5–15.5)
WBC: 8.2 10*3/uL (ref 4.0–10.5)

## 2014-09-07 LAB — COMPREHENSIVE METABOLIC PANEL
ALBUMIN: 4.2 g/dL (ref 3.5–5.2)
ALT: 31 U/L (ref 0–53)
AST: 32 U/L (ref 0–37)
Alkaline Phosphatase: 73 U/L (ref 39–117)
Anion gap: 12 (ref 5–15)
BUN: 16 mg/dL (ref 6–23)
CHLORIDE: 97 meq/L (ref 96–112)
CO2: 27 mEq/L (ref 19–32)
Calcium: 10 mg/dL (ref 8.4–10.5)
Creatinine, Ser: 1.23 mg/dL (ref 0.50–1.35)
GFR calc Af Amer: 74 mL/min — ABNORMAL LOW (ref >90)
GFR calc non Af Amer: 64 mL/min — ABNORMAL LOW (ref >90)
Glucose, Bld: 105 mg/dL — ABNORMAL HIGH (ref 70–99)
Potassium: 4.9 mEq/L (ref 3.7–5.3)
SODIUM: 136 meq/L — AB (ref 137–147)
TOTAL PROTEIN: 8.9 g/dL — AB (ref 6.0–8.3)
Total Bilirubin: 0.5 mg/dL (ref 0.3–1.2)

## 2014-09-07 LAB — LIPASE, BLOOD: LIPASE: 27 U/L (ref 11–59)

## 2014-09-07 MED ORDER — ONDANSETRON 4 MG PO TBDP: ORAL_TABLET | ORAL | Status: DC

## 2014-09-07 MED ORDER — SODIUM CHLORIDE 0.9 % IV BOLUS (SEPSIS)
1000.0000 mL | INTRAVENOUS | Status: AC
  Administered 2014-09-07: 1000 mL via INTRAVENOUS

## 2014-09-07 MED ORDER — ONDANSETRON HCL 4 MG/2ML IJ SOLN
4.0000 mg | Freq: Once | INTRAMUSCULAR | Status: AC
  Administered 2014-09-07: 4 mg via INTRAVENOUS
  Filled 2014-09-07: qty 2

## 2014-09-07 NOTE — ED Notes (Signed)
Attempted IV placement x2 with both attempts unsuccessful. Requested assistance for another RN with x2 attempts both unsuccessful. IV team contacted.

## 2014-09-07 NOTE — ED Notes (Signed)
Pt has translator at bedside.

## 2014-09-07 NOTE — ED Notes (Signed)
Pt was released from hospital on Monday diagnosed with bowel obstructionand has not had a BM since. Pt states he has been eating soup since then but every time he eats he gets nauseous. Denies pain. Pt is on tramadol. Pt does not speak English but does have Nurse, learning disability.

## 2014-09-07 NOTE — ED Provider Notes (Signed)
CSN: [id number]     Arrival date & time 09/07/14  0908 History   First MD Initiated Contact with Patient 09/07/14 1121     Chief Complaint  Patient presents with  . Abdominal Pain  . Nausea     (Consider location/radiation/quality/duration/timing/severity/associated sxs/prior Treatment) Patient is a 58 y.o. male presenting with constipation. The history is provided by the patient.  Constipation Severity:  Mild Time since last bowel movement:  2 days Timing:  Constant Progression:  Unchanged Chronicity:  New Context comment:  Recent sbo Stool description: small bm 3 days ago. Relieved by:  Nothing Worsened by:  Nothing tried Ineffective treatments:  None tried Associated symptoms: nausea   Associated symptoms: no abdominal pain, no diarrhea, no dysuria, no fever and no vomiting     Past Medical History  Diagnosis Date  . Ulcer   . Constipation    History reviewed. No pertinent past surgical history. No family history on file. History  Substance Use Topics  . Smoking status: Never Smoker   . Smokeless tobacco: Not on file  . Alcohol Use: No    Review of Systems  Constitutional: Negative for fever.  HENT: Negative for drooling and rhinorrhea.   Eyes: Negative for pain.  Respiratory: Negative for cough and shortness of breath.   Cardiovascular: Negative for chest pain and leg swelling.  Gastrointestinal: Positive for nausea and constipation. Negative for vomiting, abdominal pain and diarrhea.  Genitourinary: Negative for dysuria and hematuria.  Musculoskeletal: Negative for gait problem and neck pain.  Skin: Negative for color change.  Neurological: Negative for numbness and headaches.  Hematological: Negative for adenopathy.  Psychiatric/Behavioral: Negative for behavioral problems.  All other systems reviewed and are negative.     Allergies  Asa; Nsaids; and Peanut-containing drug products  Home Medications   Prior to Admission medications   Medication  Sig Start Date End Date Taking? Authorizing Provider  Multiple Vitamin (MULTIVITAMIN WITH MINERALS) TABS tablet Take 1 tablet by mouth daily.   Yes Historical Provider, MD  traMADol (ULTRAM) 50 MG tablet Take 1 tablet (50 mg total) by mouth every 6 (six) hours as needed for moderate pain. 09/04/14  Yes Alison Murray, MD   BP 149/102  Pulse 87  Temp(Src) 98.6 F (37 C) (Oral)  Resp 20  SpO2 99% Physical Exam  Nursing note and vitals reviewed. Constitutional: He is oriented to person, place, and time. He appears well-developed and well-nourished.  HENT:  Head: Normocephalic and atraumatic.  Right Ear: External ear normal.  Left Ear: External ear normal.  Nose: Nose normal.  Mouth/Throat: Oropharynx is clear and moist. No oropharyngeal exudate.  Eyes: Conjunctivae and EOM are normal. Pupils are equal, round, and reactive to light.  Neck: Normal range of motion. Neck supple.  Cardiovascular: Normal rate, regular rhythm, normal heart sounds and intact distal pulses.  Exam reveals no gallop and no friction rub.   No murmur heard. Pulmonary/Chest: Effort normal and breath sounds normal. No respiratory distress. He has no wheezes.  Abdominal: Soft. Bowel sounds are normal. He exhibits no distension. There is no tenderness. There is no rebound and no guarding.  Musculoskeletal: Normal range of motion. He exhibits no edema and no tenderness.  Neurological: He is alert and oriented to person, place, and time.  Skin: Skin is warm and dry.  Psychiatric: He has a normal mood and affect. His behavior is normal.    ED Course  Procedures (including critical care time) Labs Review Labs Reviewed  COMPREHENSIVE METABOLIC  PANEL - Abnormal; Notable for the following:    Sodium 136 (*)    Glucose, Bld 105 (*)    Total Protein 8.9 (*)    GFR calc non Af Amer 64 (*)    GFR calc Af Amer 74 (*)    All other components within normal limits  CBC WITH DIFFERENTIAL  LIPASE, BLOOD    Imaging Review Dg  Abd 2 Views  09/07/2014   CLINICAL DATA:  58 year old male with history small bowel obstruction. Nausea.  EXAM: ABDOMEN - 2 VIEW  COMPARISON:  Plain film for comparison 09/01/2014 08/31/2014. Abdomen CT 08/30/2014  FINDINGS: CT 7 throughout the centralized small bowel loops and to the length of the colon without distention of colonic or small bowel loops. There has been evacuation of the retained oral contrast present on the comparison plain film. Small amount of formed stool within the rectum.  No free abdominal air identified.  Unremarkable appearance the lung bases. Unremarkable appearance of the visualized cardiomediastinal silhouette.  No displaced acute fracture identified.  No abdominal calcifications. Phleboliths within the anatomic pelvis.  IMPRESSION: Resolving small bowel obstruction, with interval evacuation of retained oral contrast.  Signed,  Yvone Neu. Loreta Ave, DO  [facility]  [facility]   Electronically Signed   By: Gilmer Mor O.D.   On: 09/07/2014 13:32     EKG Interpretation None      MDM   Final diagnoses:  Nausea  Other constipation    11:41 AM 58 y.o. male w hx of recent admission for sbo who pw constipation. No bm for the last 2 days although he did have a small bm 3 days ago (day of d/c). He has some nausea but no pain or vomiting. AFVSS here, abd soft and benign.   2:34 PM: I interpreted/reviewed the labs and/or imaging which were non-contributory.  Pt continues to appear well. Will provide Rx for zofran. Pt currently on liquid diet.  I have discussed the diagnosis/risks/treatment options with the patient and believe the pt to be eligible for discharge home to follow-up with his pcp for repeat eval. We also discussed returning to the ED immediately if new or worsening sx occur. We discussed the sx which are most concerning (e.g., abd pain, vomiting, fever) that necessitate immediate return. Medications administered to the  patient during their visit and any new prescriptions provided to the patient are listed below.  Medications given during this visit Medications  sodium chloride 0.9 % bolus 1,000 mL (0 mLs Intravenous Stopped 09/07/14 1414)  ondansetron (ZOFRAN) injection 4 mg (4 mg Intravenous Given 09/07/14 1256)    New Prescriptions   No medications on file     Purvis Sheffield, MD 09/07/14 1934

## 2014-12-07 ENCOUNTER — Encounter (HOSPITAL_COMMUNITY): Payer: Self-pay | Admitting: Cardiovascular Disease

## 2016-06-07 ENCOUNTER — Encounter (HOSPITAL_COMMUNITY): Payer: Self-pay | Admitting: Emergency Medicine

## 2016-06-07 ENCOUNTER — Emergency Department (HOSPITAL_COMMUNITY)
Admission: EM | Admit: 2016-06-07 | Discharge: 2016-06-07 | Disposition: A | Discharge status: Home / Self Care | Payer: No Typology Code available for payment source | Attending: Emergency Medicine | Admitting: Emergency Medicine

## 2016-06-07 DIAGNOSIS — Z79899 Other long term (current) drug therapy: Secondary | ICD-10-CM | POA: Insufficient documentation

## 2016-06-07 DIAGNOSIS — Z91010 Allergy to peanuts: Secondary | ICD-10-CM | POA: Insufficient documentation

## 2016-06-07 DIAGNOSIS — K219 Gastro-esophageal reflux disease without esophagitis: Secondary | ICD-10-CM | POA: Insufficient documentation

## 2016-06-07 LAB — BASIC METABOLIC PANEL
Anion gap: 7 (ref 5–15)
BUN: 17 mg/dL (ref 6–20)
CHLORIDE: 105 mmol/L (ref 101–111)
CO2: 23 mmol/L (ref 22–32)
CREATININE: 1.08 mg/dL (ref 0.61–1.24)
Calcium: 9.2 mg/dL (ref 8.9–10.3)
GFR calc non Af Amer: >60 mL/min (ref >60)
Glucose, Bld: 129 mg/dL — ABNORMAL HIGH (ref 65–99)
POTASSIUM: 3.9 mmol/L (ref 3.5–5.1)
Sodium: 135 mmol/L (ref 135–145)

## 2016-06-07 LAB — CBC
HCT: 48.8 % (ref 39.0–52.0)
Hemoglobin: 17 g/dL (ref 13.0–17.0)
MCH: 31.5 pg (ref 26.0–34.0)
MCHC: 34.8 g/dL (ref 30.0–36.0)
MCV: 90.5 fL (ref 78.0–100.0)
PLATELETS: 233 10*3/uL (ref 150–400)
RBC: 5.39 MIL/uL (ref 4.22–5.81)
RDW: 14.3 % (ref 11.5–15.5)
WBC: 7.8 10*3/uL (ref 4.0–10.5)

## 2016-06-07 LAB — I-STAT TROPONIN, ED: Troponin i, poc: 0.01 ng/mL (ref 0.00–0.08)

## 2016-06-07 LAB — LIPASE, BLOOD: LIPASE: 27 U/L (ref 11–51)

## 2016-06-07 MED ORDER — FAMOTIDINE IN NACL 20-0.9 MG/50ML-% IV SOLN
20.0000 mg | Freq: Once | INTRAVENOUS | Status: AC
  Administered 2016-06-07: 20 mg via INTRAVENOUS
  Filled 2016-06-07: qty 50

## 2016-06-07 MED ORDER — ONDANSETRON 4 MG PO TBDP: ORAL_TABLET | ORAL | Status: DC

## 2016-06-07 MED ORDER — GI COCKTAIL ~~LOC~~
30.0000 mL | Freq: Once | ORAL | Status: AC
  Administered 2016-06-07: 30 mL via ORAL
  Filled 2016-06-07: qty 30

## 2016-06-07 MED ORDER — PANTOPRAZOLE SODIUM 20 MG PO TBEC: 20.0000 mg | DELAYED_RELEASE_TABLET | Freq: Every day | ORAL | Status: DC

## 2016-06-07 NOTE — Discharge Instructions (Signed)
Follow up with [facility] or your family md in 1-2 weeks

## 2016-06-07 NOTE — ED Notes (Addendum)
Per pt reports chest "burning" x 3 days radiating to back, Hx gastric ulcer, sts that  pain is possibly caused by gastric ulcer. denies shortness of breath . Also reports hiccup and nausea.  Pt speaks Wol[place] per family

## 2016-06-07 NOTE — ED Provider Notes (Signed)
CSN: [id number]     Arrival date & time 06/07/16  1306 History   First MD Initiated Contact with Patient 06/07/16 1434     Chief Complaint  Patient presents with  . Chest Pain     (Consider location/radiation/quality/duration/timing/severity/associated sxs/prior Treatment) Patient is a 60 y.o. male presenting with chest pain. The history is provided by the patient (Patient complains of epigastric discomfort. Patient history of GERD).  Chest Pain Pain location:  Epigastric Pain quality: aching   Pain radiates to:  Does not radiate Pain radiates to the back: yes   Pain severity:  Mild Onset quality:  Gradual Timing:  Intermittent Progression:  Waxing and waning Associated symptoms: abdominal pain   Associated symptoms: no back pain, no cough, no fatigue and no headache     Past Medical History  Diagnosis Date  . Ulcer   . Constipation    Past Surgical History  Procedure Laterality Date  . Left heart catheterization with coronary angiogram N/A 09/25/2013    Procedure: LEFT HEART CATHETERIZATION WITH CORONARY ANGIOGRAM;  Surgeon: Lennette Biharihomas A Kelly, MD;  [facility];  Service: Cardiovascular;  Laterality: N/A;   No family history on file. Social History  Substance Use Topics  . Smoking status: Never Smoker   . Smokeless tobacco: None  . Alcohol Use: No    Review of Systems  Constitutional: Negative for appetite change and fatigue.  HENT: Negative for congestion, ear discharge and sinus pressure.   Eyes: Negative for discharge.  Respiratory: Negative for cough.   Cardiovascular: Positive for chest pain.  Gastrointestinal: Positive for abdominal pain. Negative for diarrhea.  Genitourinary: Negative for frequency and hematuria.  Musculoskeletal: Negative for back pain.  Skin: Negative for rash.  Neurological: Negative for seizures and headaches.  Psychiatric/Behavioral: Negative for hallucinations.      Allergies  Asa; Nsaids; and Peanut-containing drug  products  Home Medications   Prior to Admission medications   Medication Sig Start Date End Date Taking? Authorizing Provider  Multiple Vitamin (MULTIVITAMIN WITH MINERALS) TABS tablet Take 1 tablet by mouth daily.   Yes Historical Provider, MD  ondansetron (ZOFRAN ODT) 4 MG disintegrating tablet 4mg  ODT q4 hours prn nausea/vomit 06/07/16   Bethann BerkshireJoseph Perri Lamagna, MD  pantoprazole (PROTONIX) 20 MG tablet Take 1 tablet (20 mg total) by mouth daily. 06/07/16   Bethann BerkshireJoseph Claudia Greenley, MD  traMADol (ULTRAM) 50 MG tablet Take 1 tablet (50 mg total) by mouth every 6 (six) hours as needed for moderate pain. Patient not taking: Reported on 06/07/2016 09/04/14   Alison MurrayAlma M Devine, MD   BP 148/88 mmHg  Pulse 94  Temp(Src) 98.2 F (36.8 C) (Oral)  Resp 18  SpO2 99% Physical Exam  Constitutional: He is oriented to person, place, and time. He appears well-developed.  HENT:  Head: Normocephalic.  Eyes: Conjunctivae and EOM are normal. No scleral icterus.  Neck: Neck supple. No thyromegaly present.  Cardiovascular: Normal rate and regular rhythm.  Exam reveals no gallop and no friction rub.   No murmur heard. Pulmonary/Chest: No stridor. He has no wheezes. He has no rales. He exhibits no tenderness.  Abdominal: He exhibits no distension. There is tenderness. There is no rebound.  Mild epigastric tenderness  Musculoskeletal: Normal range of motion. He exhibits no edema.  Lymphadenopathy:    He has no cervical adenopathy.  Neurological: He is oriented to person, place, and time. He exhibits normal muscle tone. Coordination normal.  Skin: No rash noted. No erythema.  Psychiatric: He has a normal  mood and affect. His behavior is normal.    ED Course  Procedures (including critical care time) Labs Review Labs Reviewed  BASIC METABOLIC PANEL - Abnormal; Notable for the following:    Glucose, Bld 129 (*)    All other components within normal limits  CBC  LIPASE, BLOOD  I-STAT TROPOININ, ED    Imaging Review No  results found. I have personally reviewed and evaluated these images and lab results as part of my medical decision-making.   EKG Interpretation   Date/Time:  Saturday June 07 2016 13:17:02 EDT Ventricular Rate:  85 PR Interval:  134 QRS Duration: 83 QT Interval:  351 QTC Calculation: 417 R Axis:   -43 Text Interpretation:  Sinus rhythm Consider right atrial enlargement Left  axis deviation ST elevation, consider anterior injury No significant  change since last tracing Confirmed by KNAPP  MD-J, JON ([id number]) on  06/07/2016 1:20:39 PM      MDM   Final diagnoses:  Gastroesophageal reflux disease without esophagitis    Labs unremarkable. Patient improved with GI cocktail. Suspect GERD. We will put the patient on protonic Zofran he follow-up with [facility]    Bethann Berkshire, MD 06/07/16 1600

## 2016-11-12 ENCOUNTER — Emergency Department (HOSPITAL_COMMUNITY): Payer: Self-pay

## 2016-11-12 ENCOUNTER — Encounter (HOSPITAL_COMMUNITY): Payer: Self-pay

## 2016-11-12 ENCOUNTER — Emergency Department (HOSPITAL_COMMUNITY)
Admission: EM | Admit: 2016-11-12 | Discharge: 2016-11-12 | Disposition: A | Discharge status: Home / Self Care | Payer: Self-pay | Attending: Emergency Medicine | Admitting: Emergency Medicine

## 2016-11-12 DIAGNOSIS — R05 Cough: Secondary | ICD-10-CM

## 2016-11-12 DIAGNOSIS — M546 Pain in thoracic spine: Secondary | ICD-10-CM | POA: Insufficient documentation

## 2016-11-12 DIAGNOSIS — R079 Chest pain, unspecified: Secondary | ICD-10-CM

## 2016-11-12 DIAGNOSIS — Z91010 Allergy to peanuts: Secondary | ICD-10-CM | POA: Insufficient documentation

## 2016-11-12 DIAGNOSIS — R0789 Other chest pain: Secondary | ICD-10-CM | POA: Insufficient documentation

## 2016-11-12 DIAGNOSIS — R058 Other specified cough: Secondary | ICD-10-CM

## 2016-11-12 LAB — HEPATIC FUNCTION PANEL
ALBUMIN: 4.2 g/dL (ref 3.5–5.0)
ALT: 60 U/L (ref 17–63)
AST: 49 U/L — ABNORMAL HIGH (ref 15–41)
Alkaline Phosphatase: 72 U/L (ref 38–126)
Bilirubin, Direct: <0.1 mg/dL — ABNORMAL LOW (ref 0.1–0.5)
TOTAL PROTEIN: 8.1 g/dL (ref 6.5–8.1)
Total Bilirubin: 1 mg/dL (ref 0.3–1.2)

## 2016-11-12 LAB — D-DIMER, QUANTITATIVE: D-Dimer, Quant: <0.27 ug/mL-FEU (ref 0.00–0.50)

## 2016-11-12 LAB — CBC
HCT: 46.3 % (ref 39.0–52.0)
Hemoglobin: 16.1 g/dL (ref 13.0–17.0)
MCH: 31.5 pg (ref 26.0–34.0)
MCHC: 34.8 g/dL (ref 30.0–36.0)
MCV: 90.6 fL (ref 78.0–100.0)
Platelets: 238 10*3/uL (ref 150–400)
RBC: 5.11 MIL/uL (ref 4.22–5.81)
RDW: 13.9 % (ref 11.5–15.5)
WBC: 8.5 10*3/uL (ref 4.0–10.5)

## 2016-11-12 LAB — BASIC METABOLIC PANEL
Anion gap: 7 (ref 5–15)
BUN: 19 mg/dL (ref 6–20)
CALCIUM: 9.3 mg/dL (ref 8.9–10.3)
CHLORIDE: 104 mmol/L (ref 101–111)
CO2: 25 mmol/L (ref 22–32)
Creatinine, Ser: 1.1 mg/dL (ref 0.61–1.24)
GFR calc non Af Amer: >60 mL/min (ref >60)
Glucose, Bld: 100 mg/dL — ABNORMAL HIGH (ref 65–99)
Potassium: 3.8 mmol/L (ref 3.5–5.1)
SODIUM: 136 mmol/L (ref 135–145)

## 2016-11-12 LAB — I-STAT TROPONIN, ED: Troponin i, poc: 0.01 ng/mL (ref 0.00–0.08)

## 2016-11-12 LAB — LIPASE, BLOOD: LIPASE: 22 U/L (ref 11–51)

## 2016-11-12 MED ORDER — HYDROCODONE-ACETAMINOPHEN 5-325 MG PO TABS
1.0000 | ORAL_TABLET | Freq: Once | ORAL | Status: AC
  Administered 2016-11-12: 1 via ORAL
  Filled 2016-11-12: qty 1

## 2016-11-12 MED ORDER — HYDROCODONE-ACETAMINOPHEN 5-325 MG PO TABS: 1.0000 | ORAL_TABLET | Freq: Four times a day (QID) | ORAL | 0 refills | Status: DC | PRN

## 2016-11-12 MED ORDER — GI COCKTAIL ~~LOC~~
30.0000 mL | Freq: Once | ORAL | Status: AC
  Administered 2016-11-12: 30 mL via ORAL
  Filled 2016-11-12: qty 30

## 2016-11-12 MED ORDER — BENZONATATE 100 MG PO CAPS: 100.0000 mg | ORAL_CAPSULE | Freq: Three times a day (TID) | ORAL | 0 refills | Status: DC | PRN

## 2016-11-12 MED ORDER — ACETAMINOPHEN 325 MG PO TABS
650.0000 mg | ORAL_TABLET | Freq: Once | ORAL | Status: AC
  Administered 2016-11-12: 650 mg via ORAL
  Filled 2016-11-12: qty 2

## 2016-11-12 MED ORDER — PANTOPRAZOLE SODIUM 20 MG PO TBEC
20.0000 mg | DELAYED_RELEASE_TABLET | Freq: Every day | ORAL | 1 refills | Status: DC
Start: 2016-11-12 — End: 2017-03-13

## 2016-11-12 MED ORDER — ALBUTEROL SULFATE (2.5 MG/3ML) 0.083% IN NEBU
5.0000 mg | INHALATION_SOLUTION | Freq: Once | RESPIRATORY_TRACT | Status: AC
  Administered 2016-11-12: 5 mg via RESPIRATORY_TRACT
  Filled 2016-11-12: qty 6

## 2016-11-12 NOTE — ED Notes (Signed)
Patient requesting a prescription for his protonix.  Will notify MD.

## 2016-11-12 NOTE — ED Provider Notes (Signed)
[facility]-EMERG[facility]Y DEPT Provider Note   CSN: [id number] Arrival date & time: 11/12/16  1214     History   Chief Complaint Chief Complaint  Patient presents with  . Shortness of Breath  . Cough  . Back Pain    HPI Caleb Rivers is a 60 y.o. male.  HPI  60 year old male brought in for cough and chest pain. History is taken with the assistance of the niece because the patient speaks [place]. Has had intermittent chest and back pain for about 3 weeks. Seems to be worsening. Has also been having a cough with brown sputum for these 3 weeks. No fevers. Has had less and less appetite as well. Denies abdominal pain. Does not feel like prior GERD. No known history of lung disease or smoking history. No vomiting. Was given albuterol prior to me seeing the patient but he states this has not helped at all.  Past Medical History:  Diagnosis Date  . Constipation   . Ulcer [facility](HCC)     Patient Active Problem List   Diagnosis Date Noted  . SBO (small bowel obstruction) 08/30/2014  . Abdominal pain, generalized 08/30/2014  . Peptic ulcer disease 08/30/2014  . Nausea and vomiting 08/30/2014  . Abnormal resting ECG findings - repolarization changes; NOT STEMI 09/26/2013  . Gastritis 09/25/2013    Past Surgical History:  Procedure Laterality Date  . LEFT HEART CATHETERIZATION WITH CORONARY ANGIOGRAM N/A 09/25/2013   Procedure: LEFT HEART CATHETERIZATION WITH CORONARY ANGIOGRAM;  Surgeon: Lennette Biharihomas A Kelly, MD;  [facility];  Service: Cardiovascular;  Laterality: N/A;       Home Medications    Prior to Admission medications   Medication Sig Start Date End Date Taking? Authorizing Provider  acetaminophen (TYLENOL) 500 MG tablet Take 1,000 mg by mouth every 6 (six) hours as needed (pain).   Yes Historical Provider, MD  Multiple Vitamin (MULTIVITAMIN WITH MINERALS) TABS tablet Take 1 tablet by mouth daily.   Yes Historical Provider, MD  benzonatate (TESSALON) 100 MG capsule Take 1  capsule (100 mg total) by mouth 3 (three) times daily as needed for cough. 11/12/16   Pricilla LovelessScott Raye Wiens, MD  HYDROcodone-acetaminophen (NORCO) 5-325 MG tablet Take 1 tablet by mouth every 6 (six) hours as needed. 11/12/16   Pricilla LovelessScott Lincon Sahlin, MD  ondansetron (ZOFRAN ODT) 4 MG disintegrating tablet 4mg  ODT q4 hours prn nausea/vomit Patient not taking: Reported on 11/12/2016 06/07/16   Bethann BerkshireJoseph Zammit, MD  pantoprazole (PROTONIX) 20 MG tablet Take 1 tablet (20 mg total) by mouth daily. 11/12/16   Pricilla LovelessScott Elois Averitt, MD  traMADol (ULTRAM) 50 MG tablet Take 1 tablet (50 mg total) by mouth every 6 (six) hours as needed for moderate pain. Patient not taking: Reported on 11/12/2016 09/04/14   Alison MurrayAlma M Devine, MD    Family History History reviewed. No pertinent family history.  Social History Social History  Substance Use Topics  . Smoking status: Never Smoker  . Smokeless tobacco: Never Used  . Alcohol use No     Allergies   Asa [aspirin]; Nsaids; and Peanut-containing drug products   Review of Systems Review of Systems  Constitutional: Negative for fever.  Respiratory: Positive for cough. Negative for shortness of breath.   Cardiovascular: Positive for chest pain.  Gastrointestinal: Negative for abdominal pain and vomiting.  Genitourinary: Negative for dysuria.  Musculoskeletal: Positive for back pain.  Neurological: Negative for weakness and numbness.  All other systems reviewed and are negative.    Physical Exam Updated Vital Signs BP 140/98 (  BP Location: Right Arm)   Pulse 69   Temp 97.8 F (36.6 C)   Resp 17   Ht 6\' 2"  (1.88 m)   SpO2 100%   Physical Exam  Constitutional: He is oriented to person, place, and time. He appears well-developed and well-nourished. No distress.  HENT:  Head: Normocephalic and atraumatic.  Right Ear: External ear normal.  Left Ear: External ear normal.  Nose: Nose normal.  Eyes: Right eye exhibits no discharge. Left eye exhibits no discharge.  Neck:  Neck supple.  Cardiovascular: Normal rate, regular rhythm and normal heart sounds.   Pulmonary/Chest: Effort normal and breath sounds normal. He has no wheezes. He has no rales. He exhibits no tenderness.  Abdominal: Soft. He exhibits no distension. There is no tenderness.  Musculoskeletal: He exhibits no edema.  No reproducible back tenderness. No CVA tenderness  Neurological: He is alert and oriented to person, place, and time.  5/5 strength in BLE. Normal gross sensation  Skin: Skin is warm and dry. He is not diaphoretic.  Nursing note and vitals reviewed.    ED Treatments / Results  Labs (all labs ordered are listed, but only abnormal results are displayed) Labs Reviewed  BASIC METABOLIC PANEL - Abnormal; Notable for the following:       Result Value   Glucose, Bld 100 (*)    All other components within normal limits  HEPATIC FUNCTION PANEL - Abnormal; Notable for the following:    AST 49 (*)    Bilirubin, Direct <0.1 (*)    All other components within normal limits  CBC  LIPASE, BLOOD  D-DIMER, QUANTITATIVE (NOT AT [facility])  Rosezena Sensor, ED    EKG  EKG Interpretation  Date/Time:  Wednesday November 12 2016 12:20:33 EST Ventricular Rate:  84 PR Interval:    QRS Duration: 82 QT Interval:  356 QTC Calculation: 421 R Axis:   -17 Text Interpretation:  Sinus rhythm Borderline left axis deviation Abnormal R-wave progression, early transition ST elevation, similar to June 2017 Confirmed by Criss Alvine MD, Waylynn Benefiel [phone]) on 11/12/2016 12:38:07 PM Also confirmed by Criss Alvine MD, Maricsa Sammons [phone]), editor Stout CT, Jola Babinski [phone])  on 11/12/2016 2:39:11 PM       Radiology Dg Chest 2 View  Result Date: 11/12/2016 CLINICAL DATA:  Shortness of breath, chest pain. EXAM: CHEST  2 VIEW COMPARISON:  Radiographs of March 13, 2012. FINDINGS: The heart size and mediastinal contours are within normal limits. Both lungs are clear. No pneumothorax or pleural effusion is noted. The visualized  skeletal structures are unremarkable. IMPRESSION: No active cardiopulmonary disease. Electronically Signed   By: Lupita Raider, M.D.   On: 11/12/2016 13:26   Dg Thoracic Spine W/swimmers  Result Date: 11/12/2016 CLINICAL DATA:  60 year old male with history of thoracic spine pain today. EXAM: THORACIC SPINE - 3 VIEWS COMPARISON:  Lateral chest x-ray 11/12/2016. FINDINGS: There is no evidence of thoracic spine fracture. Alignment is near normal with minimal dextroscoliosis throughout the mid to lower thoracic spine. No other significant bone abnormalities are identified. IMPRESSION: Negative. Electronically Signed   By: Trudie Reed M.D.   On: 11/12/2016 16:19   US Abdomen Limited  Result Date: 11/12/2016 CLINICAL DATA:  Right upper quadrant abdominal pain for several weeks. EXAM: US ABDOMEN LIMITED - RIGHT UPPER QUADRANT COMPARISON:  None. FINDINGS: Gallbladder: No gallstones or wall thickening visualized. No sonographic Murphy sign noted by sonographer. Common bile duct: Diameter: 2.9 mm which is within normal limits. Liver: No focal lesion identified. Within  normal limits in parenchymal echogenicity. IMPRESSION: No abnormality seen in the right upper quadrant of the abdomen. Electronically Signed   By: Lupita RaiderJames  Green Jr, M.D.   On: 11/12/2016 15:01    Procedures Procedures (including critical care time)  Medications Ordered in ED Medications  albuterol (PROVENTIL) (2.5 MG/3ML) 0.083% nebulizer solution 5 mg (5 mg Nebulization Given 11/12/16 1252)  gi cocktail (Maalox,Lidocaine,Donnatal) (30 mLs Oral Given 11/12/16 1404)  acetaminophen (TYLENOL) tablet 650 mg (650 mg Oral Given 11/12/16 1404)  HYDROcodone-acetaminophen (NORCO/VICODIN) 5-325 MG per tablet 1 tablet (1 tablet Oral Given 11/12/16 1541)     Initial Impression / Assessment and Plan / ED Course  I have reviewed the triage vital signs and the nursing notes.  Pertinent labs & imaging results that were available during my care  of the patient were reviewed by me and considered in my medical decision making (see chart for details).  Clinical Course as of Nov 12 1714  Wed Nov 12, 2016  1337 Overall patient appears well. Possibly GI? Will also eval gallbladder given back pain. My suspicion for ACS, dissection is low. Low risk for PE, will get ddimer given age  [SG]    Clinical Course User Index [SG] Pricilla LovelessScott Mixtli Reno, MD    No obvious source for the patient's symptoms. He does not appear to have pneumonia or other infection. D-dimer is negative and a low risk patient for PE. Highly doubt ACS, especially with his 3 weeks of symptoms. Highly doubt AAA or dissection. Neuro exam benign. He is unable to take NSAIDs because of his ulcer history. Short course of Norco, Tessalon, and refill Protonix at his request. No emergent findings evident on this visit. Follow-up with a PCP. Discussed return precautions.  Final Clinical Impressions(s) / ED Diagnoses   Final diagnoses:  Thoracic back pain  Acute midline thoracic back pain  Productive cough  Nonspecific chest pain    New Prescriptions Discharge Medication List as of 11/12/2016  4:35 PM    START taking these medications   Details  benzonatate (TESSALON) 100 MG capsule Take 1 capsule (100 mg total) by mouth 3 (three) times daily as needed for cough., Starting Wed 11/12/2016, Print    HYDROcodone-acetaminophen (NORCO) 5-325 MG tablet Take 1 tablet by mouth every 6 (six) hours as needed., Starting Wed 11/12/2016, Print         Pricilla LovelessScott Jowanna Loeffler, MD 11/12/16 [phone]

## 2016-11-12 NOTE — ED Notes (Signed)
Delay on vital signs patient is currently having an ultra sound done

## 2016-11-12 NOTE — ED Notes (Signed)
Patient d/c'd self care with family member.  F/U and medications reviewed Patient verbalized understanding. 

## 2016-11-12 NOTE — ED Triage Notes (Signed)
Pt primary language [place] and [place]. Pt presents w/ c/o SOB, CP, loss of appetite, productive-cough, back pain x3 weeks. Pt is A+OX4, speaking in complete sentences, ambulatory to triage.

## 2017-03-13 ENCOUNTER — Encounter (HOSPITAL_COMMUNITY): Payer: Self-pay

## 2017-03-13 ENCOUNTER — Emergency Department (HOSPITAL_COMMUNITY): Payer: PRIVATE HEALTH INSURANCE

## 2017-03-13 ENCOUNTER — Emergency Department (HOSPITAL_COMMUNITY)
Admission: EM | Admit: 2017-03-13 | Discharge: 2017-03-13 | Disposition: A | Discharge status: Home / Self Care | Payer: PRIVATE HEALTH INSURANCE | Attending: Emergency Medicine | Admitting: Emergency Medicine

## 2017-03-13 DIAGNOSIS — Z79899 Other long term (current) drug therapy: Secondary | ICD-10-CM | POA: Diagnosis not present

## 2017-03-13 DIAGNOSIS — R05 Cough: Secondary | ICD-10-CM | POA: Diagnosis present

## 2017-03-13 DIAGNOSIS — R059 Cough, unspecified: Secondary | ICD-10-CM

## 2017-03-13 DIAGNOSIS — Z955 Presence of coronary angioplasty implant and graft: Secondary | ICD-10-CM | POA: Insufficient documentation

## 2017-03-13 DIAGNOSIS — K047 Periapical abscess without sinus: Secondary | ICD-10-CM | POA: Insufficient documentation

## 2017-03-13 DIAGNOSIS — Z91010 Allergy to peanuts: Secondary | ICD-10-CM | POA: Insufficient documentation

## 2017-03-13 MED ORDER — BENZONATATE 100 MG PO CAPS: 100.0000 mg | ORAL_CAPSULE | Freq: Three times a day (TID) | ORAL | 0 refills | Status: DC | PRN

## 2017-03-13 MED ORDER — HYDROCODONE-HOMATROPINE 5-1.5 MG/5ML PO SYRP: 5.0000 mL | ORAL_SOLUTION | Freq: Four times a day (QID) | ORAL | 0 refills | Status: DC | PRN

## 2017-03-13 MED ORDER — PANTOPRAZOLE SODIUM 20 MG PO TBEC: 40.0000 mg | DELAYED_RELEASE_TABLET | Freq: Every day | ORAL | 1 refills | Status: DC

## 2017-03-13 MED ORDER — AMOXICILLIN-POT CLAVULANATE 875-125 MG PO TABS: 1.0000 | ORAL_TABLET | Freq: Two times a day (BID) | ORAL | 0 refills | Status: DC

## 2017-03-13 NOTE — ED Notes (Signed)
Bed: WTR6 Expected date:  Expected time:  Means of arrival:  Comments: 

## 2017-03-13 NOTE — ED Triage Notes (Signed)
Pt states cough x 1 week.  Has noticed blood tinge in sputum.  No fevers.

## 2017-03-13 NOTE — ED Provider Notes (Signed)
[facility]-EMERG[facility]Y DEPT Provider Note   CSN: [id number] Arrival date & time: 03/13/17  1404  By signing my name below, I, Sonum Patel, attest that this documentation has been prepared under the direction and in the presence of Sharen Heck, PA-C. Electronically Signed: Leone Payor, Scribe. 03/13/17. 4:06 PM.  History   Chief Complaint Chief Complaint  Patient presents with  . Hemoptysis  . Cough    The history is provided by the patient. A language interpreter was used.   HPI Comments: Caleb Rivers is a 61 y.o. male who presents to the Emergency Department complaining of a dry cough x 3 weeks following diagnosis of influenza.  He also reports small amounts of blood-tinged spit for the past 3 days. Blood-tinged spit occurs upon waking up/getting up after sleeping and upon brushing his teeth, "lots of blood when I brush".  Patient denies productive bloody cough or hemoptysis.  He does not cough up blood and clarifies he notices blood mixed with saliva when he wakes up from sleeping only and not after coughing. No associated fever, rhinorrhea, congestion, sore throat, CP, SOB, palpitations, vomiting, abdominal pain, blood in stool, melena, mouth pain, dental pain, unintentional weight loss, night sweats, epistaxis or bleeding from anywhere else.  No anticoagulants on board. No known clotting or bleeding disorders. No known liver disease.  No recent travel. He denies smoking or drinking alcohol. He reports history of gastric ulcer but states he does not have ulcer related concerns at this time. He denies heavy NSAID use. Patient does not take any medications, he takes ginger/water daily.   Past Medical History:  Diagnosis Date  . Constipation   . Ulcer [facility])     Patient Active Problem List   Diagnosis Date Noted  . SBO (small bowel obstruction) 08/30/2014  . Abdominal pain, generalized 08/30/2014  . Peptic ulcer disease 08/30/2014  . Nausea and vomiting 08/30/2014  . Abnormal resting  ECG findings - repolarization changes; NOT STEMI 09/26/2013  . Gastritis 09/25/2013    Past Surgical History:  Procedure Laterality Date  . LEFT HEART CATHETERIZATION WITH CORONARY ANGIOGRAM N/A 09/25/2013   Procedure: LEFT HEART CATHETERIZATION WITH CORONARY ANGIOGRAM;  Surgeon: Lennette Bihari, MD;  Location: [facility] CATH LAB;  Service: Cardiovascular;  Laterality: N/A;       Home Medications    Prior to Admission medications   Medication Sig Start Date End Date Taking? Authorizing Provider  acetaminophen (TYLENOL) 500 MG tablet Take 1,000 mg by mouth every 6 (six) hours as needed (pain).    Historical Provider, MD  amoxicillin-clavulanate (AUGMENTIN) 875-125 MG tablet Take 1 tablet by mouth every 12 (twelve) hours. 03/13/17   Liberty Handy, PA-C  benzonatate (TESSALON PERLES) 100 MG capsule Take 1 capsule (100 mg total) by mouth 3 (three) times daily as needed for cough. FOR DISRUPTIVE DAY TIME COUGH 03/13/17   Liberty Handy, PA-C  HYDROcodone-acetaminophen (NORCO) 5-325 MG tablet Take 1 tablet by mouth every 6 (six) hours as needed. 11/12/16   Pricilla Loveless, MD  HYDROcodone-homatropine [facility]) 5-1.5 MG/5ML syrup Take 5 mLs by mouth every 6 (six) hours as needed for cough. FOR DISRUPTIVE NIGHT TIME COUGH AND BODY ACHES 03/13/17   Liberty Handy, PA-C  Multiple Vitamin (MULTIVITAMIN WITH MINERALS) TABS tablet Take 1 tablet by mouth daily.    Historical Provider, MD  ondansetron (ZOFRAN ODT) 4 MG disintegrating tablet 4mg  ODT q4 hours prn nausea/vomit Patient not taking: Reported on 11/12/2016 06/07/16   Bethann Berkshire, MD  pantoprazole (PROTONIX)  20 MG tablet Take 2 tablets (40 mg total) by mouth daily. 03/13/17   Liberty Handy, PA-C  traMADol (ULTRAM) 50 MG tablet Take 1 tablet (50 mg total) by mouth every 6 (six) hours as needed for moderate pain. Patient not taking: Reported on 11/12/2016 09/04/14   Alison Murray, MD    Family History History reviewed. No pertinent family  history.  Social History Social History  Substance Use Topics  . Smoking status: Never Smoker  . Smokeless tobacco: Never Used  . Alcohol use No     Allergies   Asa [aspirin]; Nsaids; and Peanut-containing drug products   Review of Systems Review of Systems  Constitutional: Negative for chills, fatigue, fever and unexpected weight change.  HENT: Positive for dental problem. Negative for congestion, nosebleeds, rhinorrhea, sore throat and voice change.   Respiratory: Positive for cough. Negative for shortness of breath.   Cardiovascular: Negative for chest pain and palpitations.  Gastrointestinal: Negative for abdominal pain, anal bleeding, blood in stool, constipation, diarrhea, nausea and vomiting.  Genitourinary: Negative for hematuria.  Musculoskeletal: Negative for myalgias.  Allergic/Immunologic: Negative for immunocompromised state.  Neurological: Negative for dizziness, weakness and headaches.  Hematological: Does not bruise/bleed easily.     Physical Exam Updated Vital Signs BP 125/90 (BP Location: Left Arm)   Pulse 68   Temp 98.1 F (36.7 C) (Oral)   Resp 14   SpO2 99%   Physical Exam  Constitutional: He is oriented to person, place, and time. He appears well-developed and well-nourished. No distress.  HENT:  Head: Normocephalic and atraumatic.  Right Ear: External ear normal.  Left Ear: External ear normal.  Nose: Nose normal.  Mouth/Throat: Oropharynx is clear and moist and mucous membranes are normal. No trismus in the jaw. Abnormal dentition. Dental abscesses and dental caries present. No oropharyngeal exudate.    Poor dentition.  There is purulent discharge and bleeding at gingival margins of teeth #25 and #24, easily expressed without reported tenderness.  No significant edema, induration, or focal area suggestive of abscess. No facial or anterior neck edema, erythema or erythema. No sublingual edema or tenderness.  Soft palate flat without  tenderness.  No trismus.   No pooling of oral secretions.  Phonation normal, no hot potato voice.  Maxilla and mandible nontender. Mastoids without edema, erythema or tenderness.    Eyes: Conjunctivae and EOM are normal. Pupils are equal, round, and reactive to light. No scleral icterus.  Neck: Normal range of motion. Neck supple. No JVD present. No tracheal deviation present.  Cardiovascular: Normal rate, regular rhythm, normal heart sounds and intact distal pulses.   No murmur heard. Pulmonary/Chest: Effort normal and breath sounds normal. He has no wheezes.  RR within normal limits. SpO2 within normal limits.  Normal breathing effort. Patient speaking in full sentences. No pursed lip breathing. No chest wall retractions. No cyanosis. Chest wall expansion symmetric.  No chest wall tenderness. Lungs CTAB anteriorly and posteriorly without wheezing, rhonchi or crackles.  No egophony.  No JVD. No orthopnea with head of bed flat.   Abdominal: Soft. He exhibits no distension. There is no tenderness.  Musculoskeletal: Normal range of motion. He exhibits no deformity.  Lymphadenopathy:    He has no cervical adenopathy.  Neurological: He is alert and oriented to person, place, and time.  Skin: Skin is warm and dry. Capillary refill takes less than 2 seconds.  Psychiatric: He has a normal mood and affect. His behavior is normal. Judgment and thought content normal.  Nursing note and vitals reviewed.    ED Treatments / Results  DIAGNOSTIC STUDIES: Oxygen Saturation is 98% on RA, normal by my interpretation.    COORDINATION OF CARE: 3:41 PM Discussed treatment plan with pt at bedside and pt agreed to plan.   Labs (all labs ordered are listed, but only abnormal results are displayed) Labs Reviewed - No data to display  EKG  EKG Interpretation None       Radiology Dg Chest 2 View  Result Date: 03/13/2017 CLINICAL DATA:  Cough. EXAM: CHEST  2 VIEW COMPARISON:  11/12/2016.  FINDINGS: Mediastinum hilar structures normal. Lungs are clear. Heart size normal. No pleural effusion or pneumothorax. Mild thoracic spine scoliosis and degenerative change. IMPRESSION: No acute cardiopulmonary disease. Electronically Signed   By: Maisie Fushomas  Register   On: 03/13/2017 15:02    Procedures Procedures (including critical care time)  Medications Ordered in ED Medications - No data to display   Initial Impression / Assessment and Plan / ED Course  I have reviewed the triage vital signs and the nursing notes.  Pertinent labs & imaging results that were available during my care of the patient were reviewed by me and considered in my medical decision making (see chart for details).  Clinical Course as of Mar 14 1204  Fri Mar 13, 2017  1529 FINDINGS: Mediastinum hilar structures normal. Lungs are clear. Heart size normal. No pleural effusion or pneumothorax. Mild thoracic spine scoliosis and degenerative change.  IMPRESSION: No acute cardiopulmonary disease. DG Chest 2 View [CG]  1529 Temp: 98.1 F (36.7 C) [CG]  1529 Pulse Rate: 85 [CG]  1529 Resp: 18 [CG]  1529 SpO2: 98 % [CG]    Clinical Course User Index [CG] Liberty Handylaudia J Deseree Zemaitis, PA-C   Thorough history obtained with interpreter, twice for HPI and discharge instructions.    Patient not having hemoptysis or productive cough.  Cough most likely post-viral cough given preceding diagnosis of influenza.  VS within normal limites, clear lungs bilaterally, negative CXR.  Doubt PNA, TB, PE at this time as history is not consistent or suggestive of these.  On exam there were two dental abscess with active bleeding and purulent discharge.  I suspect this is the source of bleeding upon waking up and brushing teeth.  Purulence was expressed in ED, no surrounding facial or neck edema suggestive of deeper extension of abscess.  Patient will be discharged with abx for oral abscess and tessalon and hycodan for disruptive cough.  Patient  does not have a PCP or dentist.  I opted for strong abx as there is chance he may not follow up with dentist.  I strongly encouraged patient to call dentist ASAP and call [facility] to establish care for routine medical care.  He has not been compliant with protonix as he ran out of his rx.  Rx for protonix renewed today.  Patient aware of red flag symptoms to monitor for worsening dental abscess and cough.  Strict ED return precautions, patient repeated back discharge instructions to me and appears to have good understanding.    Patient, ED treatment and discharge plan was discussed with supervising physician who is agreeable with plan.   Final Clinical Impressions(s) / ED Diagnoses   Final diagnoses:  Cough  Dental abscess    New Prescriptions Discharge Medication List as of 03/13/2017  5:03 PM    START taking these medications   Details  amoxicillin-clavulanate (AUGMENTIN) 875-125 MG tablet Take 1 tablet by mouth every  12 (twelve) hours., Starting Fri 03/13/2017, Print    HYDROcodone-homatropine (HYCODAN) 5-1.5 MG/5ML syrup Take 5 mLs by mouth every 6 (six) hours as needed for cough. FOR DISRUPTIVE NIGHT TIME COUGH AND BODY ACHES, Starting Fri 03/13/2017, Print       I personally performed the services described in this documentation, which was scribed in my presence. The recorded information has been reviewed and is accurate.    Liberty Handy, PA-C 03/14/17 1205    Melene Plan, DO 03/14/17 [phone]

## 2017-03-13 NOTE — Discharge Instructions (Signed)
Please take hycodan for night time (bed time) cough, and tessalon perles for day time cough.  Sleeping with a humidifier may help decrease your cough.  Your cough is most likely a post-viral cough, especially because you had the flue before your cough started.  This cough should resolved in 6-8 weeks.  Return to the emergency department if you notice blood in your cough, fevers, shortness of breath or any other symptoms.  You have a dental abscess.  Please take antibiotics for this and CALL DENTIST AS SOON AS POSSIBLE TO BE EVALUATED.  You may need an extraction.  It is dangerous to have a dental abscess without treatment as the infection can quickly worsen.  Return if you have fevers, swelling, bleeding or have difficulty open/closing your jaw.   Please call [facility] to establish care with a primary care provider for regular routine medical care.    Your prescription for protonix has been renewed,please take this medication for your ulcers.

## 2017-04-25 ENCOUNTER — Emergency Department (HOSPITAL_COMMUNITY)
Admission: EM | Admit: 2017-04-25 | Discharge: 2017-04-25 | Disposition: A | Discharge status: Home / Self Care | Payer: No Typology Code available for payment source | Attending: Emergency Medicine | Admitting: Emergency Medicine

## 2017-04-25 ENCOUNTER — Encounter (HOSPITAL_COMMUNITY): Payer: Self-pay | Admitting: Emergency Medicine

## 2017-04-25 ENCOUNTER — Emergency Department (HOSPITAL_COMMUNITY): Payer: No Typology Code available for payment source

## 2017-04-25 DIAGNOSIS — Z79899 Other long term (current) drug therapy: Secondary | ICD-10-CM | POA: Insufficient documentation

## 2017-04-25 DIAGNOSIS — K2970 Gastritis, unspecified, without bleeding: Secondary | ICD-10-CM | POA: Diagnosis not present

## 2017-04-25 DIAGNOSIS — Z91010 Allergy to peanuts: Secondary | ICD-10-CM | POA: Diagnosis not present

## 2017-04-25 DIAGNOSIS — R1010 Upper abdominal pain, unspecified: Secondary | ICD-10-CM | POA: Diagnosis present

## 2017-04-25 LAB — URINALYSIS, ROUTINE W REFLEX MICROSCOPIC
BILIRUBIN URINE: NEGATIVE
Bacteria, UA: NONE SEEN
GLUCOSE, UA: NEGATIVE mg/dL
KETONES UR: NEGATIVE mg/dL
Leukocytes, UA: NEGATIVE
Nitrite: NEGATIVE
PROTEIN: NEGATIVE mg/dL
SQUAMOUS EPITHELIAL / LPF: NONE SEEN
Specific Gravity, Urine: 1.024 (ref 1.005–1.030)
WBC UA: NONE SEEN WBC/hpf (ref 0–5)
pH: 5 (ref 5.0–8.0)

## 2017-04-25 LAB — CBC
HCT: 46.8 % (ref 39.0–52.0)
HEMOGLOBIN: 15.9 g/dL (ref 13.0–17.0)
MCH: 31.3 pg (ref 26.0–34.0)
MCHC: 34 g/dL (ref 30.0–36.0)
MCV: 92.1 fL (ref 78.0–100.0)
Platelets: 210 10*3/uL (ref 150–400)
RBC: 5.08 MIL/uL (ref 4.22–5.81)
RDW: 14.1 % (ref 11.5–15.5)
WBC: 7.7 10*3/uL (ref 4.0–10.5)

## 2017-04-25 LAB — COMPREHENSIVE METABOLIC PANEL
ALBUMIN: 3.9 g/dL (ref 3.5–5.0)
ALT: 42 U/L (ref 17–63)
ANION GAP: 7 (ref 5–15)
AST: 37 U/L (ref 15–41)
Alkaline Phosphatase: 85 U/L (ref 38–126)
BUN: 28 mg/dL — ABNORMAL HIGH (ref 6–20)
CHLORIDE: 105 mmol/L (ref 101–111)
CO2: 25 mmol/L (ref 22–32)
Calcium: 9.4 mg/dL (ref 8.9–10.3)
Creatinine, Ser: 1.15 mg/dL (ref 0.61–1.24)
GFR calc non Af Amer: >60 mL/min (ref >60)
Glucose, Bld: 99 mg/dL (ref 65–99)
Potassium: 4.1 mmol/L (ref 3.5–5.1)
SODIUM: 137 mmol/L (ref 135–145)
Total Bilirubin: 0.4 mg/dL (ref 0.3–1.2)
Total Protein: 7.8 g/dL (ref 6.5–8.1)

## 2017-04-25 LAB — LIPASE, BLOOD: Lipase: 35 U/L (ref 11–51)

## 2017-04-25 MED ORDER — PANTOPRAZOLE SODIUM 20 MG PO TBEC: 20.0000 mg | DELAYED_RELEASE_TABLET | Freq: Every day | ORAL | 0 refills | Status: DC

## 2017-04-25 MED ORDER — GI COCKTAIL ~~LOC~~
30.0000 mL | Freq: Once | ORAL | Status: AC
  Administered 2017-04-25: 30 mL via ORAL
  Filled 2017-04-25: qty 30

## 2017-04-25 NOTE — Discharge Instructions (Signed)
Return to the ED with any concerns including vomiting and not able to keep down liquids or your medications, abdominal pain especially if it localizes to the right lower abdomen, fever or chills, and decreased urine output, decreased level of alertness or lethargy, or any other alarming symptoms.  °

## 2017-04-25 NOTE — ED Provider Notes (Signed)
[facility]-EMERGENCY DEPT Provider Note   CSN: [id number] Arrival date & time: 04/25/17  2002     History   Chief Complaint Chief Complaint  Patient presents with  . Constipation  . Abdominal Pain    HPI Caleb Rivers is a 61 y.o. male.  HPI  Pt presenting with c/o mid and upper abdominal pain.  Pain is similar to his prior pain associated with ulcers.  He also tastes acid in the back of his mouth at time.  Pain is worse after eating spicy foods.  No chest pain, no shortness of breath.  No vomiting or changes in stools.  He has not been on any medications for his ulcer or seen a doctor in years about this.  No blood in stool or melena.  There are no other associated systemic symptoms, there are no other alleviating or modifying factors. He also mentions concern about constipation- last BM was 2 days ago although he states if he drinks milk the constipation is always resolved.  There are no other associated systemic symptoms, there are no other alleviating or modifying factors.   Past Medical History:  Diagnosis Date  . Constipation   . Ulcer     Patient Active Problem List   Diagnosis Date Noted  . SBO (small bowel obstruction) (HCC) 08/30/2014  . Abdominal pain, generalized 08/30/2014  . Peptic ulcer disease 08/30/2014  . Nausea and vomiting 08/30/2014  . Abnormal resting ECG findings - repolarization changes; NOT STEMI 09/26/2013  . Gastritis 09/25/2013    Past Surgical History:  Procedure Laterality Date  . LEFT HEART CATHETERIZATION WITH CORONARY ANGIOGRAM N/A 09/25/2013   Procedure: LEFT HEART CATHETERIZATION WITH CORONARY ANGIOGRAM;  Surgeon: Lennette Bihari, MD;  Location: [facility] CATH LAB;  Service: Cardiovascular;  Laterality: N/A;       Home Medications    Prior to Admission medications   Medication Sig Start Date End Date Taking? Authorizing Provider  acetaminophen (TYLENOL) 500 MG tablet Take 1,000 mg by mouth every 6 (six) hours as needed (pain).   Yes Historical  Provider, MD  Multiple Vitamin (MULTIVITAMIN WITH MINERALS) TABS tablet Take 1 tablet by mouth daily.   Yes Historical Provider, MD  amoxicillin-clavulanate (AUGMENTIN) 875-125 MG tablet Take 1 tablet by mouth every 12 (twelve) hours. Patient not taking: Reported on 04/25/2017 03/13/17   Liberty Handy, PA-C  benzonatate (TESSALON PERLES) 100 MG capsule Take 1 capsule (100 mg total) by mouth 3 (three) times daily as needed for cough. FOR DISRUPTIVE DAY TIME COUGH Patient not taking: Reported on 04/25/2017 03/13/17   Liberty Handy, PA-C  HYDROcodone-acetaminophen (NORCO) 5-325 MG tablet Take 1 tablet by mouth every 6 (six) hours as needed. Patient not taking: Reported on 04/25/2017 11/12/16   Pricilla Loveless, MD  HYDROcodone-homatropine [facility]) 5-1.5 MG/5ML syrup Take 5 mLs by mouth every 6 (six) hours as needed for cough. FOR DISRUPTIVE NIGHT TIME COUGH AND BODY ACHES Patient not taking: Reported on 04/25/2017 03/13/17   Liberty Handy, PA-C  ondansetron (ZOFRAN ODT) 4 MG disintegrating tablet  ODT q4 hours prn nausea/vomit Patient not taking: Reported on 11/12/2016 06/07/16   Bethann Berkshire, MD  pantoprazole (PROTONIX) 20 MG tablet Take 1 tablet (20 mg total) by mouth daily. 04/25/17   Jerelyn Scott, MD    Family History History reviewed. No pertinent family history.  Social History Social History  Substance Use Topics  . Smoking status: Never Smoker  . Smokeless tobacco: Never Used  . Alcohol use No  Allergies   Asa [aspirin]; Nsaids; and Peanut-containing drug products   Review of Systems Review of Systems  ROS reviewed and all otherwise negative except for mentioned in HPI   Physical Exam Updated Vital Signs BP 137/89 (BP Location: Left Arm)   Pulse 65   Temp 97.8 F (36.6 C) (Oral)   Resp 18   Ht  (1.93 m)   Wt 218 lb 11.1 oz (99.2 kg)   SpO2 98%   BMI 26.62 kg/m  Vitals reviewed Physical Exam Physical Examination: General appearance - alert, well  appearing, and in no distress Mental status - alert, oriented to person, place, and time Eyes - no conjunctival injection, no scleral icterus Mouth - mucous membranes moist, pharynx normal without lesions Chest - clear to auscultation, no wheezes, rales or rhonchi, symmetric air entry Heart - normal rate, regular rhythm, normal S1, S2, no murmurs, rubs, clicks or gallops Abdomen - soft, mild ttp in epigastric region, no gaurding or rebound tenderness, nondistended, no masses or organomegaly Neurological - alert, oriented, normal speech Extremities - peripheral pulses normal, no pedal edema, no clubbing or cyanosis Skin - normal coloration and turgor, no rashes  ED Treatments / Results  Labs (all labs ordered are listed, but only abnormal results are displayed) Labs Reviewed  COMPREHENSIVE METABOLIC PANEL - Abnormal; Notable for the following:       Result Value   BUN 28 (*)    All other components within normal limits  URINALYSIS, ROUTINE W REFLEX MICROSCOPIC - Abnormal; Notable for the following:    Hgb urine dipstick SMALL (*)    All other components within normal limits  LIPASE, BLOOD  CBC    EKG  EKG Interpretation None       Radiology Dg Abdomen 1 View  Result Date: 04/25/2017 CLINICAL DATA:  Upper to mid abdominal pain as well of constipation for 3 days. History of a stomach ulcer. EXAM: ABDOMEN - 1 VIEW COMPARISON:  09/07/2014 FINDINGS: Normal bowel gas pattern. No evidence of renal or ureteral stones. The soft tissues are unremarkable. The skeletal structures are unremarkable. IMPRESSION: Negative. Electronically Signed   By: Amie Portland M.D.   On: 04/25/2017 22:45    Procedures Procedures (including critical care time)  Medications Ordered in ED Medications  gi cocktail (Maalox,Lidocaine,Donnatal) (30 mLs Oral Given 04/25/17 2221)     Initial Impression / Assessment and Plan / ED Course  I have reviewed the triage vital signs and the nursing  notes.  Pertinent labs & imaging results that were available during my care of the patient were reviewed by me and considered in my medical decision making (see chart for details).     Pt presenting with symptoms that are most c/w GERD, gastritis versus ulcer. Labs are reassuring.  Doubt ACS.  Xray shows no constipation.  Pt advised to start PPI, given information for PMD and discussed importance about having PMD for chronic medical conditions.  Discharged with strict return precautions.  Pt agreeable with plan.  Final Clinical Impressions(s) / ED Diagnoses   Final diagnoses:  Gastritis without bleeding, unspecified chronicity, unspecified gastritis type    New Prescriptions Discharge Medication List as of 04/25/2017 11:17 PM       Jerelyn Scott, MD 04/27/17 [phone]

## 2017-04-25 NOTE — ED Triage Notes (Signed)
Pt family member reports pt having abd pain and constipation for the last 3 days.

## 2019-09-04 ENCOUNTER — Other Ambulatory Visit: Payer: Self-pay

## 2019-09-04 ENCOUNTER — Encounter (HOSPITAL_COMMUNITY): Payer: Self-pay

## 2019-09-04 ENCOUNTER — Inpatient Hospital Stay (HOSPITAL_COMMUNITY)
Admission: EM | Admit: 2019-09-04 | Discharge: 2019-09-07 | DRG: 390 | Disposition: A | Discharge status: Home / Self Care | Payer: No Typology Code available for payment source | Attending: Internal Medicine | Admitting: Internal Medicine

## 2019-09-04 DIAGNOSIS — R1032 Left lower quadrant pain: Secondary | ICD-10-CM | POA: Diagnosis not present

## 2019-09-04 DIAGNOSIS — Z09 Encounter for follow-up examination after completed treatment for conditions other than malignant neoplasm: Secondary | ICD-10-CM

## 2019-09-04 DIAGNOSIS — R1084 Generalized abdominal pain: Secondary | ICD-10-CM | POA: Diagnosis present

## 2019-09-04 DIAGNOSIS — Z8711 Personal history of peptic ulcer disease: Secondary | ICD-10-CM

## 2019-09-04 DIAGNOSIS — R9431 Abnormal electrocardiogram [ECG] [EKG]: Secondary | ICD-10-CM | POA: Diagnosis present

## 2019-09-04 DIAGNOSIS — Z20828 Contact with and (suspected) exposure to other viral communicable diseases: Secondary | ICD-10-CM | POA: Diagnosis present

## 2019-09-04 DIAGNOSIS — K56609 Unspecified intestinal obstruction, unspecified as to partial versus complete obstruction: Principal | ICD-10-CM | POA: Diagnosis present

## 2019-09-04 DIAGNOSIS — R112 Nausea with vomiting, unspecified: Secondary | ICD-10-CM | POA: Diagnosis present

## 2019-09-04 DIAGNOSIS — Z91010 Allergy to peanuts: Secondary | ICD-10-CM

## 2019-09-04 DIAGNOSIS — Z886 Allergy status to analgesic agent status: Secondary | ICD-10-CM

## 2019-09-04 LAB — URINALYSIS, ROUTINE W REFLEX MICROSCOPIC
Bilirubin Urine: NEGATIVE
Glucose, UA: NEGATIVE mg/dL
Hgb urine dipstick: NEGATIVE
Ketones, ur: NEGATIVE mg/dL
Leukocytes,Ua: NEGATIVE
Nitrite: NEGATIVE
Protein, ur: NEGATIVE mg/dL
Specific Gravity, Urine: 1.019 (ref 1.005–1.030)
pH: 6 (ref 5.0–8.0)

## 2019-09-04 LAB — COMPREHENSIVE METABOLIC PANEL
ALT: 43 U/L (ref 0–44)
AST: 37 U/L (ref 15–41)
Albumin: 4.5 g/dL (ref 3.5–5.0)
Alkaline Phosphatase: 85 U/L (ref 38–126)
Anion gap: 12 (ref 5–15)
BUN: 19 mg/dL (ref 8–23)
CO2: 21 mmol/L — ABNORMAL LOW (ref 22–32)
Calcium: 9.5 mg/dL (ref 8.9–10.3)
Chloride: 106 mmol/L (ref 98–111)
Creatinine, Ser: 1.02 mg/dL (ref 0.61–1.24)
GFR calc Af Amer: >60 mL/min (ref >60)
GFR calc non Af Amer: >60 mL/min (ref >60)
Glucose, Bld: 148 mg/dL — ABNORMAL HIGH (ref 70–99)
Potassium: 4 mmol/L (ref 3.5–5.1)
Sodium: 139 mmol/L (ref 135–145)
Total Bilirubin: 0.9 mg/dL (ref 0.3–1.2)
Total Protein: 9 g/dL — ABNORMAL HIGH (ref 6.5–8.1)

## 2019-09-04 LAB — TROPONIN I (HIGH SENSITIVITY): Troponin I (High Sensitivity): 3 ng/L (ref <18)

## 2019-09-04 LAB — CBC
HCT: 50.2 % (ref 39.0–52.0)
Hemoglobin: 16.8 g/dL (ref 13.0–17.0)
MCH: 31.2 pg (ref 26.0–34.0)
MCHC: 33.5 g/dL (ref 30.0–36.0)
MCV: 93.1 fL (ref 80.0–100.0)
Platelets: 197 10*3/uL (ref 150–400)
RBC: 5.39 MIL/uL (ref 4.22–5.81)
RDW: 14.2 % (ref 11.5–15.5)
WBC: 11.1 10*3/uL — ABNORMAL HIGH (ref 4.0–10.5)
nRBC: 0 % (ref 0.0–0.2)

## 2019-09-04 LAB — LIPASE, BLOOD: Lipase: 26 U/L (ref 11–51)

## 2019-09-04 MED ORDER — ONDANSETRON HCL 4 MG/2ML IJ SOLN
4.0000 mg | Freq: Once | INTRAMUSCULAR | Status: AC
  Administered 2019-09-05: 4 mg via INTRAVENOUS
  Filled 2019-09-04: qty 2

## 2019-09-04 MED ORDER — MORPHINE SULFATE (PF) 4 MG/ML IV SOLN
4.0000 mg | Freq: Once | INTRAVENOUS | Status: AC
  Administered 2019-09-05: 4 mg via INTRAVENOUS
  Filled 2019-09-04: qty 1

## 2019-09-04 MED ORDER — SODIUM CHLORIDE 0.9% FLUSH
3.0000 mL | Freq: Once | INTRAVENOUS | Status: AC
  Administered 2019-09-05: 3 mL via INTRAVENOUS

## 2019-09-04 MED ORDER — ONDANSETRON HCL 4 MG/2ML IJ SOLN
4.0000 mg | Freq: Once | INTRAMUSCULAR | Status: AC | PRN
  Administered 2019-09-04: 4 mg via INTRAVENOUS
  Filled 2019-09-04: qty 2

## 2019-09-04 MED ORDER — SODIUM CHLORIDE 0.9 % IV SOLN
INTRAVENOUS | Status: DC
  Administered 2019-09-05: 125 mL/h via INTRAVENOUS
  Administered 2019-09-05: 04:00:00 via INTRAVENOUS

## 2019-09-04 NOTE — ED Notes (Signed)
Rn attempted x2 for IV to give patient anti-nausea medication

## 2019-09-04 NOTE — ED Provider Notes (Signed)
TIME SEEN: 11:43 PM  CHIEF COMPLAINT: Abdominal pain  HPI: Patient is a 63 year old male with history of ulcer at the duodenal bulb seen on endoscopy in 2010, esophagitis, previous bowel obstruction who presents to the emergency department with left lower quadrant abdominal pain that started after eating this morning.  States he did have a bowel movement this morning but it was very small which is abnormal for him.  He is unsure if he is passing gas.  He has had nausea and vomiting.  States this feels similar to his ulcers.  He does not recall having a bowel obstruction previously.  Denies fevers, diarrhea, chest pain, shortness of breath, cough.  No previous abdominal surgeries.  [place] interpreter used.  ROS: See HPI Constitutional: no fever  Eyes: no drainage  ENT: no runny nose   Cardiovascular:  no chest pain  Resp: no SOB  GI:  vomiting GU: no dysuria Integumentary: no rash  Allergy: no hives  Musculoskeletal: no leg swelling  Neurological: no slurred speech ROS otherwise negative  PAST MEDICAL HISTORY/PAST SURGICAL HISTORY:  Past Medical History:  Diagnosis Date  . Constipation   . Ulcer     MEDICATIONS:  Prior to Admission medications   Medication Sig Start Date End Date Taking? Authorizing Provider  acetaminophen (TYLENOL) 500 MG tablet Take 1,000 mg by mouth every 6 (six) hours as needed (pain).    [provider]  amoxicillin-clavulanate (AUGMENTIN) 875-125 MG tablet Take 1 tablet by mouth every 12 (twelve) hours. Patient not taking: Reported on 04/25/2017 03/13/17   Kinnie Feil, PA-C  benzonatate (TESSALON PERLES) 100 MG capsule Take 1 capsule (100 mg total) by mouth 3 (three) times daily as needed for cough. FOR DISRUPTIVE DAY TIME COUGH Patient not taking: Reported on 04/25/2017 03/13/17   Kinnie Feil, PA-C  HYDROcodone-acetaminophen [facility]) 5-325 MG tablet Take 1 tablet by mouth every 6 (six) hours as needed. Patient not taking: Reported on  04/25/2017 11/12/16   Sherwood Gambler, MD  HYDROcodone-homatropine [facility]) 5-1.5 MG/5ML syrup Take 5 mLs by mouth every 6 (six) hours as needed for cough. FOR DISRUPTIVE NIGHT TIME COUGH AND BODY ACHES Patient not taking: Reported on 04/25/2017 03/13/17   Kinnie Feil, PA-C  Multiple Vitamin (MULTIVITAMIN WITH MINERALS) TABS tablet Take 1 tablet by mouth daily.    [provider]  ondansetron (ZOFRAN ODT) 4 MG disintegrating tablet 4mg  ODT q4 hours prn nausea/vomit Patient not taking: Reported on 11/12/2016 06/07/16   Milton Ferguson, MD  pantoprazole (PROTONIX) 20 MG tablet Take 1 tablet (20 mg total) by mouth daily. 04/25/17   Mabe, Forbes Cellar, MD    ALLERGIES:  Allergies  Allergen Reactions  . Asa [Aspirin] Other (See Comments)    Current and Hx of GI ulcer - per Pt and friend/translator  . Nsaids Other (See Comments)    Current and Hx of GI ulcer per pt and friend/translator  . Peanut-Containing Drug Products     Pain.      SOCIAL HISTORY:  Social History   Tobacco Use  . Smoking status: Never Smoker  . Smokeless tobacco: Never Used  Substance Use Topics  . Alcohol use: No    FAMILY HISTORY: History reviewed. No pertinent family history.  EXAM: BP (!) 166/95 (BP Location: Right Arm)   Pulse 100   Temp 98 F (36.7 C) (Oral)   Resp 18   SpO2 98%  CONSTITUTIONAL: Alert and oriented and responds appropriately to questions.  Appears uncomfortable HEAD: Normocephalic EYES:  Conjunctivae clear, pupils appear equal, EOMI ENT: normal nose; moist mucous membranes NECK: Supple, no meningismus, no nuchal rigidity, no LAD  CARD: RRR; S1 and S2 appreciated; no murmurs, no clicks, no rubs, no gallops RESP: Normal chest excursion without splinting or tachypnea; breath sounds clear and equal bilaterally; no wheezes, no rhonchi, no rales, no hypoxia or respiratory distress, speaking full sentences ABD/GI: Normal bowel sounds; non-distended; soft, tender to palpation in the  left lower quadrant without guarding or rebound, no peritoneal signs BACK:  The back appears normal and is non-tender to palpation, there is no CVA tenderness EXT: Normal ROM in all joints; non-tender to palpation; no edema; normal capillary refill; no cyanosis, no calf tenderness or swelling    SKIN: Normal color for age and race; warm; no rash NEURO: Moves all extremities equally PSYCH: The patient's mood and manner are appropriate. Grooming and personal hygiene are appropriate.  MEDICAL DECISION MAKING: Patient here with lower abdominal pain, decreased bowel movements, nausea and vomiting.  He states this feels similar to his ulcers however he has no epigastric tenderness on exam and has previously had a bowel obstruction.  I am concerned that he could have another bowel obstruction today and have recommended a CT of the abdomen pelvis.  Will give pain and nausea medicine as well as IV fluids.  Labs today show mild leukocytosis.  Otherwise labs unremarkable.  Troponin obtained in triage which is negative but patient denies any chest discomfort or shortness of breath.  EKG x2 shows no ischemic abnormality.  Urine shows no infection.  Differential also includes colitis, diverticulitis, kidney stone.  Less likely appendicitis, pyelonephritis.   ED PROGRESS: Patient CT scan shows a small bowel obstruction.  Previous bowel obstruction was in 2015 which resolved medically.  Will place NG tube.  Will discuss with medicine for admission.   2:19 AM Discussed patient's case with hospitalist, Dr. Toniann FailKakrakandy.  I have recommended admission and patient (and family if present) agree with this plan. Admitting physician will place admission orders.   I reviewed all nursing notes, vitals, pertinent previous records, EKGs, lab and urine results, imaging (as available).     EKG Interpretation  Date/Time:  Sunday September 04 2019 22:32:44 EDT Ventricular Rate:  93 PR Interval:    QRS Duration: 113 QT  Interval:  355 QTC Calculation: 442 R Axis:   -4 Text Interpretation:  Sinus rhythm St elevation anteriorly, similiar to prior no reciprocal changes No significant change since last tracing Reconfirmed by Virgina NorfolkAdam, [place] [phone]([id number]) on 09/04/2019 10:48:14 PM       EKG Interpretation  Date/Time:  Sunday September 04 2019 22:35:29 EDT Ventricular Rate:  90 PR Interval:    QRS Duration: 90 QT Interval:  383 QTC Calculation: 469 R Axis:   -34 Text Interpretation:  Sinus rhythm Left axis deviation Probable anteroseptal infarct, recent No significant change since last tracing Confirmed by Kristi Hyer, Baxter HireKristen [phone]([id number]) on 09/05/2019 12:39:27 AM           Jeramy Dimmick, Layla MawKristen N, DO 09/05/19 [id number]

## 2019-09-04 NOTE — ED Triage Notes (Addendum)
Pt BIB [facility] c/o abdominal pain after dinner today. One episode of vomiting after trying to take an antacid pill with a bottle of water. Pt reports gas. Denies chest pain. Reports a hx of GERD. Pt is moaning loudly.

## 2019-09-04 NOTE — ED Notes (Signed)
Urine culture sent to the lab. 

## 2019-09-05 ENCOUNTER — Emergency Department (HOSPITAL_COMMUNITY): Payer: No Typology Code available for payment source

## 2019-09-05 ENCOUNTER — Other Ambulatory Visit: Payer: Self-pay

## 2019-09-05 ENCOUNTER — Encounter (HOSPITAL_COMMUNITY): Payer: Self-pay

## 2019-09-05 ENCOUNTER — Inpatient Hospital Stay (HOSPITAL_COMMUNITY): Payer: No Typology Code available for payment source

## 2019-09-05 DIAGNOSIS — Z20828 Contact with and (suspected) exposure to other viral communicable diseases: Secondary | ICD-10-CM | POA: Diagnosis present

## 2019-09-05 DIAGNOSIS — K56609 Unspecified intestinal obstruction, unspecified as to partial versus complete obstruction: Secondary | ICD-10-CM | POA: Diagnosis present

## 2019-09-05 DIAGNOSIS — K279 Peptic ulcer, site unspecified, unspecified as acute or chronic, without hemorrhage or perforation: Secondary | ICD-10-CM | POA: Diagnosis not present

## 2019-09-05 DIAGNOSIS — Z8711 Personal history of peptic ulcer disease: Secondary | ICD-10-CM

## 2019-09-05 DIAGNOSIS — Z886 Allergy status to analgesic agent status: Secondary | ICD-10-CM | POA: Diagnosis not present

## 2019-09-05 DIAGNOSIS — R1084 Generalized abdominal pain: Secondary | ICD-10-CM | POA: Diagnosis not present

## 2019-09-05 DIAGNOSIS — R9431 Abnormal electrocardiogram [ECG] [EKG]: Secondary | ICD-10-CM | POA: Diagnosis present

## 2019-09-05 DIAGNOSIS — R1032 Left lower quadrant pain: Secondary | ICD-10-CM | POA: Diagnosis present

## 2019-09-05 DIAGNOSIS — Z91010 Allergy to peanuts: Secondary | ICD-10-CM | POA: Diagnosis not present

## 2019-09-05 LAB — CBC WITH DIFFERENTIAL/PLATELET
Abs Immature Granulocytes: 0.07 10*3/uL (ref 0.00–0.07)
Basophils Absolute: 0.1 10*3/uL (ref 0.0–0.1)
Basophils Relative: 0 %
Eosinophils Absolute: 0 10*3/uL (ref 0.0–0.5)
Eosinophils Relative: 0 %
HCT: 48.8 % (ref 39.0–52.0)
Hemoglobin: 16 g/dL (ref 13.0–17.0)
Immature Granulocytes: 1 %
Lymphocytes Relative: 22 %
Lymphs Abs: 2.6 10*3/uL (ref 0.7–4.0)
MCH: 31.2 pg (ref 26.0–34.0)
MCHC: 32.8 g/dL (ref 30.0–36.0)
MCV: 95.1 fL (ref 80.0–100.0)
Monocytes Absolute: 0.9 10*3/uL (ref 0.1–1.0)
Monocytes Relative: 8 %
Neutro Abs: 8.1 10*3/uL — ABNORMAL HIGH (ref 1.7–7.7)
Neutrophils Relative %: 69 %
Platelets: 192 10*3/uL (ref 150–400)
RBC: 5.13 MIL/uL (ref 4.22–5.81)
RDW: 14.6 % (ref 11.5–15.5)
WBC: 11.7 10*3/uL — ABNORMAL HIGH (ref 4.0–10.5)
nRBC: 0 % (ref 0.0–0.2)

## 2019-09-05 LAB — COMPREHENSIVE METABOLIC PANEL
ALT: 36 U/L (ref 0–44)
AST: 31 U/L (ref 15–41)
Albumin: 3.9 g/dL (ref 3.5–5.0)
Alkaline Phosphatase: 73 U/L (ref 38–126)
Anion gap: 12 (ref 5–15)
BUN: 16 mg/dL (ref 8–23)
CO2: 22 mmol/L (ref 22–32)
Calcium: 9 mg/dL (ref 8.9–10.3)
Chloride: 104 mmol/L (ref 98–111)
Creatinine, Ser: 1.05 mg/dL (ref 0.61–1.24)
GFR calc Af Amer: >60 mL/min (ref >60)
GFR calc non Af Amer: >60 mL/min (ref >60)
Glucose, Bld: 111 mg/dL — ABNORMAL HIGH (ref 70–99)
Potassium: 3.9 mmol/L (ref 3.5–5.1)
Sodium: 138 mmol/L (ref 135–145)
Total Bilirubin: 0.9 mg/dL (ref 0.3–1.2)
Total Protein: 7.9 g/dL (ref 6.5–8.1)

## 2019-09-05 LAB — SARS CORONAVIRUS 2 (TAT 6-24 HRS): SARS Coronavirus 2: NEGATIVE

## 2019-09-05 MED ORDER — ACETAMINOPHEN 325 MG PO TABS: 650.0000 mg | ORAL_TABLET | Freq: Four times a day (QID) | ORAL | Status: DC | PRN

## 2019-09-05 MED ORDER — ONDANSETRON HCL 4 MG/2ML IJ SOLN
4.0000 mg | Freq: Four times a day (QID) | INTRAMUSCULAR | Status: DC | PRN
  Administered 2019-09-05: 4 mg via INTRAVENOUS
  Filled 2019-09-05: qty 2

## 2019-09-05 MED ORDER — PANTOPRAZOLE SODIUM 40 MG IV SOLR
40.0000 mg | INTRAVENOUS | Status: DC
  Administered 2019-09-05 – 2019-09-07 (×3): 40 mg via INTRAVENOUS
  Filled 2019-09-05 (×3): qty 40

## 2019-09-05 MED ORDER — MORPHINE SULFATE (PF) 2 MG/ML IV SOLN: 1.0000 mg | INTRAVENOUS | Status: DC | PRN

## 2019-09-05 MED ORDER — MORPHINE SULFATE (PF) 2 MG/ML IV SOLN
1.0000 mg | INTRAVENOUS | Status: DC | PRN
  Administered 2019-09-05: 1 mg via INTRAVENOUS
  Filled 2019-09-05: qty 1

## 2019-09-05 MED ORDER — DEXTROSE-NACL 5-0.9 % IV SOLN
INTRAVENOUS | Status: DC
  Administered 2019-09-05 (×2): via INTRAVENOUS

## 2019-09-05 MED ORDER — IOHEXOL 300 MG/ML  SOLN
100.0000 mL | Freq: Once | INTRAMUSCULAR | Status: AC | PRN
  Administered 2019-09-05: 100 mL via INTRAVENOUS

## 2019-09-05 MED ORDER — ACETAMINOPHEN 650 MG RE SUPP: 650.0000 mg | Freq: Four times a day (QID) | RECTAL | Status: DC | PRN

## 2019-09-05 MED ORDER — ONDANSETRON HCL 4 MG PO TABS: 4.0000 mg | ORAL_TABLET | Freq: Four times a day (QID) | ORAL | Status: DC | PRN

## 2019-09-05 NOTE — Progress Notes (Addendum)
Pt seen and examined.  Passing some flatus, very small BM this am.  Abd pain is better.  Afeb and WBC 11.7k.  Abd is benign, has NG tube in. Chest/ Cor / LE normal on exam.  Full prog note planned for tomorrow.  Pt was admitted after MN here. Pt repeatedly states he has "had this many times" and he "does not want any surgery".  Will curbside gen surg as needed.   Kelly Splinter MD  [facility]  pgr [phone] 09/05/2019, 1:47 PM

## 2019-09-05 NOTE — H&P (Addendum)
History and Physical    Caleb Rivers VPX:[id number] DOB: [birth year] DOA: 09/04/2019  PCP: Patient, No Pcp Per  Patient coming from: Home.  Chief Complaint: Abdominal pain nausea vomiting.  [place] interpreter used.  HPI: Caleb Rivers is a 63 y.o. male with history of peptic ulcer disease presents to the ER with complaints of abdominal pain with nausea vomiting.  Patient symptoms started yesterday morning.  Pain is diffuse all over the abdomen with multiple episodes of vomiting.  No blood in the vomitus but has noticed some blood-tinged bowel movement last few days.  Last bowel movement was yesterday which was a small quantity.  Due to persistent pain patient presents to the ER.  Insert [id number] patient was admitted for similar episode when no definite cause of the obstruction was known.  ED Course: In the ER patient had CT abdomen pelvis which shows features concerning for small bowel obstruction with no definite transition point seen.  Patient was placed on NG tube suction and admitted for further management.  Patient EKG does show some ST-T changes in the anterior leads and has had similar changes previously in 2014 when patient underwent cardiac cath and was showing normal coronaries at that time.  Presently patient denies any chest pain.  Troponin is negative.  COVID-19 test is pending.  Labs reveal blood glucose of 148 WBC count 11.3.   Review of Systems: As per HPI, rest all negative.   Past Medical History:  Diagnosis Date   Constipation    Ulcer     Past Surgical History:  Procedure Laterality Date   LEFT HEART CATHETERIZATION WITH CORONARY ANGIOGRAM N/A 09/25/2013   Procedure: LEFT HEART CATHETERIZATION WITH CORONARY ANGIOGRAM;  Surgeon: Troy Sine, MD;  Location: [facility] CATH LAB;  Service: Cardiovascular;  Laterality: N/A;     reports that he has never smoked. He has never used smokeless tobacco. He reports that he does not drink alcohol or use drugs.  Allergies    Allergen Reactions   Asa [Aspirin] Other (See Comments)    Current and Hx of GI ulcer - per Pt and friend/translator   Nsaids Other (See Comments)    Current and Hx of GI ulcer per pt and friend/translator   Peanut-Containing Drug Products     Pain.      Family History  Problem Relation Age of Onset   Diabetes Mellitus II Neg Hx    Cancer Neg Hx     Prior to Admission medications   Medication Sig Start Date End Date Taking? Authorizing Provider  acetaminophen (TYLENOL) 500 MG tablet Take 1,000 mg by mouth every 6 (six) hours as needed (pain).   Yes [provider]  alum & mag hydroxide-simeth (MAALOX MAX) 400-400-40 MG/5ML suspension Take 10 mLs by mouth every 6 (six) hours as needed for indigestion.   Yes [provider]  docusate sodium (COLACE) 100 MG capsule Take 100 mg by mouth daily as needed for mild constipation.   Yes [provider]  Multiple Vitamin (MULTIVITAMIN WITH MINERALS) TABS tablet Take 1 tablet by mouth daily.   Yes [provider]  pantoprazole (PROTONIX) 20 MG tablet Take 1 tablet (20 mg total) by mouth daily. 04/25/17  Yes Mabe, Forbes Cellar, MD    Physical Exam: Constitutional: Moderately built and nourished. Vitals:   09/05/19 0030 09/05/19 0216 09/05/19 0230 09/05/19 0334  BP: 123/77 (!) 141/92 (!) 170/102   Pulse: 94 82 (!) 103   Resp: 20 14 20    Temp:  98.6 F (37 C)    TempSrc:  Oral    SpO2: 97% 100% 99%   Weight:    101.2 kg  Height:    6\' 5"  (1.956 m)   Eyes: Anicteric no pallor. ENMT: No discharge from the ears eyes nose or mouth. Neck: No mass felt.  No neck rigidity. Respiratory: No rhonchi or crepitations. Cardiovascular: S1-S2 heard. Abdomen: Soft nontender bowel sounds not appreciated. Musculoskeletal: No edema. Skin: No rash. Neurologic: Alert awake oriented to time place and person.  Moves all extremities. Psychiatric: Appears normal.   Labs on Admission: I have personally reviewed  following labs and imaging studies  CBC: Recent Labs  Lab 09/04/19 2147  WBC 11.1*  HGB 16.8  HCT 50.2  MCV 93.1  PLT 197   Basic Metabolic Panel: Recent Labs  Lab 09/04/19 2147  NA 139  K 4.0  CL 106  CO2 21*  GLUCOSE 148*  BUN 19  CREATININE 1.02  CALCIUM 9.5   GFR: Estimated Creatinine Clearance: 94.6 mL/min (by C-G formula based on SCr of 1.02 mg/dL). Liver Function Tests: Recent Labs  Lab 09/04/19 2147  AST 37  ALT 43  ALKPHOS 85  BILITOT 0.9  PROT 9.0*  ALBUMIN 4.5   Recent Labs  Lab 09/04/19 2147  LIPASE 26   No results for input(s): AMMONIA in the last 168 hours. Coagulation Profile: No results for input(s): INR, PROTIME in the last 168 hours. Cardiac Enzymes: No results for input(s): CKTOTAL, CKMB, CKMBINDEX, TROPONINI in the last 168 hours. BNP (last 3 results) No results for input(s): PROBNP in the last 8760 hours. HbA1C: No results for input(s): HGBA1C in the last 72 hours. CBG: No results for input(s): GLUCAP in the last 168 hours. Lipid Profile: No results for input(s): CHOL, HDL, LDLCALC, TRIG, CHOLHDL, LDLDIRECT in the last 72 hours. Thyroid Function Tests: No results for input(s): TSH, T4TOTAL, FREET4, T3FREE, THYROIDAB in the last 72 hours. Anemia Panel: No results for input(s): VITAMINB12, FOLATE, FERRITIN, TIBC, IRON, RETICCTPCT in the last 72 hours. Urine analysis:    Component Value Date/Time   COLORURINE YELLOW 09/04/2019 2147   APPEARANCEUR CLEAR 09/04/2019 2147   LABSPEC 1.019 09/04/2019 2147   PHURINE 6.0 09/04/2019 2147   GLUCOSEU NEGATIVE 09/04/2019 2147   HGBUR NEGATIVE 09/04/2019 2147   BILIRUBINUR NEGATIVE 09/04/2019 2147   KETONESUR NEGATIVE 09/04/2019 2147   PROTEINUR NEGATIVE 09/04/2019 2147   UROBILINOGEN 0.2 08/30/2014 1922   NITRITE NEGATIVE 09/04/2019 2147   LEUKOCYTESUR NEGATIVE 09/04/2019 2147   Sepsis Labs: @LABRCNTIP (procalcitonin:4,lacticidven:4) )No results found for this or any previous visit  (from the past 240 hour(s)).   Radiological Exams on Admission: Dg Abdomen 1 View  Result Date: 09/05/2019 CLINICAL DATA:  NG tube placement EXAM: ABDOMEN - 1 VIEW COMPARISON:  None. FINDINGS: Tip the NG tube is seen projecting over the distal stomach. The bowel gas pattern is normal. No radio-opaque calculi or other significant radiographic abnormality are seen. IMPRESSION: Tip of NG tube seen projecting over the distal stomach. Electronically Signed   By: Jonna ClarkBindu  Avutu M.D.   On: 09/05/2019 03:00   Ct Abdomen Pelvis W Contrast  Result Date: 09/05/2019 CLINICAL DATA:  Abdominal pain.  Prior small bowel obstruction. EXAM: CT ABDOMEN AND PELVIS WITH CONTRAST TECHNIQUE: Multidetector CT imaging of the abdomen and pelvis was performed using the standard protocol following bolus administration of intravenous contrast. CONTRAST:  100mL OMNIPAQUE IOHEXOL 300 MG/ML  SOLN COMPARISON:  08/30/2014 FINDINGS: Lower chest: Lung bases are clear. No  effusions. Heart is normal size. Hepatobiliary: No focal hepatic abnormality. Gallbladder unremarkable. Pancreas: No focal abnormality or ductal dilatation. Spleen: No focal abnormality.  Normal size. Adrenals/Urinary Tract: No adrenal abnormality. No focal renal abnormality. No stones or hydronephrosis. Urinary bladder is unremarkable. Stomach/Bowel: Dilated small bowel loops centrally in the abdomen and pelvis. Proximal small bowel is decompressed. Findings concerning for distal small bowel obstruction. Exact transition not visualized. Stomach grossly unremarkable. Vascular/Lymphatic: No evidence of aneurysm or adenopathy. Reproductive: No visible focal abnormality. Other: Small amount of free fluid adjacent to the liver. No free air. Musculoskeletal: No acute bony abnormality. IMPRESSION: Dilated small bowel loops concerning for small bowel obstruction. Exact transition not visualized. Small amount of perihepatic ascites. Electronically Signed   By: Charlett Nose M.D.   On:  09/05/2019 01:51    EKG: Independently reviewed.  Normal sinus rhythm with ST-T changes in the anterior leads.  Assessment/Plan Principal Problem:   SBO (small bowel obstruction) (HCC) Active Problems:   Abnormal resting ECG findings - repolarization changes; NOT STEMI   Peptic ulcer disease    1. Abdominal pain with nausea vomiting likely from small bowel obstruction -CT scan does not show any definite transition point.  Will consult general surgery.  NG tube suction n.p.o. IV fluids pain relief medication. 2. History of peptic ulcer disease per EGD done in 2010.  Has had noticed some blood in the stools recently.  Follow CBC.  We will keep patient on Protonix IV. 3. Abnormal EKG -patient has had history of abnormal EKG also had undergone cardiac cath in 2014 which showed normal coronaries.  Presently denies any chest pain and troponin initially was negative.  Given that patient has small bowel obstruction with abdominal pain and persistent vomiting will need close monitoring for any further deterioration and will keep patient inpatient.   COVID-19 test is pending.   DVT prophylaxis: SCDs in anticipation of procedure. Code Status: Full code. Family Communication: Discussed with patient. Disposition Plan: Home. Consults called: General surgery to be consulted. Admission status: Inpatient.   Eduard Clos MD [facility] Pager [phone].  If 7PM-7AM, please contact night-coverage [email or web address] Password [facility]  09/05/2019, 5:26 AM

## 2019-09-05 NOTE — ED Notes (Addendum)
ED TO INPATIENT HANDOFF REPORT  ED Nurse Name and Phone #: Sharene SkeansJeneen Xylon Croom [phone]  S Name/Age/Gender Caleb Rivers 63 y.o. male Room/Bed: WA17/WA17  Code Status   Code Status: Prior  Home/SNF/Other Home Patient oriented to: self, place, time and situation Is this baseline? Yes   Triage Complete: Triage complete  Chief Complaint abd pain   Triage Note Pt BIB [facility] c/o abdominal pain after dinner today. One episode of vomiting after trying to take an antacid pill with a bottle of water. Pt reports gas. Denies chest pain. Reports a hx of GERD. Pt is moaning loudly.    Allergies Allergies  Allergen Reactions  . Asa [Aspirin] Other (See Comments)    Current and Hx of GI ulcer - per Pt and friend/translator  . Nsaids Other (See Comments)    Current and Hx of GI ulcer per pt and friend/translator  . Peanut-Containing Drug Products     Pain.      Level of Care/Admitting Diagnosis ED Disposition    ED Disposition Condition Comment   Admit  [facility] [[id number]]  Level of Care: Telemetry [5]  Admit to tele based on following criteria: Monitor for Ischemic changes  Covid Evaluation: Asymptomatic Screening Protocol (No Symptoms)  Diagnosis: SBO (small bowel obstruction) [facility](HCC) [[id number]][[id number]]  Admitting Physician: Eduard ClosKAKRAKANDY, ARSHAD N [phone]]  Attending Physician: Eduard ClosKAKRAKANDY, ARSHAD N [phone]]  Estimated length of stay: past midnight tomorrow  Certification:: I certify this patient will need inpatient services for at least 2 midnights  PT Class (Do Not Modify): Inpatient [101]  PT Acc Code (Do Not Modify): Private [1]       B Medical/Surgery History Past Medical History:  Diagnosis Date  . Constipation   . Ulcer    Past Surgical History:  Procedure Laterality Date  . LEFT HEART CATHETERIZATION WITH CORONARY ANGIOGRAM N/A 09/25/2013   Procedure: LEFT HEART CATHETERIZATION WITH CORONARY ANGIOGRAM;  Surgeon: Lennette Biharihomas A Kelly, MD;  [facility];  Service: Cardiovascular;  Laterality: N/A;     A IV Location/Drains/Wounds Patient Lines/Drains/Airways Status   Active Line/Drains/Airways    Name:   Placement date:   Placement time:   Site:   Days:   Peripheral IV 09/04/19 Left Hand   09/04/19    2308    Hand   1   Peripheral IV 09/05/19 Right Forearm   09/05/19    0013    Forearm   less than 1   Incision 09/25/13 Groin Right   09/25/13    1057     2171          Intake/Output Last 24 hours  Intake/Output Summary (Last 24 hours) at 09/05/2019 0243 Last data filed at 09/05/2019 0006 Gross per 24 hour  Intake 3 ml  Output -  Net 3 ml    Labs/Imaging Results for orders placed or performed during the hospital encounter of 09/04/19 (from the past 48 hour(s))  Lipase, blood     Status: None   Collection Time: 09/04/19  9:47 PM  Result Value Ref Range   Lipase 26 11 - 51 U/L    Comment: Performed at [facility], [address] [place], [place] [id number]  Comprehensive metabolic panel     Status: Abnormal   Collection Time: 09/04/19  9:47 PM  Result Value Ref Range   Sodium 139 135 - 145 mmol/L   Potassium 4.0 3.5 - 5.1 mmol/L   Chloride 106 98 - 111 mmol/L  CO2 21 (L) 22 - 32 mmol/L   Glucose, Bld 148 (H) 70 - 99 mg/dL   BUN 19 8 - 23 mg/dL   Creatinine, Ser 1.02 0.61 - 1.24 mg/dL   Calcium 9.5 8.9 - 10.3 mg/dL   Total Protein 9.0 (H) 6.5 - 8.1 g/dL   Albumin 4.5 3.5 - 5.0 g/dL   AST 37 15 - 41 U/L   ALT 43 0 - 44 U/L   Alkaline Phosphatase 85 38 - 126 U/L   Total Bilirubin 0.9 0.3 - 1.2 mg/dL   GFR calc non Af Amer >60 >60 mL/min   GFR calc Af Amer >60 >60 mL/min   Anion gap 12 5 - 15    Comment: Performed at [facility], [place] [address], [place], [place] [address]  CBC     Status: Abnormal   Collection Time: 09/04/19  9:47 PM  Result Value Ref Range   WBC 11.1 (H) 4.0 - 10.5 K/uL   RBC 5.39 4.22 - 5.81 MIL/uL   Hemoglobin 16.8 13.0 - 17.0 g/dL   HCT 50.2 39.0 - 52.0 %    MCV 93.1 80.0 - 100.0 fL   MCH 31.2 26.0 - 34.0 pg   MCHC 33.5 30.0 - 36.0 g/dL   RDW 14.2 11.5 - 15.5 %   Platelets 197 150 - 400 K/uL   nRBC 0.0 0.0 - 0.2 %    Comment: Performed at [facility], [place] [address], [place], [place] [address]  Urinalysis, Routine w reflex microscopic     Status: None   Collection Time: 09/04/19  9:47 PM  Result Value Ref Range   Color, Urine YELLOW YELLOW   APPearance CLEAR CLEAR   Specific Gravity, Urine 1.019 1.005 - 1.030   pH 6.0 5.0 - 8.0   Glucose, UA NEGATIVE NEGATIVE mg/dL   Hgb urine dipstick NEGATIVE NEGATIVE   Bilirubin Urine NEGATIVE NEGATIVE   Ketones, ur NEGATIVE NEGATIVE mg/dL   Protein, ur NEGATIVE NEGATIVE mg/dL   Nitrite NEGATIVE NEGATIVE   Leukocytes,Ua NEGATIVE NEGATIVE    Comment: Performed at [place] [address], [place], [place] [address]  Troponin I (High Sensitivity)     Status: None   Collection Time: 09/04/19  9:47 PM  Result Value Ref Range   Troponin I (High Sensitivity) 3 <18 ng/L    Comment: (NOTE) Elevated high sensitivity troponin I (hsTnI) values and significant  changes across serial measurements may suggest ACS but many other  chronic and acute conditions are known to elevate hsTnI results.  Refer to the "Links" section for chest pain algorithms and additional  guidance. Performed at [facility], [place] [address], [place], [place] [address]    Ct Abdomen Pelvis W Contrast  Result Date: 09/05/2019 CLINICAL DATA:  Abdominal pain.  Prior small bowel obstruction. EXAM: CT ABDOMEN AND PELVIS WITH CONTRAST TECHNIQUE: Multidetector CT imaging of the abdomen and pelvis was performed using the standard protocol following bolus administration of intravenous contrast. CONTRAST:  162mL OMNIPAQUE IOHEXOL 300 MG/ML  SOLN COMPARISON:  08/30/2014 FINDINGS: Lower chest: Lung bases are clear. No effusions. Heart is normal size. Hepatobiliary: No focal hepatic  abnormality. Gallbladder unremarkable. Pancreas: No focal abnormality or ductal dilatation. Spleen: No focal abnormality.  Normal size. Adrenals/Urinary Tract: No adrenal abnormality. No focal renal abnormality. No stones or hydronephrosis. Urinary bladder is unremarkable. Stomach/Bowel: Dilated small bowel loops centrally in the abdomen and pelvis. Proximal small bowel is decompressed. Findings concerning for  distal small bowel obstruction. Exact transition not visualized. Stomach grossly unremarkable. Vascular/Lymphatic: No evidence of aneurysm or adenopathy. Reproductive: No visible focal abnormality. Other: Small amount of free fluid adjacent to the liver. No free air. Musculoskeletal: No acute bony abnormality. IMPRESSION: Dilated small bowel loops concerning for small bowel obstruction. Exact transition not visualized. Small amount of perihepatic ascites. Electronically Signed   By: Charlett Nose M.D.   On: 09/05/2019 01:51    Pending Labs Unresulted Labs (From admission, onward)    Start     Ordered   09/05/19 0220  SARS CORONAVIRUS 2 (TAT 6-24 HRS) Nasopharyngeal Nasopharyngeal Swab  (Asymptomatic/Tier 2 Patients Labs)  ONCE - STAT,   STAT    Question Answer Comment  Is this test for diagnosis or screening Screening   Symptomatic for COVID-19 as defined by CDC No   Hospitalized for COVID-19 Unknown   Admitted to ICU for COVID-19 Unknown   Previously tested for COVID-19 Unknown   Resident in a congregate (group) care setting Unknown   Employed in healthcare setting Unknown      09/05/19 0219          Vitals/Pain Today's Vitals   09/05/19 0012 09/05/19 0030 09/05/19 0047 09/05/19 0216  BP:  123/77  (!) 141/92  Pulse:  94  82  Resp:  20  14  Temp:    98.6 F (37 C)  TempSrc:    Oral  SpO2:  97%  100%  Weight: 108.9 kg     Height: 6\' 3"  (1.905 m)     PainSc:   3  0-No pain    Isolation Precautions No active isolations  Medications Medications  0.9 %  sodium chloride  infusion (125 mL/hr Intravenous New Bag/Given 09/05/19 0005)  sodium chloride flush (NS) 0.9 % injection 3 mL (3 mLs Intravenous Given 09/05/19 0006)  ondansetron (ZOFRAN) injection 4 mg (4 mg Intravenous Given 09/04/19 2308)  morphine 4 MG/ML injection 4 mg (4 mg Intravenous Given 09/05/19 0003)  ondansetron (ZOFRAN) injection 4 mg (4 mg Intravenous Given 09/05/19 0003)  iohexol (OMNIPAQUE) 300 MG/ML solution 100 mL (100 mLs Intravenous Contrast Given 09/05/19 0132)    Mobility walks Low fall risk   Focused Assessments GI   R Recommendations: See Admitting Provider Note  Report given to:   Additional Notes:  Nurse told to call back because of covid swab not back.

## 2019-09-05 NOTE — Plan of Care (Signed)
  Problem: Pain Managment: Goal: General experience of comfort will improve Outcome: Progressing   Problem: Safety: Goal: Ability to remain free from injury will improve Outcome: Progressing   Problem: Skin Integrity: Goal: Risk for impaired skin integrity will decrease Outcome: Progressing   

## 2019-09-06 ENCOUNTER — Inpatient Hospital Stay (HOSPITAL_COMMUNITY): Payer: No Typology Code available for payment source

## 2019-09-06 DIAGNOSIS — Z8711 Personal history of peptic ulcer disease: Secondary | ICD-10-CM

## 2019-09-06 DIAGNOSIS — R9431 Abnormal electrocardiogram [ECG] [EKG]: Secondary | ICD-10-CM

## 2019-09-06 DIAGNOSIS — R1084 Generalized abdominal pain: Secondary | ICD-10-CM

## 2019-09-06 LAB — BASIC METABOLIC PANEL
Anion gap: 6 (ref 5–15)
BUN: 15 mg/dL (ref 8–23)
CO2: 24 mmol/L (ref 22–32)
Calcium: 8.6 mg/dL — ABNORMAL LOW (ref 8.9–10.3)
Chloride: 107 mmol/L (ref 98–111)
Creatinine, Ser: 1.09 mg/dL (ref 0.61–1.24)
GFR calc Af Amer: >60 mL/min (ref >60)
GFR calc non Af Amer: >60 mL/min (ref >60)
Glucose, Bld: 109 mg/dL — ABNORMAL HIGH (ref 70–99)
Potassium: 3.9 mmol/L (ref 3.5–5.1)
Sodium: 137 mmol/L (ref 135–145)

## 2019-09-06 LAB — CBC
HCT: 48.3 % (ref 39.0–52.0)
Hemoglobin: 15.7 g/dL (ref 13.0–17.0)
MCH: 30.7 pg (ref 26.0–34.0)
MCHC: 32.5 g/dL (ref 30.0–36.0)
MCV: 94.3 fL (ref 80.0–100.0)
Platelets: 178 10*3/uL (ref 150–400)
RBC: 5.12 MIL/uL (ref 4.22–5.81)
RDW: 14.6 % (ref 11.5–15.5)
WBC: 9.5 10*3/uL (ref 4.0–10.5)
nRBC: 0 % (ref 0.0–0.2)

## 2019-09-06 LAB — HIV ANTIBODY (ROUTINE TESTING W REFLEX): HIV Screen 4th Generation wRfx: NONREACTIVE

## 2019-09-06 NOTE — Progress Notes (Signed)
Pt called RN to room and stated "something is wrong". Upon entering pt room, RN noticed NGT almost all the way out of pt nare. Suction was immediately turned off. Pt removed NGT completely before RN could stop pt. VSS. BP 145/95. HR 80. RR 18. O2 100% on RA. RN text-paged on call Baltazar Najjar, NP and made aware. Order was given to replace NGT. RN explained to pt about replacing NGT. Pt declined tube placement and expressed he felt fine and wishes to speak to the MD in the morning before replacing NGT. RN advised pt if he became nauseous or vomits, the NGT will be replaced. Will continue to monitor the pt very closely.

## 2019-09-06 NOTE — Progress Notes (Signed)
Pt has had a total of 200 mL NGT output this shift. Light brown in color.

## 2019-09-06 NOTE — Progress Notes (Signed)
Patient has remained without complaints of nausea, vomiting or abdominal pain. Has been passing gas and belching. Has tolerated clears and full liquids thus far. Continue to monitor. Eulas Post, RN

## 2019-09-06 NOTE — Progress Notes (Signed)
[facility] Progress Note  Subjective: overnight NG came out.  Pt's states passing a lot of "gas" and no further abd pain.  F/U AXR is pending.  Wants to eat, have ordered clear liquids to advance as tolerated.   Vitals:   09/05/19 1517 09/05/19 2249 09/06/19 0242 09/06/19 0643  BP: (!) 155/96 (!) 155/99 (!) 145/95 (!) 149/85  Pulse: 69 76 80 81  Resp:  18 18 20   Temp: 98 F (36.7 C) 98.9 F (37.2 C) 99.3 F (37.4 C) 99.8 F (37.7 C)  TempSrc: Oral Oral Oral Oral  SpO2: 99% 100% 100% 100%  Weight:      Height:        Inpatient medications: . pantoprazole (PROTONIX) IV  40 mg Intravenous Q24H    acetaminophen **OR** acetaminophen, morphine injection, ondansetron **OR** ondansetron (ZOFRAN) IV  Exam: Pt alert, NG out  no jvd  Chest cta bilat  Cor reg no mrg  Abd soft ntnd no rebound, +BS  Ext no edema or wounds   NF , ox3 and cooperative and pleasant   HPI: Caleb Rivers is a 63 y.o. male with history of peptic ulcer disease presents to the ER with complaints of abdominal pain with nausea vomiting.  Patient symptoms started yesterday morning.  Pain is diffuse all over the abdomen with multiple episodes of vomiting.  No blood in the vomitus but has noticed some blood-tinged bowel movement last few days.  Last bowel movement was yesterday which was a small quantity.  Due to persistent pain patient presents to the ER.  Insert [id number] patient was admitted for similar episode when no definite cause of the obstruction was known. ED Course: In the ER patient had CT abdomen pelvis which shows features concerning for small bowel obstruction with no definite transition point seen.  Patient was placed on NG tube suction and admitted for further management.  Patient EKG does show some ST-T changes in the anterior leads and has had similar changes previously in 2014 when patient underwent cardiac cath and was showing normal coronaries at that time.  Presently patient denies any chest  pain.  Troponin is negative.  COVID-19 test is pending.  Labs reveal blood glucose of 148 WBC count 11.3.    Home meds: -  pantoprazole 20 qd  - prn's/ vitamins/ supplements     Hospital Course:  Abdominal pain with nausea vomiting: likely from small bowel obstruction -CT scan does not show any definite transition point.  Patient refused surgery consult. NG tube yesterday may have helped. Now NG is displaced/ out, but today patient is passing lots of gas and feels better, abd pain resolved, abdomen is flat and benign. Pt wants to eat - clear liquids started, advance as tolerated - f/u 2 view abd xrays are pending today (radiology is backed up currently) - possible dc tomorrow if continues to improve  History of peptic ulcer disease: per EGD done in 2010.  Has had noticed some blood in the stools recently.  Follow CBC.  We will keep patient on Protonix  Abnormal EKG -patient has had history of abnormal EKG also had undergone cardiac cath in 2014 which showed normal coronaries.  Presently denies any chest pain and troponin initially was negative.   COVID-19 test negative   DVT prophylaxis: SCDs, low risk DVT Code Status: Full code Family Communication: Discussed with patient Disposition Plan: Home 1-2 days if cont to improve Consults: none  Admission status: Inpatient     Rob Tax adviserchertz / [facility]  [phone] 09/06/2019, 2:11 PM   Recent Labs  Lab 09/04/19 2147 09/05/19 0823 09/06/19 0355  NA 139 138 137  K 4.0 3.9 3.9  CL 106 104 107  CO2 21* 22 24  GLUCOSE 148* 111* 109*  BUN 19 16 15   CREATININE 1.02 1.05 1.09  CALCIUM 9.5 9.0 8.6*   Recent Labs  Lab 09/04/19 2147 09/05/19 0823  AST 37 31  ALT 43 36  ALKPHOS 85 73  BILITOT 0.9 0.9  PROT 9.0* 7.9  ALBUMIN 4.5 3.9   Recent Labs  Lab 09/04/19 2147 09/05/19 0823 09/06/19 0355  WBC 11.1* 11.7* 9.5  NEUTROABS  --  8.1*  --   HGB 16.8 16.0 15.7  HCT 50.2 48.8 48.3  MCV 93.1 95.1 94.3  PLT 197 192 178    Iron/TIBC/Ferritin/ %Sat    Component Value Date/Time   IRON 27 (L) 04/29/2009 1530   TIBC 263 04/29/2009 1530   FERRITIN 26 04/29/2009 1530   IRONPCTSAT 10 (L) 04/29/2009 1530

## 2019-09-07 MED ORDER — POLYETHYLENE GLYCOL 3350 17 G PO PACK: 17.0000 g | PACK | Freq: Every day | ORAL | 0 refills | Status: DC

## 2019-09-07 MED ORDER — POLYETHYLENE GLYCOL 3350 17 G PO PACK
17.0000 g | PACK | Freq: Every day | ORAL | Status: DC
  Administered 2019-09-07: 17 g via ORAL
  Filled 2019-09-07: qty 1

## 2019-09-07 NOTE — Progress Notes (Signed)
Patient reported he had a large bowel movement overnight.

## 2019-09-07 NOTE — Discharge Summary (Signed)
[facility] Discharge Summary   Patient: Caleb Rivers MKL:[id number]   PCP: Patient, No Pcp Per DOB: 05/07/1956   Date of admission: 09/04/2019   Date of discharge: 09/07/2019     Discharge Diagnoses:  Principal Problem:   SBO (small bowel obstruction) (HCC) Active Problems:   Abnormal resting ECG findings - repolarization changes; NOT STEMI   Abdominal pain, generalized   Nausea and vomiting   History of peptic ulcer disease   Admitted From: Home Disposition:  Home   Recommendations for Outpatient Follow-up:  1. PCP: Follow-up in 1 week 2. Follow up LABS/TEST: None  Follow-up Information    PCP. Schedule an appointment as soon as possible for a visit in 1 week(s).          Diet recommendation: Cardiac diet  Activity: The patient is advised to gradually reintroduce usual activities,as tolerated .  Discharge Condition: good  Code Status: Full code   History of present illness: As per the H and P dictated on admission, "Caleb Rivers is a 63 y.o. male with history of peptic ulcer disease presents to the ER with complaints of abdominal pain with nausea vomiting.  Patient symptoms started yesterday morning.  Pain is diffuse all over the abdomen with multiple episodes of vomiting.  No blood in the vomitus but has noticed some blood-tinged bowel movement last few days.  Last bowel movement was yesterday which was a small quantity.  Due to persistent pain patient presents to the ER.  Insert [id number] patient was admitted for similar episode when no definite cause of the obstruction was known.  ED Course: In the ER patient had CT abdomen pelvis which shows features concerning for small bowel obstruction with no definite transition point seen.  Patient was placed on NG tube suction and admitted for further management.  Patient EKG does show some ST-T changes in the anterior leads and has had similar changes previously in 2014 when patient underwent cardiac cath and was showing  normal coronaries at that time.  Presently patient denies any chest pain.  Troponin is negative.  COVID-19 test is pending.  Labs reveal blood glucose of 148 WBC count 11.3. "  Hospital Course:  Summary of his active problems in the hospital is as following. Small bowel obstruction. Abdominal pain with nausea vomiting:  CT scan does not show any definite transition point.  Patient refused surgery consult.  NG tube was inserted on admission, but displaced Tolerating soft diet. X-ray shows evidence of bowel obstruction. No further work-up.  History of peptic ulcer disease:  per EGD done in 2010. Has had noticed some blood in the stools recently.  Hemoglobin stable.  No active bleeding.  Abnormal EKG-patient has had history of abnormal EKG also had undergone cardiac cath in 2014 which showed normal coronaries. Presently denies any chest pain and troponin initially was negative.  Patient was ambulatory without any assistance. On the day of the discharge the patient's vitals were stable, and no other acute medical condition were reported by patient. the patient was felt safe to be discharge at Home with no therapy needed on discharge.  Consultants: none Procedures: none  DISCHARGE MEDICATION: Allergies as of 09/07/2019      Reactions   Asa [aspirin] Other (See Comments)   Current and Hx of GI ulcer - per Pt and friend/translator   Nsaids Other (See Comments)   Current and Hx of GI ulcer per pt and friend/translator   Peanut-containing Drug Products    Pain.  Medication List    TAKE these medications   acetaminophen 500 MG tablet Commonly known as: TYLENOL Take 1,000 mg by mouth every 6 (six) hours as needed (pain).   docusate sodium 100 MG capsule Commonly known as: COLACE Take 100 mg by mouth daily as needed for mild constipation.   Maalox Max 240-973-53 MG/5ML suspension Generic drug: alum & mag hydroxide-simeth Take 10 mLs by mouth every 6 (six) hours as needed  for indigestion.   multivitamin with minerals Tabs tablet Take 1 tablet by mouth daily.   pantoprazole 20 MG tablet Commonly known as: PROTONIX Take 1 tablet (20 mg total) by mouth daily.   polyethylene glycol 17 g packet Commonly known as: MIRALAX / GLYCOLAX Take 17 g by mouth daily. Start taking on: September 08, 2019      Allergies  Allergen Reactions   Asa [Aspirin] Other (See Comments)    Current and Hx of GI ulcer - per Pt and friend/translator   Nsaids Other (See Comments)    Current and Hx of GI ulcer per pt and friend/translator   Peanut-Containing Drug Products     Pain.     Discharge Instructions    Diet - low sodium heart healthy   Complete by: As directed    Increase activity slowly   Complete by: As directed      Discharge Exam: Filed Weights   09/05/19 0012 09/05/19 0334  Weight: 108.9 kg 101.2 kg   Vitals:   09/06/19 1945 09/07/19 0442  BP: (!) 147/94 (!) 149/88  Pulse: 74 76  Resp: 18 16  Temp: 98.3 F (36.8 C) 99 F (37.2 C)  SpO2: 98% 100%   General: Appear in no distress, no Rash; Oral Mucosa Clear, moist. no Abnormal Mass Or lumps Cardiovascular: S1 and S2 Present, no Murmur, Respiratory: normal respiratory effort, Bilateral Air entry present and Clear to Auscultation, no Crackles, no wheezes Abdomen: Bowel Sound present, Soft and no tenderness, no hernia Extremities: no Pedal edema, no calf tenderness Neurology: alert and oriented to time, place, and person affect appropriate.  The results of significant diagnostics from this hospitalization (including imaging, microbiology, ancillary and laboratory) are listed below for reference.    Significant Diagnostic Studies: Dg Abdomen 1 View  Result Date: 09/05/2019 CLINICAL DATA:  NG tube placement EXAM: ABDOMEN - 1 VIEW COMPARISON:  None. FINDINGS: Tip the NG tube is seen projecting over the distal stomach. The bowel gas pattern is normal. No radio-opaque calculi or other significant  radiographic abnormality are seen. IMPRESSION: Tip of NG tube seen projecting over the distal stomach. Electronically Signed   By: Prudencio Pair M.D.   On: 09/05/2019 03:00   Ct Abdomen Pelvis W Contrast  Result Date: 09/05/2019 CLINICAL DATA:  Abdominal pain.  Prior small bowel obstruction. EXAM: CT ABDOMEN AND PELVIS WITH CONTRAST TECHNIQUE: Multidetector CT imaging of the abdomen and pelvis was performed using the standard protocol following bolus administration of intravenous contrast. CONTRAST:  13mL OMNIPAQUE IOHEXOL 300 MG/ML  SOLN COMPARISON:  08/30/2014 FINDINGS: Lower chest: Lung bases are clear. No effusions. Heart is normal size. Hepatobiliary: No focal hepatic abnormality. Gallbladder unremarkable. Pancreas: No focal abnormality or ductal dilatation. Spleen: No focal abnormality.  Normal size. Adrenals/Urinary Tract: No adrenal abnormality. No focal renal abnormality. No stones or hydronephrosis. Urinary bladder is unremarkable. Stomach/Bowel: Dilated small bowel loops centrally in the abdomen and pelvis. Proximal small bowel is decompressed. Findings concerning for distal small bowel obstruction. Exact transition not visualized. Stomach grossly unremarkable. Vascular/Lymphatic:  No evidence of aneurysm or adenopathy. Reproductive: No visible focal abnormality. Other: Small amount of free fluid adjacent to the liver. No free air. Musculoskeletal: No acute bony abnormality. IMPRESSION: Dilated small bowel loops concerning for small bowel obstruction. Exact transition not visualized. Small amount of perihepatic ascites. Electronically Signed   By: Charlett NoseKevin  Dover M.D.   On: 09/05/2019 01:51   Dg Abd 2 Views  Result Date: 09/06/2019 CLINICAL DATA:  Small bowel obstruction. EXAM: ABDOMEN - 2 VIEW COMPARISON:  Radiographs of September 05, 2019. FINDINGS: The bowel gas pattern is normal. There is no evidence of free air. No radio-opaque calculi or other significant radiographic abnormality is seen.  IMPRESSION: No evidence of bowel obstruction or ileus. Electronically Signed   By: Lupita RaiderJames  Green Jr M.D.   On: 09/06/2019 15:06    Microbiology: Recent Results (from the past 240 hour(s))  SARS CORONAVIRUS 2 (TAT 6-24 HRS) Nasopharyngeal Nasopharyngeal Swab     Status: None   Collection Time: 09/05/19  2:28 AM   Specimen: Nasopharyngeal Swab  Result Value Ref Range Status   SARS Coronavirus 2 NEGATIVE NEGATIVE Final    Comment: (NOTE) SARS-CoV-2 target nucleic acids are NOT DETECTED. The SARS-CoV-2 RNA is generally detectable in upper and lower respiratory specimens during the acute phase of infection. Negative results do not preclude SARS-CoV-2 infection, do not rule out co-infections with other pathogens, and should not be used as the sole basis for treatment or other patient management decisions. Negative results must be combined with clinical observations, patient history, and epidemiological information. The expected result is Negative. Fact Sheet for Patients: HairSlick.nohttps://www.fda.gov/media/138098/download Fact Sheet for Healthcare Providers: quierodirigir.comhttps://www.fda.gov/media/138095/download This test is not yet approved or cleared by the [place] FDA and  has been authorized for detection and/or diagnosis of SARS-CoV-2 by FDA under an Emergency Use Authorization (EUA). This EUA will remain  in effect (meaning this test can be used) for the duration of the COVID-19 declaration under Section 56 4(b)(1) of the Act, 21 U.S.C. section 360bbb-3(b)(1), unless the authorization is terminated or revoked sooner. Performed at [facility], [address] [place], [place] [address]      Labs: CBC: Recent Labs  Lab 09/04/19 2147 09/05/19 0823 09/06/19 0355  WBC 11.1* 11.7* 9.5  NEUTROABS  --  8.1*  --   HGB 16.8 16.0 15.7  HCT 50.2 48.8 48.3  MCV 93.1 95.1 94.3  PLT 197 192 178   Basic Metabolic Panel: Recent Labs  Lab 09/04/19 2147 09/05/19 0823 09/06/19 0355  NA 139  138 137  K 4.0 3.9 3.9  CL 106 104 107  CO2 21* 22 24  GLUCOSE 148* 111* 109*  BUN 19 16 15   CREATININE 1.02 1.05 1.09  CALCIUM 9.5 9.0 8.6*   Liver Function Tests: Recent Labs  Lab 09/04/19 2147 09/05/19 0823  AST 37 31  ALT 43 36  ALKPHOS 85 73  BILITOT 0.9 0.9  PROT 9.0* 7.9  ALBUMIN 4.5 3.9   Recent Labs  Lab 09/04/19 2147  LIPASE 26   No results for input(s): AMMONIA in the last 168 hours. Cardiac Enzymes: No results for input(s): CKTOTAL, CKMB, CKMBINDEX, TROPONINI in the last 168 hours. BNP (last 3 results) No results for input(s): BNP in the last 8760 hours. CBG: No results for input(s): GLUCAP in the last 168 hours.  Time spent: 35 minutes  Signed:  Lynden Oxfordranav Astraea Gaughran  [facility] 09/07/2019  6:58 PM

## 2019-09-07 NOTE — Progress Notes (Signed)
Discharge instructions reviewed with patient and interpreter [phone]. All questions answered. Patient calling son for transport home.

## 2020-07-14 ENCOUNTER — Emergency Department (HOSPITAL_COMMUNITY)
Admission: EM | Admit: 2020-07-14 | Discharge: 2020-07-14 | Disposition: A | Discharge status: Home / Self Care | Payer: No Typology Code available for payment source | Attending: Emergency Medicine | Admitting: Emergency Medicine

## 2020-07-14 ENCOUNTER — Other Ambulatory Visit: Payer: Self-pay

## 2020-07-14 ENCOUNTER — Emergency Department (HOSPITAL_COMMUNITY): Payer: No Typology Code available for payment source

## 2020-07-14 ENCOUNTER — Encounter (HOSPITAL_COMMUNITY): Payer: Self-pay | Admitting: Emergency Medicine

## 2020-07-14 DIAGNOSIS — Y929 Unspecified place or not applicable: Secondary | ICD-10-CM | POA: Insufficient documentation

## 2020-07-14 DIAGNOSIS — Y999 Unspecified external cause status: Secondary | ICD-10-CM | POA: Diagnosis not present

## 2020-07-14 DIAGNOSIS — Y939 Activity, unspecified: Secondary | ICD-10-CM | POA: Insufficient documentation

## 2020-07-14 DIAGNOSIS — M25471 Effusion, right ankle: Secondary | ICD-10-CM | POA: Insufficient documentation

## 2020-07-14 DIAGNOSIS — Z79899 Other long term (current) drug therapy: Secondary | ICD-10-CM | POA: Diagnosis not present

## 2020-07-14 DIAGNOSIS — M25571 Pain in right ankle and joints of right foot: Secondary | ICD-10-CM | POA: Insufficient documentation

## 2020-07-14 DIAGNOSIS — Z91010 Allergy to peanuts: Secondary | ICD-10-CM | POA: Diagnosis not present

## 2020-07-14 DIAGNOSIS — W19XXXA Unspecified fall, initial encounter: Secondary | ICD-10-CM | POA: Diagnosis not present

## 2020-07-14 NOTE — ED Triage Notes (Signed)
Pt. Stated, I fall, Wednesday hurt foot (right)

## 2020-07-14 NOTE — Discharge Instructions (Addendum)
Veuillez porter l'attelle au besoin. 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 Steele Sizer faire un suivi avec le mdecin orthopdiste indiqu dans vos documents de sortie. 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 Vous pouvez galement appliquer de la chaleur ou de la glace au besoin. Veuillez appeler le numro le lundi pour prendre [employer]. Retournez aux urgences si vos symptmes s'aggravent.

## 2020-07-14 NOTE — ED Provider Notes (Signed)
[facility] EMERGENCY DEPARTMENT Provider Note   CSN: [id number] Arrival date & time: 07/14/20  1306     History Chief Complaint  Patient presents with  . Ankle Pain  . Fall    Caleb Rivers is a 64 y.o. male.  HPI 64 year old male with a history of constipation and peptic ulcer disease presents to the ER with right ankle pain.  History provided by [place] interpreter.  Patient reports that he had a mechanical fall on Wednesday, and hurt his right foot.  He reports some swelling and pain to his right ankle.  He denies any numbness or tingling, fevers, chills, no history of IVDU.  No history of gout.  He has some pain with ambulation, but has been able to walk on it "no problem" as he reports.  He states that approximately 10 years ago, he had a similar fall and had similar pain, and was given a brace which has helped him significantly.  He does not have a primary care doctor, though is insured.  He has been taking Tylenol for pain but nothing else.  Denies any head injury or LOC with fall.  No chest pain, shortness of breath or dizziness.  Past Medical History:  Diagnosis Date  . Constipation   . Ulcer     Patient Active Problem List   Diagnosis Date Noted  . History of peptic ulcer disease 09/05/2019  . SBO (small bowel obstruction) (HCC) 08/30/2014  . Abdominal pain, generalized 08/30/2014  . Nausea and vomiting 08/30/2014  . Abnormal resting ECG findings - repolarization changes; NOT STEMI 09/26/2013  . Gastritis 09/25/2013    Past Surgical History:  Procedure Laterality Date  . LEFT HEART CATHETERIZATION WITH CORONARY ANGIOGRAM N/A 09/25/2013   Procedure: LEFT HEART CATHETERIZATION WITH CORONARY ANGIOGRAM;  Surgeon: Lennette Bihari, MD;  Location: [facility] CATH LAB;  Service: Cardiovascular;  Laterality: N/A;       Family History  Problem Relation Age of Onset  . Diabetes Mellitus II Neg Hx   . Cancer Neg Hx     Social History   Tobacco Use  .  Smoking status: Never Smoker  . Smokeless tobacco: Never Used  Substance Use Topics  . Alcohol use: No  . Drug use: No    Home Medications Prior to Admission medications   Medication Sig Start Date End Date Taking? Authorizing Provider  acetaminophen (TYLENOL) 500 MG tablet Take 1,000 mg by mouth every 6 (six) hours as needed (pain).    [provider]  alum & mag hydroxide-simeth (MAALOX MAX) 400-400-40 MG/5ML suspension Take 10 mLs by mouth every 6 (six) hours as needed for indigestion.    [provider]  docusate sodium (COLACE) 100 MG capsule Take 100 mg by mouth daily as needed for mild constipation.    [provider]  Multiple Vitamin (MULTIVITAMIN WITH MINERALS) TABS tablet Take 1 tablet by mouth daily.    [provider]  pantoprazole (PROTONIX) 20 MG tablet Take 1 tablet (20 mg total) by mouth daily. 04/25/17   Mabe, Latanya Maudlin, MD  polyethylene glycol (MIRALAX / GLYCOLAX) 17 g packet Take 17 g by mouth daily. 09/08/19   Rolly Salter, MD    Allergies    Asa [aspirin], Nsaids, and Peanut-containing drug products  Review of Systems   Review of Systems  Constitutional: Negative for chills and fever.  Musculoskeletal: Positive for arthralgias (Right ankle pain).  Skin: Negative for pallor and wound.  Neurological: Negative for weakness and  numbness.    Physical Exam Updated Vital Signs BP (!) 159/86 (BP Location: Left Arm)   Pulse 85   Temp 98.4 F (36.9 C) (Oral)   Resp 16   Ht 6\' 5"  (1.956 m)   BMI 26.46 kg/m   Physical Exam Vitals and nursing note reviewed.  Constitutional:      General: He is not in acute distress.    Appearance: He is well-developed. He is not ill-appearing.  HENT:     Head: Normocephalic and atraumatic.  Eyes:     Conjunctiva/sclera: Conjunctivae normal.  Cardiovascular:     Rate and Rhythm: Normal rate and regular rhythm.     Heart sounds: No murmur heard.   Pulmonary:     Effort: Pulmonary effort  is normal. No respiratory distress.     Breath sounds: Normal breath sounds.  Abdominal:     Palpations: Abdomen is soft.     Tenderness: There is no abdominal tenderness.  Musculoskeletal:        General: Swelling present. No tenderness.     Cervical back: Neck supple.     Comments: Right ankle with mild effusion.  No significant tenderness to palpation.  No overlying erythema, no warmth.  Full range of motion, though with slight pain.  Gross sensations intact.  2+ DP pulse.  Patient ambulated in front of me in the ER without difficulty.  Skin:    General: Skin is warm and dry.  Neurological:     General: No focal deficit present.     Mental Status: He is alert and oriented to person, place, and time.     Sensory: No sensory deficit.     Motor: No weakness.     ED Results / Procedures / Treatments   Labs (all labs ordered are listed, but only abnormal results are displayed) Labs Reviewed - No data to display  EKG None  Radiology DG Ankle Complete Right  Result Date: 07/14/2020 CLINICAL DATA:  Right ankle pain after fall yesterday. EXAM: RIGHT ANKLE - COMPLETE 3+ VIEW COMPARISON:  June 20, 2010. FINDINGS: There is no evidence of fracture, dislocation, or joint effusion. There is no evidence of arthropathy or other focal bone abnormality. Soft tissues are unremarkable. IMPRESSION: Negative. Electronically Signed   By: June 22[birth year] 2010 M.D.   On: 07/14/2020 15:07    Procedures Procedures (including critical care time)  Medications Ordered in ED Medications - No data to display  ED Course  I have reviewed the triage vital signs and the nursing notes.  Pertinent labs & imaging results that were available during my care of the patient were reviewed by me and considered in my medical decision making (see chart for details).    MDM Rules/Calculators/A&P                         64 year old male with ankle pain and mild right ankle effusion after fall on Wednesday.  No neuro  deficits, no evidence of gout, doubt septic joint.  No fevers or chills, no warmth, no erythema.  Range of motion intact.  I encouraged the patient to take Tylenol for pain, will provide ankle brace.  Will refer to Ortho for further follow-up.  No significant ankle laxity on exam, however he could potentially further benefit from physical therapy or further imaging.  Plain films reviewed here, without evidence of fracture.  Overall work-up reassuring.  Discussed return precautions.  The patient voices understanding and is agreeable to  this plan.  At this stage in the ED course, the patient has been medically screened and is stable for discharge.  Final Clinical Impression(s) / ED Diagnoses Final diagnoses:  Acute right ankle pain    Rx / DC Orders ED Discharge Orders    None       Mare Ferrari, PA-C 07/14/20 1601    Rolan Bucco, MD 07/15/20 [phone]

## 2020-07-14 NOTE — ED Notes (Signed)
Discharge paperwork reviewed with patient. No further questions for ED staff at this time.

## 2020-07-14 NOTE — Progress Notes (Signed)
Orthopedic Tech Progress Note Patient Details:  Caleb Rivers 09-28-1956 [id number]  Ortho Devices Type of Ortho Device: ASO Ortho Device/Splint Location: Lower Right Extremity Ortho Device/Splint Interventions: Ordered, Application, Adjustment   Post Interventions Patient Tolerated: Well Instructions Provided: Adjustment of device, Care of device, Poper ambulation with device   Jonathon Tan P Harle Stanford 07/14/2020, 4:07 PM

## 2020-09-19 ENCOUNTER — Encounter (HOSPITAL_COMMUNITY): Payer: Self-pay | Admitting: Emergency Medicine

## 2020-09-19 ENCOUNTER — Emergency Department (HOSPITAL_COMMUNITY)
Admission: EM | Admit: 2020-09-19 | Discharge: 2020-09-19 | Disposition: A | Discharge status: Home / Self Care | Payer: No Typology Code available for payment source | Attending: Emergency Medicine | Admitting: Emergency Medicine

## 2020-09-19 ENCOUNTER — Other Ambulatory Visit: Payer: Self-pay

## 2020-09-19 DIAGNOSIS — K625 Hemorrhage of anus and rectum: Secondary | ICD-10-CM | POA: Insufficient documentation

## 2020-09-19 DIAGNOSIS — Z91010 Allergy to peanuts: Secondary | ICD-10-CM | POA: Diagnosis not present

## 2020-09-19 DIAGNOSIS — Z20822 Contact with and (suspected) exposure to covid-19: Secondary | ICD-10-CM | POA: Insufficient documentation

## 2020-09-19 DIAGNOSIS — Z8711 Personal history of peptic ulcer disease: Secondary | ICD-10-CM | POA: Insufficient documentation

## 2020-09-19 DIAGNOSIS — K921 Melena: Secondary | ICD-10-CM | POA: Diagnosis present

## 2020-09-19 HISTORY — DX: Gastric ulcer, unspecified as acute or chronic, without hemorrhage or perforation: K25.9

## 2020-09-19 LAB — CBC WITH DIFFERENTIAL/PLATELET
Abs Immature Granulocytes: 0.04 10*3/uL (ref 0.00–0.07)
Basophils Absolute: 0 10*3/uL (ref 0.0–0.1)
Basophils Relative: 1 %
Eosinophils Absolute: 0.1 10*3/uL (ref 0.0–0.5)
Eosinophils Relative: 1 %
HCT: 50.9 % (ref 39.0–52.0)
Hemoglobin: 17.1 g/dL — ABNORMAL HIGH (ref 13.0–17.0)
Immature Granulocytes: 1 %
Lymphocytes Relative: 30 %
Lymphs Abs: 2.1 10*3/uL (ref 0.7–4.0)
MCH: 31.3 pg (ref 26.0–34.0)
MCHC: 33.6 g/dL (ref 30.0–36.0)
MCV: 93.1 fL (ref 80.0–100.0)
Monocytes Absolute: 0.7 10*3/uL (ref 0.1–1.0)
Monocytes Relative: 10 %
Neutro Abs: 4.1 10*3/uL (ref 1.7–7.7)
Neutrophils Relative %: 57 %
Platelets: 219 10*3/uL (ref 150–400)
RBC: 5.47 MIL/uL (ref 4.22–5.81)
RDW: 14.1 % (ref 11.5–15.5)
WBC: 7 10*3/uL (ref 4.0–10.5)
nRBC: 0 % (ref 0.0–0.2)

## 2020-09-19 LAB — POC OCCULT BLOOD, ED: Fecal Occult Bld: NEGATIVE

## 2020-09-19 LAB — COMPREHENSIVE METABOLIC PANEL
ALT: 43 U/L (ref 0–44)
AST: 42 U/L — ABNORMAL HIGH (ref 15–41)
Albumin: 4.5 g/dL (ref 3.5–5.0)
Alkaline Phosphatase: 68 U/L (ref 38–126)
Anion gap: 10 (ref 5–15)
BUN: 14 mg/dL (ref 8–23)
CO2: 22 mmol/L (ref 22–32)
Calcium: 9.2 mg/dL (ref 8.9–10.3)
Chloride: 102 mmol/L (ref 98–111)
Creatinine, Ser: 1.22 mg/dL (ref 0.61–1.24)
GFR calc Af Amer: >60 mL/min (ref >60)
GFR calc non Af Amer: >60 mL/min (ref >60)
Glucose, Bld: 136 mg/dL — ABNORMAL HIGH (ref 70–99)
Potassium: 4.2 mmol/L (ref 3.5–5.1)
Sodium: 134 mmol/L — ABNORMAL LOW (ref 135–145)
Total Bilirubin: 0.8 mg/dL (ref 0.3–1.2)
Total Protein: 8.5 g/dL — ABNORMAL HIGH (ref 6.5–8.1)

## 2020-09-19 LAB — URINALYSIS, ROUTINE W REFLEX MICROSCOPIC
Bilirubin Urine: NEGATIVE
Glucose, UA: NEGATIVE mg/dL
Hgb urine dipstick: NEGATIVE
Ketones, ur: NEGATIVE mg/dL
Leukocytes,Ua: NEGATIVE
Nitrite: NEGATIVE
Protein, ur: NEGATIVE mg/dL
Specific Gravity, Urine: 1.005 (ref 1.005–1.030)
pH: 7 (ref 5.0–8.0)

## 2020-09-19 LAB — TYPE AND SCREEN
ABO/RH(D): O POS
Antibody Screen: NEGATIVE

## 2020-09-19 LAB — SARS CORONAVIRUS 2 BY RT PCR (HOSPITAL ORDER, PERFORMED IN ~~LOC~~ HOSPITAL LAB): SARS Coronavirus 2: NEGATIVE

## 2020-09-19 MED ORDER — PANTOPRAZOLE SODIUM 20 MG PO TBEC: 20.0000 mg | DELAYED_RELEASE_TABLET | Freq: Every day | ORAL | 0 refills | Status: DC

## 2020-09-19 NOTE — Discharge Instructions (Signed)
Your blood work today was all normal.  The test did not show any blood in your stool.  You are not anemic at this time.  You do not need a blood transfusion.  Prescription sent to your pharmacy for Protonix.  Take daily.  Follow-up with a regular doctor.  If you do not have 1 you can go in your insurance website and find a primary care provider that will accept your insurance.  Return to the emergency department for any new or worsening symptoms including weakness, fever, chills, abdominal pain, blood in stool.

## 2020-09-19 NOTE — ED Triage Notes (Signed)
Patient complains of black tarry in stool, states he has a stomach ulcer. States this episode started 5 days ago, reports about 5 BM's per day. Denies pain, fever, blood in urine. Denies alcohol use.

## 2020-09-19 NOTE — ED Provider Notes (Signed)
[facility]-EMERGENCY DEPT Provider Note   CSN: [id number] Arrival date & time: 09/19/20  4008     History Chief Complaint  Patient presents with  . Blood In Stools    Caleb Rivers is a 64 y.o. male with past medical history significant for peptic ulcer disease, SBO, gastritis.  Not anticoagulated. No history of abdominal surgeries. Had covid vaccinations.  HPI Patient presents to emergency department today with chief complaint of blood in stool x 5 days.  Patient states he noticed his bowel movements were dark and black in color.  He has been having approximately 3 bowel movements per day.  He denies being in any pain.  No medications for symptoms prior to arrival.  He states he has a history of the same and had to receive a blood transfusion.  He denies any alcohol or NSAID use. Denies fatigue, weakness, fever, chills, chest pain, abdominal pain, back pain, dysuria, gross hematuria, lower extremity edema, rash.  Due to language barrier, a phone interpreter was present during the history-taking and subsequent discussion (and for part of the physical exam) with this patient.     Past Medical History:  Diagnosis Date  . Constipation   . Gastric ulcer   . Ulcer     Patient Active Problem List   Diagnosis Date Noted  . History of peptic ulcer disease 09/05/2019  . SBO (small bowel obstruction) (HCC) 08/30/2014  . Abdominal pain, generalized 08/30/2014  . Nausea and vomiting 08/30/2014  . Abnormal resting ECG findings - repolarization changes; NOT STEMI 09/26/2013  . Gastritis 09/25/2013    Past Surgical History:  Procedure Laterality Date  . LEFT HEART CATHETERIZATION WITH CORONARY ANGIOGRAM N/A 09/25/2013   Procedure: LEFT HEART CATHETERIZATION WITH CORONARY ANGIOGRAM;  Surgeon: Lennette Bihari, MD;  Location: [facility] CATH LAB;  Service: Cardiovascular;  Laterality: N/A;       Family History  Problem Relation Age of Onset  . Diabetes Mellitus II Neg Hx    . Cancer Neg Hx     Social History   Tobacco Use  . Smoking status: Never Smoker  . Smokeless tobacco: Never Used  Substance Use Topics  . Alcohol use: No  . Drug use: No    Home Medications Prior to Admission medications   Medication Sig Start Date End Date Taking? Authorizing Provider  acetaminophen (TYLENOL) 500 MG tablet Take 1,000 mg by mouth every 6 (six) hours as needed (pain).   Yes [provider]  alum & mag hydroxide-simeth (MAALOX MAX) 400-400-40 MG/5ML suspension Take 10 mLs by mouth every 6 (six) hours as needed for indigestion.   Yes [provider]  docusate sodium (COLACE) 100 MG capsule Take 100 mg by mouth daily as needed for mild constipation.   Yes [provider]  ibuprofen (ADVIL) 200 MG tablet Take 200-1,000 mg by mouth every 6 (six) hours as needed for moderate pain.   Yes [provider]  Multiple Vitamin (MULTIVITAMIN WITH MINERALS) TABS tablet Take 1 tablet by mouth daily.   Yes [provider]  pantoprazole (PROTONIX) 20 MG tablet Take 1 tablet (20 mg total) by mouth daily. 09/19/20   Faaris Arizpe E, PA-C  polyethylene glycol (MIRALAX / GLYCOLAX) 17 g packet Take 17 g by mouth daily. Patient not taking: Reported on 09/19/2020 09/08/19   Rolly Salter, MD    Allergies    Asa [aspirin], Nsaids, and Peanut-containing drug products  Review of Systems   Review of Systems All other  systems are reviewed and are negative for acute change except as noted in the HPI.  Physical Exam Updated Vital Signs BP 140/85 (BP Location: Left Arm)   Pulse 79   Temp 98.6 F (37 C) (Oral)   Resp 18   Ht 6\' 5"  (1.956 m)   SpO2 99%   BMI 26.46 kg/m   Physical Exam Vitals and nursing note reviewed.  Constitutional:      General: He is not in acute distress.    Appearance: He is not ill-appearing.  HENT:     Head: Normocephalic and atraumatic.     Right Ear: Tympanic membrane and external ear normal.     Left Ear:  Tympanic membrane and external ear normal.     Nose: Nose normal.     Mouth/Throat:     Mouth: Mucous membranes are moist.     Pharynx: Oropharynx is clear.  Eyes:     General: No scleral icterus.       Right eye: No discharge.        Left eye: No discharge.     Extraocular Movements: Extraocular movements intact.     Conjunctiva/sclera: Conjunctivae normal.     Pupils: Pupils are equal, round, and reactive to light.  Neck:     Vascular: No JVD.  Cardiovascular:     Rate and Rhythm: Normal rate and regular rhythm.     Pulses: Normal pulses.          Radial pulses are 2+ on the right side and 2+ on the left side.     Heart sounds: Normal heart sounds.  Pulmonary:     Comments: Lungs clear to auscultation in all fields. Symmetric chest rise. No wheezing, rales, or rhonchi. Abdominal:     Comments: Abdomen is soft, non-distended, and non-tender in all quadrants. No rigidity, no guarding. No peritoneal signs.  Genitourinary:    Comments: Chaperone RN present for exam. Digital Rectal Exam reveals sphincter with good tone. No external hemorrhoids. No masses or fissures. Stool color is brown with no overt blood. No gross melena.  Musculoskeletal:        General: Normal range of motion.     Cervical back: Normal range of motion.  Skin:    General: Skin is warm and dry.     Capillary Refill: Capillary refill takes less than 2 seconds.  Neurological:     Mental Status: He is oriented to person, place, and time.     GCS: GCS eye subscore is 4. GCS verbal subscore is 5. GCS motor subscore is 6.     Comments: Fluent speech, no facial droop.  Psychiatric:        Behavior: Behavior normal.     ED Results / Procedures / Treatments   Labs (all labs ordered are listed, but only abnormal results are displayed) Labs Reviewed  COMPREHENSIVE METABOLIC PANEL - Abnormal; Notable for the following components:      Result Value   Sodium 134 (*)    Glucose, Bld 136 (*)    Total Protein  8.5 (*)    AST 42 (*)    All other components within normal limits  CBC WITH DIFFERENTIAL/PLATELET - Abnormal; Notable for the following components:   Hemoglobin 17.1 (*)    All other components within normal limits  URINALYSIS, ROUTINE W REFLEX MICROSCOPIC - Abnormal; Notable for the following components:   Color, Urine STRAW (*)    All other components within normal limits  SARS CORONAVIRUS 2  BY RT PCR (HOSPITAL ORDER, PERFORMED IN [facility])  POC OCCULT BLOOD, ED  TYPE AND SCREEN    EKG None  Radiology No results found.  Procedures Procedures (including critical care time)  Medications Ordered in ED Medications - No data to display  ED Course  I have reviewed the triage vital signs and the nursing notes.  Pertinent labs & imaging results that were available during my care of the patient were reviewed by me and considered in my medical decision making (see chart for details).    MDM Rules/Calculators/A&P                          History provided by patient with additional history obtained from chart review.   64 year old male presenting with concern for blood in stool. Patient presents awake, alert, hemodynamically stable, afebrile, non toxic.  He is very well-appearing.  On exam he has no abdominal tenderness.  Rectal exam with out gross melena.  Fecal occult was negative. Labs are overall unremarkable including CBC, CMP.  Covid test is negative.  Urine negative for signs of infection.  Serial abdominal exams have been benign.  Given his reassuring work-up and exam today will discharge home.  Refill sent to pharmacy for Protonix.    The patient appears reasonably screened and/or stabilized for discharge and I doubt any other medical condition or other [facility] requiring further screening, evaluation, or treatment in the ED at this time prior to discharge. The patient is safe for discharge with strict return precautions discussed. Recommend pcp follow up for  symptom recheck.    Portions of this note were generated with Scientist, clinical (histocompatibility and immunogenetics). Dictation errors may occur despite best attempts at proofreading.     Final Clinical Impression(s) / ED Diagnoses Final diagnoses:  Rectal bleeding    Rx / DC Orders ED Discharge Orders         Ordered    pantoprazole (PROTONIX) 20 MG tablet  Daily        09/19/20 0940           Sherene Sires, PA-C 09/19/20 0944    Little, Ambrose Finland, MD 09/19/20 1118

## 2020-10-25 IMAGING — DX DG ABDOMEN 1V
1 series · 1 of 1 positions shown · non-contrast
Comparison: None.

CLINICAL DATA: NG tube placement

EXAM:
ABDOMEN - 1 VIEW

[abdomen kub]
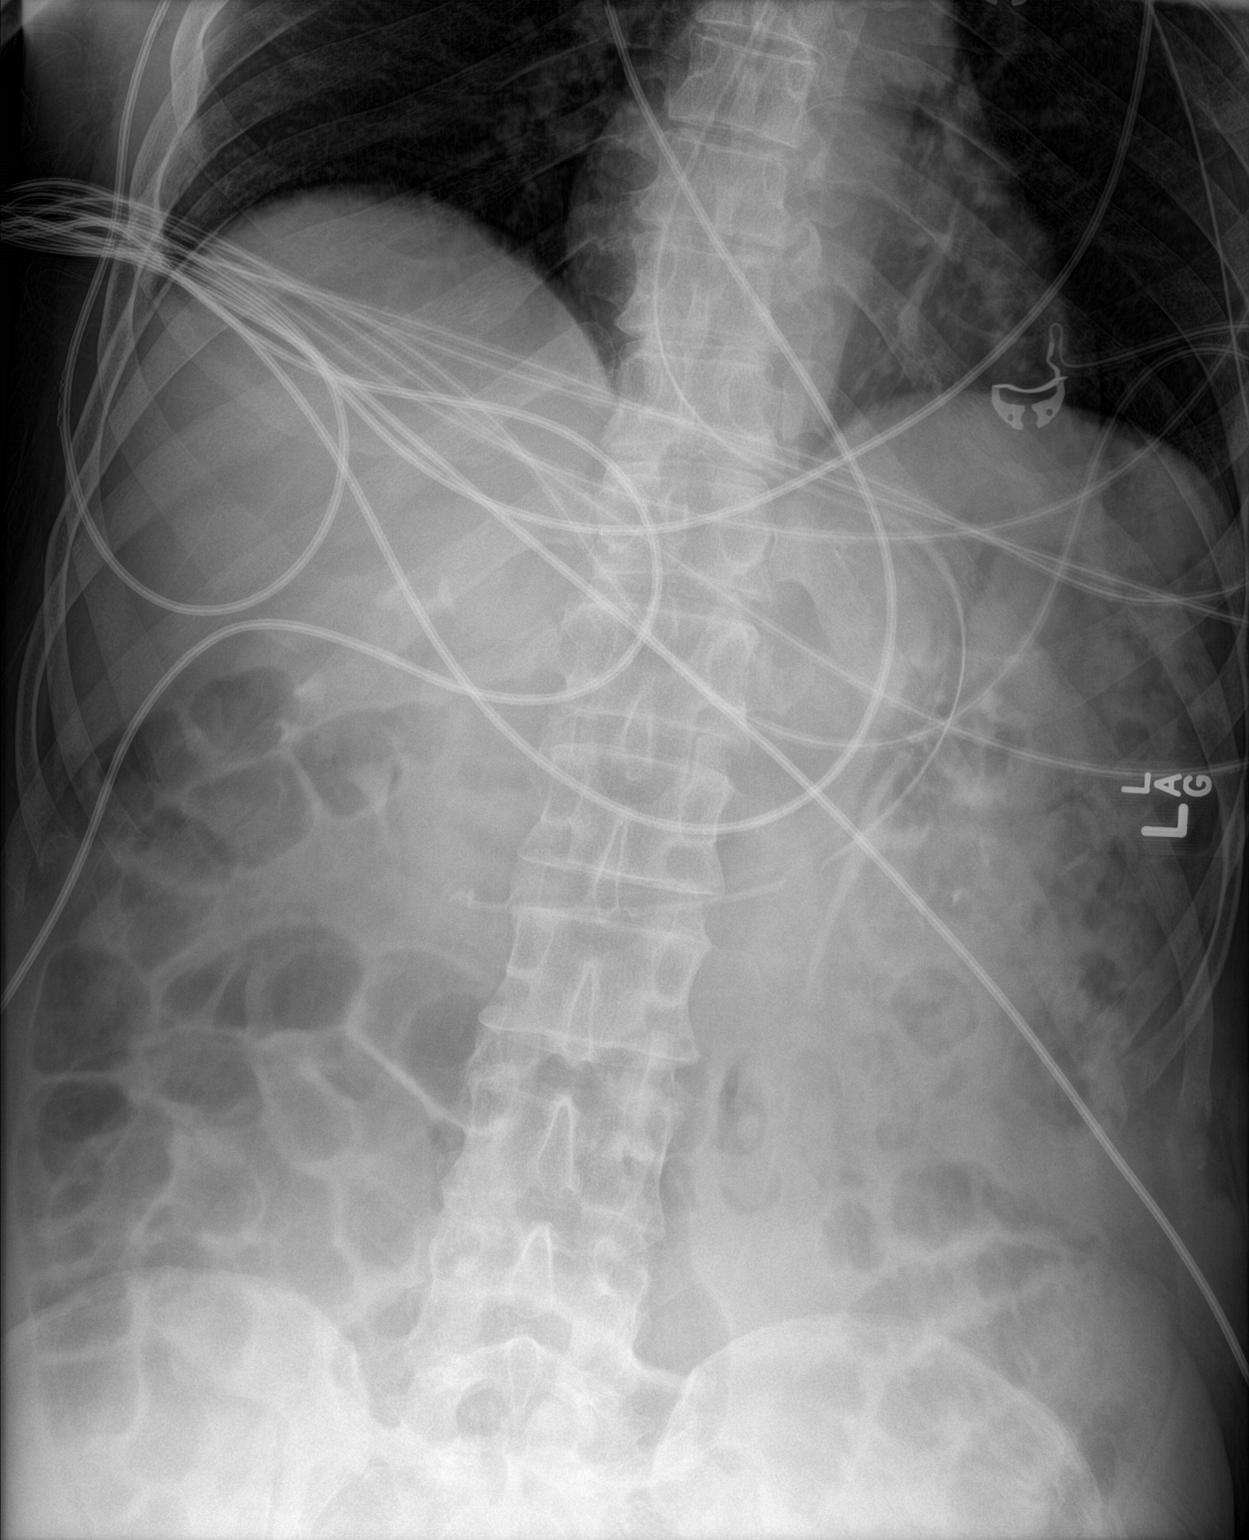

[1 of 1 positions shown; findings below may reference images not displayed]

FINDINGS: Tip the NG tube is seen projecting over the distal stomach. The
bowel gas pattern is normal. No radio-opaque calculi or other
significant radiographic abnormality are seen.
IMPRESSION: Tip of NG tube seen projecting over the distal stomach.

## 2020-10-26 IMAGING — DX DG ABDOMEN 2V
3 series · 3 of 3 positions shown · non-contrast
Comparison: Radiographs September 05, 2019.

CLINICAL DATA: Small bowel obstruction.

EXAM:
ABDOMEN - 2 VIEW

[abdomen erect]
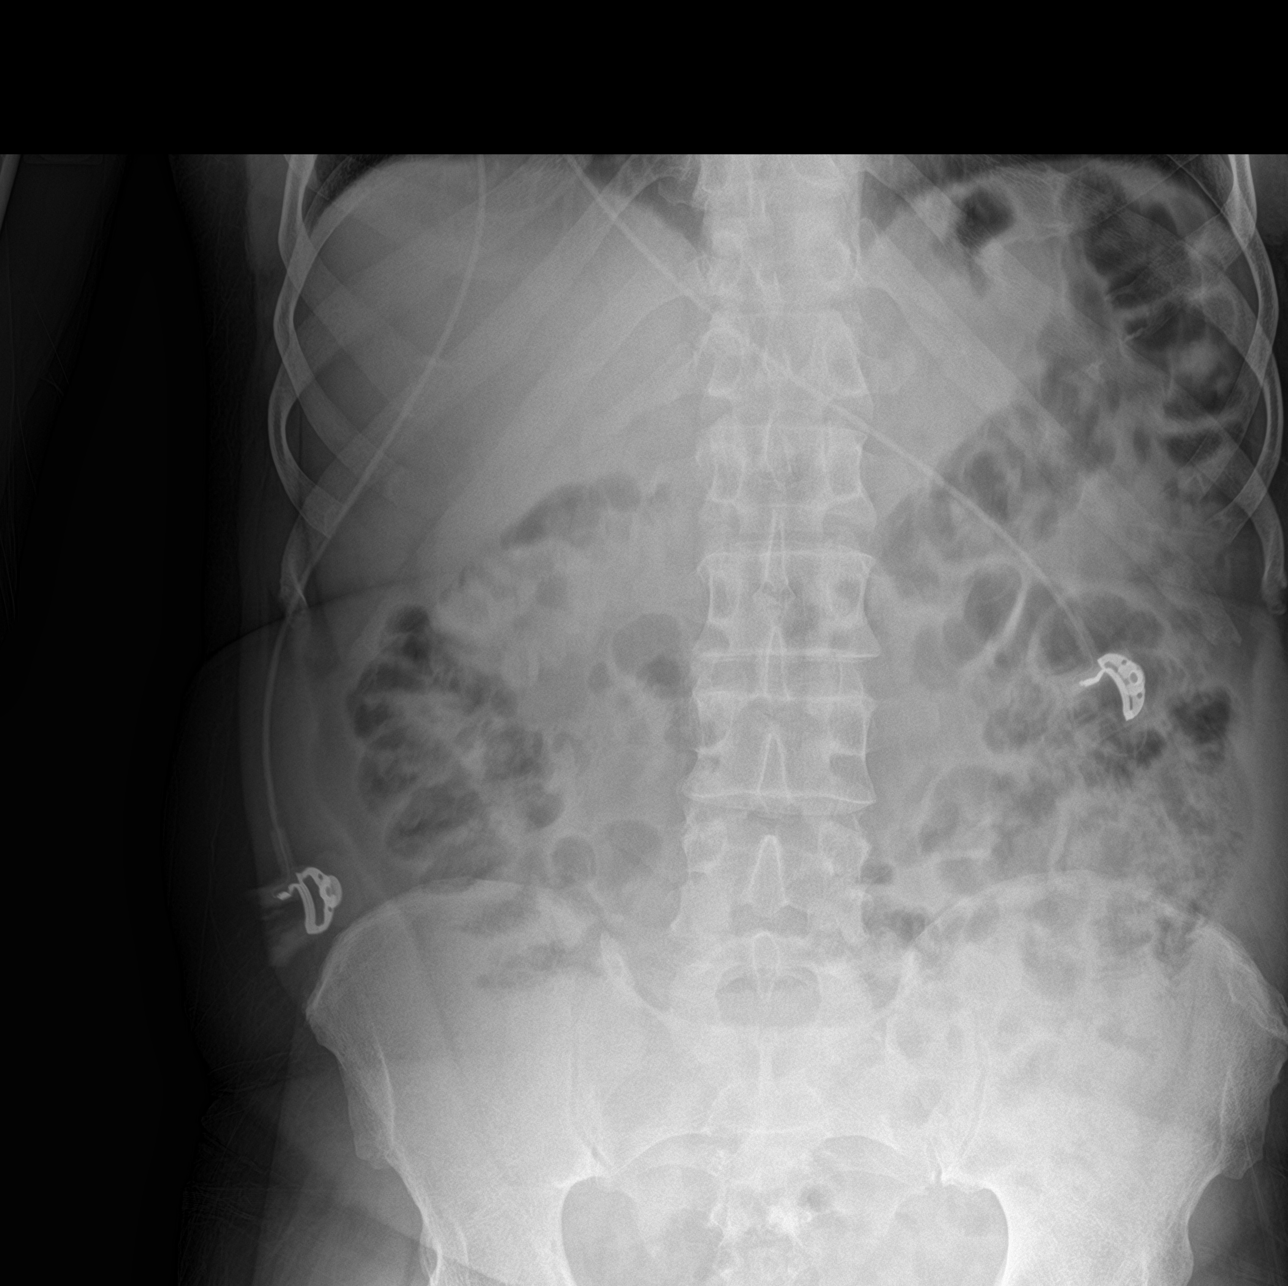

[abdomen supine (1 of 2)]
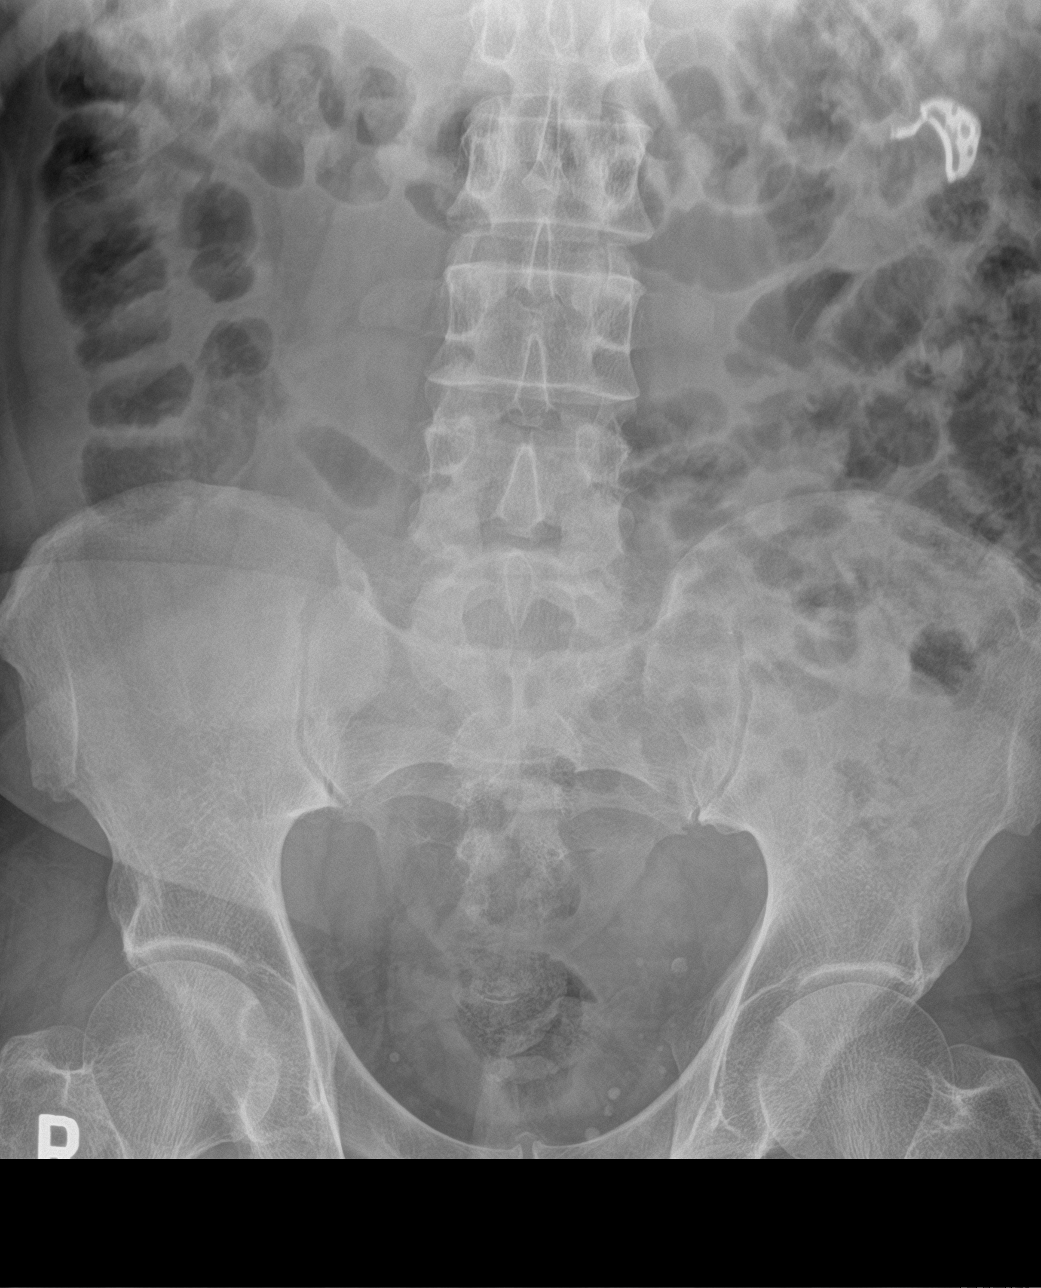

[abdomen supine (2 of 2)]
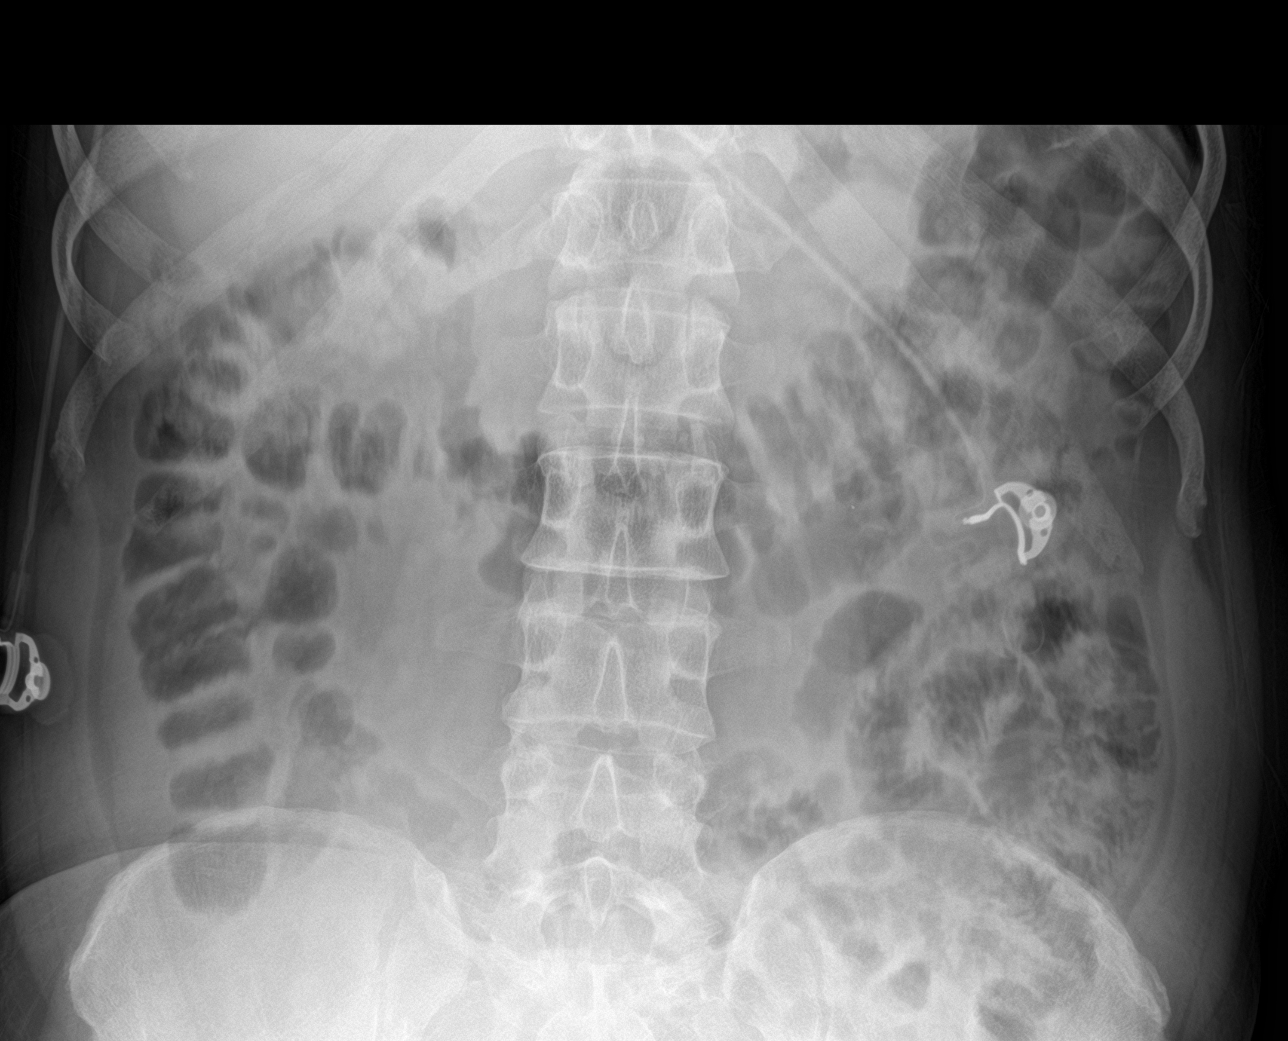

[3 of 3 positions shown; findings below may reference images not displayed]

FINDINGS: The bowel gas pattern is normal. There is no evidence of free air.
No radio-opaque calculi or other significant radiographic
abnormality is seen.
IMPRESSION: No evidence of bowel obstruction or ileus.

## 2020-11-09 ENCOUNTER — Emergency Department (HOSPITAL_COMMUNITY)
Admission: EM | Admit: 2020-11-09 | Discharge: 2020-11-09 | Disposition: A | Discharge status: Home / Self Care | Payer: No Typology Code available for payment source | Attending: Emergency Medicine | Admitting: Emergency Medicine

## 2020-11-09 ENCOUNTER — Emergency Department (HOSPITAL_COMMUNITY): Payer: No Typology Code available for payment source

## 2020-11-09 ENCOUNTER — Other Ambulatory Visit: Payer: Self-pay

## 2020-11-09 ENCOUNTER — Encounter (HOSPITAL_COMMUNITY): Payer: Self-pay | Admitting: Emergency Medicine

## 2020-11-09 DIAGNOSIS — K279 Peptic ulcer, site unspecified, unspecified as acute or chronic, without hemorrhage or perforation: Secondary | ICD-10-CM | POA: Insufficient documentation

## 2020-11-09 DIAGNOSIS — R1013 Epigastric pain: Secondary | ICD-10-CM | POA: Insufficient documentation

## 2020-11-09 DIAGNOSIS — Z91010 Allergy to peanuts: Secondary | ICD-10-CM | POA: Insufficient documentation

## 2020-11-09 LAB — URINALYSIS, ROUTINE W REFLEX MICROSCOPIC
Bilirubin Urine: NEGATIVE
Glucose, UA: NEGATIVE mg/dL
Hgb urine dipstick: NEGATIVE
Ketones, ur: NEGATIVE mg/dL
Leukocytes,Ua: NEGATIVE
Nitrite: NEGATIVE
Protein, ur: NEGATIVE mg/dL
Specific Gravity, Urine: 1.017 (ref 1.005–1.030)
pH: 5 (ref 5.0–8.0)

## 2020-11-09 LAB — CBC
HCT: 47.9 % (ref 39.0–52.0)
Hemoglobin: 16 g/dL (ref 13.0–17.0)
MCH: 31.1 pg (ref 26.0–34.0)
MCHC: 33.4 g/dL (ref 30.0–36.0)
MCV: 93.2 fL (ref 80.0–100.0)
Platelets: 220 10*3/uL (ref 150–400)
RBC: 5.14 MIL/uL (ref 4.22–5.81)
RDW: 14 % (ref 11.5–15.5)
WBC: 7.9 10*3/uL (ref 4.0–10.5)
nRBC: 0 % (ref 0.0–0.2)

## 2020-11-09 LAB — COMPREHENSIVE METABOLIC PANEL
ALT: 34 U/L (ref 0–44)
AST: 40 U/L (ref 15–41)
Albumin: 4.1 g/dL (ref 3.5–5.0)
Alkaline Phosphatase: 56 U/L (ref 38–126)
Anion gap: 12 (ref 5–15)
BUN: 12 mg/dL (ref 8–23)
CO2: 22 mmol/L (ref 22–32)
Calcium: 9.4 mg/dL (ref 8.9–10.3)
Chloride: 102 mmol/L (ref 98–111)
Creatinine, Ser: 1.23 mg/dL (ref 0.61–1.24)
GFR, Estimated: >60 mL/min (ref >60)
Glucose, Bld: 107 mg/dL — ABNORMAL HIGH (ref 70–99)
Potassium: 4.2 mmol/L (ref 3.5–5.1)
Sodium: 136 mmol/L (ref 135–145)
Total Bilirubin: 0.8 mg/dL (ref 0.3–1.2)
Total Protein: 8.2 g/dL — ABNORMAL HIGH (ref 6.5–8.1)

## 2020-11-09 LAB — LIPASE, BLOOD: Lipase: 30 U/L (ref 11–51)

## 2020-11-09 MED ORDER — LIDOCAINE VISCOUS HCL 2 % MT SOLN
15.0000 mL | Freq: Once | OROMUCOSAL | Status: AC
  Administered 2020-11-09: 15 mL via ORAL
  Filled 2020-11-09: qty 15

## 2020-11-09 MED ORDER — ALUM & MAG HYDROXIDE-SIMETH 200-200-20 MG/5ML PO SUSP
30.0000 mL | Freq: Once | ORAL | Status: AC
  Administered 2020-11-09: 30 mL via ORAL
  Filled 2020-11-09: qty 30

## 2020-11-09 MED ORDER — PANTOPRAZOLE SODIUM 40 MG PO TBEC: 40.0000 mg | DELAYED_RELEASE_TABLET | Freq: Every day | ORAL | 0 refills | Status: DC

## 2020-11-09 NOTE — ED Triage Notes (Addendum)
Complains of abdominal pain x1 week, radiates to his back, endorses nausea and gas. Endorses intermittent pain that gets worse w/ eating. States he had medication for a gastric ulcer but it ran out about 1 week ago, needs a new prescription.   Unable to obtain a [place] interpretor.

## 2020-11-09 NOTE — Discharge Instructions (Addendum)
Prenez Protonix [employer]. Il est important d'effectuer un suivi en soins primaires et en gastro-entrologie. Rfrence donne pour la gastro-entrologie et les soins primaires, veuillez appeler et prendre [employer].  Take Protonix daily as prescribed. It is important to follow up with primary care and with gastroenterology. Referral given for Gastroenterology and primary care, please call and schedule an appointment.

## 2020-11-09 NOTE — ED Provider Notes (Signed)
[facility]-EMERGENCY DEPT Provider Note   CSN: [id number] Arrival date & time: 11/09/20  4270     History No chief complaint on file.   Caleb Rivers is a 64 y.o. male.  64 year old male with history of gastric ulcer presents with complaint of epigastric abdominal pain with reflux, out of his PPI. Patient is unsure what he is prescribed, states he has been unable to find an in-network PCP to continue his care and comes to the ER for his medication when he has pain. Denies chest pain, shortness of breath. States he has blood in his stools from time to time, last incident was 3 weeks ago. Denies vomiting, reports severe reflux. No other complaints or concerns.   The history is limited by a language barrier. A language interpreter was used [place]).       Past Medical History:  Diagnosis Date  . Constipation   . Gastric ulcer   . Ulcer     Patient Active Problem List   Diagnosis Date Noted  . History of peptic ulcer disease 09/05/2019  . SBO (small bowel obstruction) (HCC) 08/30/2014  . Abdominal pain, generalized 08/30/2014  . Nausea and vomiting 08/30/2014  . Abnormal resting ECG findings - repolarization changes; NOT STEMI 09/26/2013  . Gastritis 09/25/2013    Past Surgical History:  Procedure Laterality Date  . LEFT HEART CATHETERIZATION WITH CORONARY ANGIOGRAM N/A 09/25/2013   Procedure: LEFT HEART CATHETERIZATION WITH CORONARY ANGIOGRAM;  Surgeon: Lennette Bihari, MD;  Location: [facility] CATH LAB;  Service: Cardiovascular;  Laterality: N/A;       Family History  Problem Relation Age of Onset  . Diabetes Mellitus II Neg Hx   . Cancer Neg Hx     Social History   Tobacco Use  . Smoking status: Never Smoker  . Smokeless tobacco: Never Used  Substance Use Topics  . Alcohol use: No  . Drug use: No    Home Medications Prior to Admission medications   Medication Sig Start Date End Date Taking? Authorizing Provider  acetaminophen (TYLENOL)  500 MG tablet Take 1,000 mg by mouth every 6 (six) hours as needed (pain).    [provider]  alum & mag hydroxide-simeth (MAALOX MAX) 400-400-40 MG/5ML suspension Take 10 mLs by mouth every 6 (six) hours as needed for indigestion.    [provider]  docusate sodium (COLACE) 100 MG capsule Take 100 mg by mouth daily as needed for mild constipation.    [provider]  ibuprofen (ADVIL) 200 MG tablet Take 200-1,000 mg by mouth every 6 (six) hours as needed for moderate pain.    [provider]  Multiple Vitamin (MULTIVITAMIN WITH MINERALS) TABS tablet Take 1 tablet by mouth daily.    [provider]  pantoprazole (PROTONIX) 40 MG tablet Take 1 tablet (40 mg total) by mouth daily. 11/09/20 12/09/20  [place] Melia A, PA-C  polyethylene glycol (MIRALAX / GLYCOLAX) 17 g packet Take 17 g by mouth daily. Patient not taking: Reported on 09/19/2020 09/08/19   Rolly Salter, MD    Allergies    Asa [aspirin], Nsaids, and Peanut-containing drug products  Review of Systems   Review of Systems  Constitutional: Negative for fever.  Respiratory: Negative for shortness of breath.   Cardiovascular: Negative for chest pain.  Gastrointestinal: Positive for abdominal pain and nausea. Negative for blood in stool, constipation, diarrhea and vomiting.  Genitourinary: Negative for dysuria and frequency.  Musculoskeletal: Positive for back pain.  Skin:  Negative for rash and wound.  Allergic/Immunologic: Negative for immunocompromised state.  Neurological: Negative for weakness.  All other systems reviewed and are negative.   Physical Exam Updated Vital Signs BP (!) 142/86   Pulse 79   Temp 98 F (36.7 C)   Resp 16   Ht 6\' 5"  (1.956 m)   Wt 102 kg   SpO2 100%   BMI 26.67 kg/m   Physical Exam Vitals and nursing note reviewed.  Constitutional:      General: He is not in acute distress.    Appearance: He is well-developed. He is not diaphoretic.  HENT:      Head: Normocephalic and atraumatic.  Eyes:     Conjunctiva/sclera: Conjunctivae normal.  Cardiovascular:     Rate and Rhythm: Normal rate and regular rhythm.     Pulses: Normal pulses.     Heart sounds: Normal heart sounds.  Pulmonary:     Effort: Pulmonary effort is normal.     Breath sounds: Normal breath sounds.  Abdominal:     Palpations: Abdomen is soft.     Tenderness: There is no abdominal tenderness. There is no right CVA tenderness or left CVA tenderness.  Musculoskeletal:        General: Tenderness present.       Back:     Right lower leg: No edema.     Left lower leg: No edema.  Skin:    General: Skin is warm and dry.     Findings: No erythema or rash.  Neurological:     Mental Status: He is alert and oriented to person, place, and time.  Psychiatric:        Behavior: Behavior normal.     ED Results / Procedures / Treatments   Labs (all labs ordered are listed, but only abnormal results are displayed) Labs Reviewed  COMPREHENSIVE METABOLIC PANEL - Abnormal; Notable for the following components:      Result Value   Glucose, Bld 107 (*)    Total Protein 8.2 (*)    All other components within normal limits  LIPASE, BLOOD  CBC  URINALYSIS, ROUTINE W REFLEX MICROSCOPIC    EKG None  Radiology DG Abd Acute W/Chest  Result Date: 11/09/2020 CLINICAL DATA:  Abdominal pain over the last week radiating to the back. Nausea. Gas. Gastric ulcer. EXAM: DG ABDOMEN ACUTE WITH 1 VIEW CHEST COMPARISON:  09/06/2019 FINDINGS: Heart size is normal. Mediastinal shadows are normal. The lungs are clear. No effusions. Moderate amount of intestinal gas but without dilated small or large bowel. Nonspecific pattern. No free air. No abnormal calcifications or significant bone findings. IMPRESSION: No active cardiopulmonary disease. Nonspecific bowel gas pattern. Moderate amount of intestinal gas but without evidence of ileus, obstruction or free air. Electronically Signed   By: 11/06/2019 M.D.   On: 11/09/2020 11:39    Procedures Procedures (including critical care time)  Medications Ordered in ED Medications  alum & mag hydroxide-simeth (MAALOX/MYLANTA) 200-200-20 MG/5ML suspension 30 mL (30 mLs Oral Given 11/09/20 1108)    And  lidocaine (XYLOCAINE) 2 % viscous mouth solution 15 mL (15 mLs Oral Given 11/09/20 1108)    ED Course  I have reviewed the triage vital signs and the nursing notes.  Pertinent labs & imaging results that were available during my care of the patient were reviewed by me and considered in my medical decision making (see chart for details).  Clinical Course as of Nov 09 1341  Fri Nov 09, 2020  1106 Endoscopy from 04/30/2009: ENDOSCOPIC IMPRESSION:  1) Ulcer in the duodenal bulb  2) Erosions, multiple in the antrum  3) Esophagitis in the distal esophagus  RECOMMENDATIONS:  1) await CLO biopsy results  2) anti-reflux regimen  3) PPI qam long term  4) avoid ASA, NSAIDS   [LM]  2315 64 year old male presents with complaint of epigastric abdominal pain, history of peptic ulcer disease and out of his PPI.  On exam, found to have mild epigastric tenderness also found to have tenderness with patient is right upper back without midline or bony tenderness or history of injury. Patient was given a GI cocktail, pain has resolved.  Plan is to restart his Protonix.  Review of prior endoscopy report from 2010 as documented, patient is to be on daily PPI.  Discussed importance of follow-up with GI for screening colonoscopy and possible repeat endoscopy due to history.  Patient was given referral to [facility] for PCP care as well as follow-up with low our GI.  Patient verbalizes understanding of discharge instructions and plan.  X-ray negative for free air or concerning acute findings.  Labs reassuring including CBC, CMP, urinalysis and lipase.   [LM]    Clinical Course User Index [LM] Alden Hipp   MDM  Rules/Calculators/A&P                          Final Clinical Impression(s) / ED Diagnoses Final diagnoses:  Epigastric pain  Peptic ulcer disease    Rx / DC Orders ED Discharge Orders         Ordered    pantoprazole (PROTONIX) 40 MG tablet  Daily        11/09/20 1254           Jeannie Fend, PA-C 11/09/20 1342    Linwood Dibbles, MD 11/10/20 [phone]

## 2020-11-16 ENCOUNTER — Ambulatory Visit (INDEPENDENT_AMBULATORY_CARE_PROVIDER_SITE_OTHER): Payer: Self-pay | Admitting: Primary Care

## 2020-11-21 ENCOUNTER — Ambulatory Visit (INDEPENDENT_AMBULATORY_CARE_PROVIDER_SITE_OTHER): Payer: Self-pay | Admitting: Primary Care

## 2020-11-21 ENCOUNTER — Other Ambulatory Visit: Payer: Self-pay

## 2020-11-21 ENCOUNTER — Encounter (INDEPENDENT_AMBULATORY_CARE_PROVIDER_SITE_OTHER): Payer: Self-pay | Admitting: Primary Care

## 2020-11-21 VITALS — BP 153/85 | HR 78 | Temp 97.3°F | Resp 20 | Ht 78.0 in | Wt 222.0 lb

## 2020-11-21 DIAGNOSIS — G8929 Other chronic pain: Secondary | ICD-10-CM

## 2020-11-21 DIAGNOSIS — Z7689 Persons encountering health services in other specified circumstances: Secondary | ICD-10-CM

## 2020-11-21 DIAGNOSIS — K2951 Unspecified chronic gastritis with bleeding: Secondary | ICD-10-CM

## 2020-11-21 DIAGNOSIS — Z1211 Encounter for screening for malignant neoplasm of colon: Secondary | ICD-10-CM

## 2020-11-21 DIAGNOSIS — R030 Elevated blood-pressure reading, without diagnosis of hypertension: Secondary | ICD-10-CM

## 2020-11-21 DIAGNOSIS — Z23 Encounter for immunization: Secondary | ICD-10-CM

## 2020-11-21 DIAGNOSIS — Z09 Encounter for follow-up examination after completed treatment for conditions other than malignant neoplasm: Secondary | ICD-10-CM

## 2020-11-21 DIAGNOSIS — M546 Pain in thoracic spine: Secondary | ICD-10-CM

## 2020-11-21 MED ORDER — DICLOFENAC SODIUM 1 % EX GEL: 2.0000 g | Freq: Four times a day (QID) | CUTANEOUS | 1 refills | Status: DC

## 2020-11-21 MED ORDER — OMEPRAZOLE 20 MG PO CPDR: 20.0000 mg | DELAYED_RELEASE_CAPSULE | Freq: Every day | ORAL | 0 refills | Status: DC

## 2020-11-21 NOTE — Progress Notes (Signed)
New Patient Office Visit  Subjective:  Patient ID: Caroline Matters, male    DOB: [birth year]  Age: 64 y.o. MRN: [id number]  CC:  Chief Complaint  Patient presents with  . Establish Care   [name] [phone] HPI Mr.Jontrell Berkley is a 64 year old African male who only speak [place] interpretor used [name] [phone]   presents for establishment of care and recent visit to ED on 11/09/2020 for epigastric pain with reflux- out of PPI. Repeated visit for pain and medication . Advise to establish PCP.  Past Medical History:  Diagnosis Date  . Constipation   . Gastric ulcer   . Ulcer     Past Surgical History:  Procedure Laterality Date  . LEFT HEART CATHETERIZATION WITH CORONARY ANGIOGRAM N/A 09/25/2013   Procedure: LEFT HEART CATHETERIZATION WITH CORONARY ANGIOGRAM;  Surgeon: Lennette Bihari, MD;  Location: [facility] CATH LAB;  Service: Cardiovascular;  Laterality: N/A;    Family History  Problem Relation Age of Onset  . Diabetes Mellitus II Neg Hx   . Cancer Neg Hx     Social History   Socioeconomic History  . Marital status: Single    Spouse name: Not on file  . Number of children: Not on file  . Years of education: Not on file  . Highest education level: Not on file  Occupational History  . Not on file  Tobacco Use  . Smoking status: Never Smoker  . Smokeless tobacco: Never Used  Substance and Sexual Activity  . Alcohol use: No  . Drug use: No  . Sexual activity: Not Currently  Other Topics Concern  . Not on file  Social History Narrative   Psychiatric nurse, not married.   Social Determinants of Health   Financial Resource Strain:   . Difficulty of Paying Living Expenses: Not on file  Food Insecurity:   . Worried About Programme researcher, broadcasting/film/video in the Last Year: Not on file  . Ran Out of Food in the Last Year: Not on file  Transportation Needs:   . Lack of Transportation (Medical): Not on file  . Lack of Transportation (Non-Medical): Not on file  Physical Activity:   . Days of  Exercise per Week: Not on file  . Minutes of Exercise per Session: Not on file  Stress:   . Feeling of Stress : Not on file  Social Connections:   . Frequency of Communication with Friends and Family: Not on file  . Frequency of Social Gatherings with Friends and Family: Not on file  . Attends Religious Services: Not on file  . Active Member of Clubs or Organizations: Not on file  . Attends Banker Meetings: Not on file  . Marital Status: Not on file  Intimate Partner Violence:   . Fear of Current or Ex-Partner: Not on file  . Emotionally Abused: Not on file  . Physically Abused: Not on file  . Sexually Abused: Not on file    ROS Review of Systems  Gastrointestinal: Positive for abdominal pain and anal bleeding.       Intermittent blood in stool GERD  All other systems reviewed and are negative.   Objective:   Today's Vitals: BP (!) 153/85   Pulse 78   Temp (!) 97.3 F (36.3 C)   Resp 20   Ht 6\' 6"  (1.981 m)   Wt 222 lb (100.7 kg)   SpO2 99%   BMI 25.65 kg/m   Physical Exam General: Vital signs reviewed.  Patient  is well-developed and well-nourished, in no acute distress and cooperative with exam.  Head: Normocephalic and atraumatic. Eyes: EOMI, conjunctivae normal, no scleral icterus.  Neck: Supple, trachea midline, normal ROM, no JVD, masses, thyromegaly, or carotid bruit present.  Cardiovascular: RRR, S1 normal, 2 normal, no murmurs, gallops, or rubs. Pulmonary/Chest: Clear to auscultation bilaterally, no wheezes, rales, or rhonchi. Abdominal: Soft, non-tender, non-distended, BS +, no masses, organomegaly, or guarding present.  Musculoskeletal: No joint deformities, erythema, or stiffness, ROM full and nontender. Extremities: No lower extremity edema bilaterally,  pulses symmetric and intact bilaterally. No cyanosis or clubbing. Neurological: A&O x3, Strength is normal and symmetric bilaterally,no focal motor deficit, sensory intact to light touch  bilaterally.  Skin: Warm, dry and intact. No rashes or erythema. Psychiatric: Normal mood and affect. speech and behavior is normal. Cognition and memory are normal.  Assessment & Plan:  Doyne was seen today for establish care.  Diagnoses and all orders for this visit:  Encounter to establish care -   Establishing care with PCP recurrent ED visits for treatment .  Screening for colon cancer Recommended from last ED visit f/u with GI- preventative care has a hx of anal bleed.   Hospital discharge follow-up Retrieved from ED d/c on 11/09/2020  Follow-Ups   1 Follow up with [facility] (Family Medicine); Please call clinic to schedule an appointment to see a primary care physician- done  2 Schedule an appointment with [facility] (Gastroenterology)referred to GI  Chronic gastritis with bleeding, unspecified gastritis type -     Ambulatory referral to Gastroenterology -     omeprazole (PRILOSEC) 20 MG capsule; Take 1 capsule (20 mg total) by mouth daily.  Chronic right-sided thoracic back pain Denies loss of bowel and bladder functions , Fever, chills . Walking and bending increase pain. Rest help. Tried OTC Tylenol not helping.  Discussed applying for medicaid and apply for [facility] discount to refer to ortho. Chronic right-sided thoracic back pain BACK PAIN  Location: right upper back, right mid back Quality: hot and uncomfortable Onset: sudden Worse with: unknown       Better with:massage   Radiation: no Trauma: no  Best sitting/standing/leaning forward: no  Red Flags Fecal/urinary incontinence: no  Numbness/Weakness: no  Fever/chills/sweats: no  Night pain: no  Unexplained weight loss: no  No relief with bedrest: yes  h/o cancer/immunosuppression: no  IV drug use: no  PMH of osteoporosis or chronic steroid use: no  Other orders -     diclofenac Sodium (VOLTAREN) 1 % GEL; Apply 2 g topically 4 (four) times daily.  -     Tdap vaccine  greater than or equal to 7yo IM Given today   Elevated blood pressure reading without diagnosis of hypertension Counseled on blood pressure goal of less than 130/80, low-sodium, DASH diet, , 150 minutes of moderate intensity exercise per week.   Follow-up: Return in about 3 months (around 02/21/2021) for Bp f/u diet modification only.  Includes reviewing hospital ED encounters labs, x-rays Grayce Sessions, NP

## 2020-11-21 NOTE — Progress Notes (Signed)
Stomach ulcer, was told by [name]  Has mid back pain since he was little.   [name] [phone]

## 2020-11-21 NOTE — Patient Instructions (Signed)

## 2021-02-21 ENCOUNTER — Ambulatory Visit (INDEPENDENT_AMBULATORY_CARE_PROVIDER_SITE_OTHER): Payer: Self-pay | Admitting: Primary Care

## 2021-03-01 ENCOUNTER — Ambulatory Visit (INDEPENDENT_AMBULATORY_CARE_PROVIDER_SITE_OTHER): Payer: Self-pay | Admitting: Primary Care

## 2021-03-01 ENCOUNTER — Other Ambulatory Visit: Payer: Self-pay

## 2021-03-01 ENCOUNTER — Encounter (INDEPENDENT_AMBULATORY_CARE_PROVIDER_SITE_OTHER): Payer: Self-pay | Admitting: Primary Care

## 2021-03-01 VITALS — BP 147/89 | HR 82 | Temp 97.3°F | Ht 78.0 in | Wt 224.0 lb

## 2021-03-01 DIAGNOSIS — I10 Essential (primary) hypertension: Secondary | ICD-10-CM

## 2021-03-01 NOTE — Progress Notes (Signed)
Established Patient Office Visit  Subjective:  Patient ID: Caleb Rivers, male    DOB: 1956/05/21  Age: 65 y.o. MRN: [id number]  CC:  Chief Complaint  Patient presents with  . Blood Pressure Check    HPI Mr. Caleb Rivers is a 65 year old male speaks [place] only (interputor Thurston Hole [phone]) into day for blood pressure follow and he does not take medication. States will not be taking medication for his Bp. Asked why keep this appt if you are not going to be compliant with treatment plan. "Because appointment was made for me to return" Denies shortness of breath, headaches, chest pain or lower extremity edema  Past Medical History:  Diagnosis Date  . Constipation   . Gastric ulcer   . Ulcer     Past Surgical History:  Procedure Laterality Date  . LEFT HEART CATHETERIZATION WITH CORONARY ANGIOGRAM N/A 09/25/2013   Procedure: LEFT HEART CATHETERIZATION WITH CORONARY ANGIOGRAM;  Surgeon: Lennette Bihari, MD;  Location: [facility] CATH LAB;  Service: Cardiovascular;  Laterality: N/A;    Family History  Problem Relation Age of Onset  . Diabetes Mellitus II Neg Hx   . Cancer Neg Hx     Social History   Socioeconomic History  . Marital status: Single    Spouse name: Not on file  . Number of children: Not on file  . Years of education: Not on file  . Highest education level: Not on file  Occupational History  . Not on file  Tobacco Use  . Smoking status: Never Smoker  . Smokeless tobacco: Never Used  Substance and Sexual Activity  . Alcohol use: No  . Drug use: No  . Sexual activity: Not Currently  Other Topics Concern  . Not on file  Social History Narrative   Psychiatric nurse, not married.   Social Determinants of Health   Financial Resource Strain: Not on file  Food Insecurity: Not on file  Transportation Needs: Not on file  Physical Activity: Not on file  Stress: Not on file  Social Connections: Not on file  Intimate Partner Violence: Not on file    Outpatient  Medications Prior to Visit  Medication Sig Dispense Refill  . acetaminophen (TYLENOL) 500 MG tablet Take 1,000 mg by mouth every 6 (six) hours as needed (pain). (Patient not taking: Reported on 03/01/2021)    . alum & mag hydroxide-simeth (MAALOX PLUS) 400-400-40 MG/5ML suspension Take 10 mLs by mouth every 6 (six) hours as needed for indigestion. (Patient not taking: Reported on 03/01/2021)    . docusate sodium (COLACE) 100 MG capsule Take 100 mg by mouth daily as needed for mild constipation. (Patient not taking: Reported on 03/01/2021)    . ibuprofen (ADVIL) 200 MG tablet Take 200-1,000 mg by mouth every 6 (six) hours as needed for moderate pain. (Patient not taking: Reported on 03/01/2021)    . Multiple Vitamin (MULTIVITAMIN WITH MINERALS) TABS tablet Take 1 tablet by mouth daily. (Patient not taking: Reported on 03/01/2021)    . diclofenac Sodium (VOLTAREN) 1 % GEL Apply 2 g topically 4 (four) times daily. 150 g 1  . omeprazole (PRILOSEC) 20 MG capsule Take 1 capsule (20 mg total) by mouth daily. 30 capsule 0  . polyethylene glycol (MIRALAX / GLYCOLAX) 17 g packet Take 17 g by mouth daily. (Patient not taking: Reported on 09/19/2020) 14 each 0   No facility-administered medications prior to visit.    Allergies  Allergen Reactions  . Asa [Aspirin] Other (See Comments)  Current and Hx of GI ulcer - per Pt and friend/translator  . Nsaids Other (See Comments)    Current and Hx of GI ulcer per pt and friend/translator  . Peanut-Containing Drug Products     Pain.      ROS Review of Systems Pertinent positive and negative noted in HPI   Objective:    Physical Exam Vitals reviewed.  Constitutional:      Appearance: Normal appearance.  HENT:     Head: Normocephalic.     Right Ear: External ear normal.     Left Ear: External ear normal.     Nose: Nose normal.  Eyes:     Extraocular Movements: Extraocular movements intact.  Cardiovascular:     Rate and Rhythm: Normal rate and regular  rhythm.  Pulmonary:     Effort: Pulmonary effort is normal.     Breath sounds: Normal breath sounds.  Abdominal:     General: Bowel sounds are normal.     Palpations: Abdomen is soft.  Musculoskeletal:        General: Normal range of motion.     Cervical back: Normal range of motion.  Skin:    General: Skin is warm and dry.  Neurological:     Mental Status: He is alert and oriented to person, place, and time.  Psychiatric:        Mood and Affect: Mood normal.        Behavior: Behavior normal.        Thought Content: Thought content normal.        Judgment: Judgment normal.     BP (!) 147/89 (BP Location: Right Arm, Patient Position: Sitting, Cuff Size: Large)   Pulse 82   Temp (!) 97.3 F (36.3 C) (Temporal)   Ht 6\' 6"  (1.981 m)   Wt 224 lb (101.6 kg)   SpO2 98%   BMI 25.89 kg/m  Wt Readings from Last 3 Encounters:  03/01/21 224 lb (101.6 kg)  11/21/20 222 lb (100.7 kg)  11/09/20 224 lb 13.9 oz (102 kg)     Health Maintenance Due  Topic Date Due  . Hepatitis C Screening  Never done  . COVID-19 Vaccine (1) Never done  . COLONOSCOPY (Pts 45-82yrs Insurance coverage will need to be confirmed)  Never done    There are no preventive care reminders to display for this patient.  No results found for: TSH Lab Results  Component Value Date   WBC 7.9 11/09/2020   HGB 16.0 11/09/2020   HCT 47.9 11/09/2020   MCV 93.2 11/09/2020   PLT 220 11/09/2020   Lab Results  Component Value Date   NA 136 11/09/2020   K 4.2 11/09/2020   CO2 22 11/09/2020   GLUCOSE 107 (H) 11/09/2020   BUN 12 11/09/2020   CREATININE 1.23 11/09/2020   BILITOT 0.8 11/09/2020   ALKPHOS 56 11/09/2020   AST 40 11/09/2020   ALT 34 11/09/2020   PROT 8.2 (H) 11/09/2020   ALBUMIN 4.1 11/09/2020   CALCIUM 9.4 11/09/2020   ANIONGAP 12 11/09/2020   Lab Results  Component Value Date   CHOL  04/29/2009    134        ATP III CLASSIFICATION:  <200     mg/dL   Desirable  06/29/2009  mg/dL    Borderline High  222-979    mg/dL   High          Lab Results  Component Value Date   HDL 34 (L)  04/29/2009   Lab Results  Component Value Date   LDLCALC  04/29/2009    91        Total Cholesterol/HDL:CHD Risk Coronary Heart Disease Risk Table                     Men   Women  1/2 Average Risk   3.4   3.3  Average Risk       5.0   4.4  2 X Average Risk   9.6   7.1  3 X Average Risk  23.4   11.0        Use the calculated Patient Ratio above and the CHD Risk Table to determine the patient's CHD Risk.        ATP III CLASSIFICATION (LDL):  <100     mg/dL   Optimal  570-177  mg/dL   Near or Above                    Optimal  130-159  mg/dL   Borderline  939-030  mg/dL   High  >092     mg/dL   Very High   Lab Results  Component Value Date   TRIG 43 04/29/2009   Lab Results  Component Value Date   CHOLHDL 3.9 04/29/2009   Lab Results  Component Value Date   HGBA1C  04/29/2009    5.0 (NOTE) The ADA recommends the following therapeutic goal for glycemic control related to Hgb A1c measurement: Goal of therapy: <6.5 Hgb A1c  Reference: American Diabetes Association: Clinical Practice Recommendations 2010, Diabetes Care, 2010, 33: (Suppl  1).      Assessment & Plan:   Tyreece was seen today for blood pressure check.  Diagnoses and all orders for this visit:  Essential hypertension Counseled on blood pressure goal of less than 130/80, low-sodium, DASH diet, medication compliance, 150 minutes of moderate intensity exercise per week.   Follow-up: Return if symptoms worsen or fail to improve, for When patient becomes compliant .    Grayce Sessions, NP

## 2021-10-05 ENCOUNTER — Emergency Department (HOSPITAL_COMMUNITY): Payer: Self-pay

## 2021-10-05 ENCOUNTER — Other Ambulatory Visit: Payer: Self-pay

## 2021-10-05 ENCOUNTER — Encounter (HOSPITAL_COMMUNITY): Payer: Self-pay | Admitting: Emergency Medicine

## 2021-10-05 ENCOUNTER — Emergency Department (HOSPITAL_COMMUNITY)
Admission: EM | Admit: 2021-10-05 | Discharge: 2021-10-05 | Disposition: A | Discharge status: Home / Self Care | Payer: Self-pay | Attending: Emergency Medicine | Admitting: Emergency Medicine

## 2021-10-05 DIAGNOSIS — R1010 Upper abdominal pain, unspecified: Secondary | ICD-10-CM

## 2021-10-05 DIAGNOSIS — Z91010 Allergy to peanuts: Secondary | ICD-10-CM | POA: Insufficient documentation

## 2021-10-05 DIAGNOSIS — R1011 Right upper quadrant pain: Secondary | ICD-10-CM | POA: Insufficient documentation

## 2021-10-05 DIAGNOSIS — Z955 Presence of coronary angioplasty implant and graft: Secondary | ICD-10-CM | POA: Insufficient documentation

## 2021-10-05 DIAGNOSIS — Y939 Activity, unspecified: Secondary | ICD-10-CM | POA: Insufficient documentation

## 2021-10-05 DIAGNOSIS — R1013 Epigastric pain: Secondary | ICD-10-CM | POA: Insufficient documentation

## 2021-10-05 LAB — COMPREHENSIVE METABOLIC PANEL
ALT: 52 U/L — ABNORMAL HIGH (ref 0–44)
AST: 48 U/L — ABNORMAL HIGH (ref 15–41)
Albumin: 4 g/dL (ref 3.5–5.0)
Alkaline Phosphatase: 65 U/L (ref 38–126)
Anion gap: 9 (ref 5–15)
BUN: 15 mg/dL (ref 8–23)
CO2: 23 mmol/L (ref 22–32)
Calcium: 9.5 mg/dL (ref 8.9–10.3)
Chloride: 105 mmol/L (ref 98–111)
Creatinine, Ser: 1.19 mg/dL (ref 0.61–1.24)
GFR, Estimated: >60 mL/min (ref >60)
Glucose, Bld: 100 mg/dL — ABNORMAL HIGH (ref 70–99)
Potassium: 4.2 mmol/L (ref 3.5–5.1)
Sodium: 137 mmol/L (ref 135–145)
Total Bilirubin: 1.1 mg/dL (ref 0.3–1.2)
Total Protein: 8.2 g/dL — ABNORMAL HIGH (ref 6.5–8.1)

## 2021-10-05 LAB — URINALYSIS, ROUTINE W REFLEX MICROSCOPIC
Bilirubin Urine: NEGATIVE
Glucose, UA: NEGATIVE mg/dL
Hgb urine dipstick: NEGATIVE
Ketones, ur: NEGATIVE mg/dL
Leukocytes,Ua: NEGATIVE
Nitrite: NEGATIVE
Protein, ur: NEGATIVE mg/dL
Specific Gravity, Urine: 1.012 (ref 1.005–1.030)
pH: 6 (ref 5.0–8.0)

## 2021-10-05 LAB — CBC
HCT: 53.3 % — ABNORMAL HIGH (ref 39.0–52.0)
Hemoglobin: 17.5 g/dL — ABNORMAL HIGH (ref 13.0–17.0)
MCH: 31.6 pg (ref 26.0–34.0)
MCHC: 32.8 g/dL (ref 30.0–36.0)
MCV: 96.4 fL (ref 80.0–100.0)
Platelets: 217 10*3/uL (ref 150–400)
RBC: 5.53 MIL/uL (ref 4.22–5.81)
RDW: 14.2 % (ref 11.5–15.5)
WBC: 7.3 10*3/uL (ref 4.0–10.5)
nRBC: 0 % (ref 0.0–0.2)

## 2021-10-05 LAB — TROPONIN I (HIGH SENSITIVITY)
Troponin I (High Sensitivity): 7 ng/L (ref <18)
Troponin I (High Sensitivity): 7 ng/L (ref <18)

## 2021-10-05 LAB — LIPASE, BLOOD: Lipase: 31 U/L (ref 11–51)

## 2021-10-05 MED ORDER — LIDOCAINE VISCOUS HCL 2 % MT SOLN
15.0000 mL | Freq: Once | OROMUCOSAL | Status: AC
  Administered 2021-10-05: 15 mL via ORAL
  Filled 2021-10-05: qty 15

## 2021-10-05 MED ORDER — ALUM & MAG HYDROXIDE-SIMETH 200-200-20 MG/5ML PO SUSP
30.0000 mL | Freq: Once | ORAL | Status: AC
  Administered 2021-10-05: 30 mL via ORAL
  Filled 2021-10-05: qty 30

## 2021-10-05 MED ORDER — FAMOTIDINE 20 MG PO TABS
40.0000 mg | ORAL_TABLET | Freq: Once | ORAL | Status: AC
  Administered 2021-10-05: 40 mg via ORAL
  Filled 2021-10-05: qty 2

## 2021-10-05 MED ORDER — PANTOPRAZOLE SODIUM 40 MG PO TBEC: 40.0000 mg | DELAYED_RELEASE_TABLET | Freq: Every day | ORAL | 0 refills | Status: DC

## 2021-10-05 NOTE — ED Provider Notes (Signed)
[facility] EMERGENCY DEPARTMENT Provider Note   CSN: [id number] Arrival date & time: 10/05/21  1247     History Chief Complaint  Patient presents with   Abdominal Pain    Caleb Rivers is a 65 y.o. male.  HPI This is a 65 year old male with history of peptic ulcer disease, esophagitis, prior small bowel obstruction who presents with abdominal pain.  Patient describes pain as epigastric and in the right upper quadrant radiating around to the back on the right side, 5 out of 10 in severity, sharp.  Unable to state if pain is worse after eating.  Previously on a PPI but has not been taking this.  Denies associated fever, chills, vomiting, diarrhea, or constipation.  Having normal bowel movements.  Pain is intermittent and currently not present.    Past Medical History:  Diagnosis Date   Constipation    Gastric ulcer    Ulcer     Patient Active Problem List   Diagnosis Date Noted   History of peptic ulcer disease 09/05/2019   SBO (small bowel obstruction) (HCC) 08/30/2014   Abdominal pain, generalized 08/30/2014   Nausea and vomiting 08/30/2014   Abnormal resting ECG findings - repolarization changes; NOT STEMI 09/26/2013   Gastritis 09/25/2013    Past Surgical History:  Procedure Laterality Date   LEFT HEART CATHETERIZATION WITH CORONARY ANGIOGRAM N/A 09/25/2013   Procedure: LEFT HEART CATHETERIZATION WITH CORONARY ANGIOGRAM;  Surgeon: Lennette Bihari, MD;  Location: [facility] CATH LAB;  Service: Cardiovascular;  Laterality: N/A;       Family History  Problem Relation Age of Onset   Diabetes Mellitus II Neg Hx    Cancer Neg Hx     Social History   Tobacco Use   Smoking status: Never   Smokeless tobacco: Never  Substance Use Topics   Alcohol use: No   Drug use: No    Home Medications Prior to Admission medications   Medication Sig Start Date End Date Taking? Authorizing Provider  pantoprazole (PROTONIX) 40 MG tablet Take 1 tablet (40 mg total)  by mouth daily. 10/05/21 11/04/21 Yes Doran Clay, MD  acetaminophen (TYLENOL) 500 MG tablet Take 1,000 mg by mouth every 6 (six) hours as needed (pain). Patient not taking: Reported on 03/01/2021    [provider]  alum & mag hydroxide-simeth (MAALOX PLUS) 400-400-40 MG/5ML suspension Take 10 mLs by mouth every 6 (six) hours as needed for indigestion. Patient not taking: Reported on 03/01/2021    [provider]  docusate sodium (COLACE) 100 MG capsule Take 100 mg by mouth daily as needed for mild constipation. Patient not taking: Reported on 03/01/2021    [provider]  ibuprofen (ADVIL) 200 MG tablet Take 200-1,000 mg by mouth every 6 (six) hours as needed for moderate pain. Patient not taking: Reported on 03/01/2021    [provider]  Multiple Vitamin (MULTIVITAMIN WITH MINERALS) TABS tablet Take 1 tablet by mouth daily. Patient not taking: Reported on 03/01/2021    [provider]    Allergies    Asa [aspirin], Nsaids, and Peanut-containing drug products  Review of Systems   Review of Systems  Constitutional:  Negative for chills and fever.  HENT:  Negative for ear pain and sore throat.   Eyes:  Negative for pain and visual disturbance.  Respiratory:  Negative for cough and shortness of breath.   Cardiovascular:  Negative for chest pain and palpitations.  Gastrointestinal:  Positive for abdominal pain. Negative for abdominal  distention and vomiting.  Genitourinary:  Negative for dysuria and hematuria.  Musculoskeletal:  Negative for arthralgias and back pain.  Skin:  Negative for color change and rash.  Neurological:  Negative for seizures and syncope.  All other systems reviewed and are negative.  Physical Exam Updated Vital Signs BP (!) 144/95 (BP Location: Right Arm)   Pulse 72   Temp 98.1 F (36.7 C) (Oral)   Resp 14   SpO2 100%   Physical Exam Vitals and nursing note reviewed.  Constitutional:      Appearance: He is  well-developed.  HENT:     Head: Normocephalic and atraumatic.  Eyes:     Conjunctiva/sclera: Conjunctivae normal.  Cardiovascular:     Rate and Rhythm: Normal rate and regular rhythm.     Heart sounds: No murmur heard. Pulmonary:     Effort: Pulmonary effort is normal. No respiratory distress.     Breath sounds: Normal breath sounds.  Abdominal:     Palpations: Abdomen is soft.     Tenderness: There is abdominal tenderness in the right upper quadrant and epigastric area. There is no right CVA tenderness, left CVA tenderness, guarding or rebound. Negative signs include Murphy's sign.  Musculoskeletal:     Cervical back: Neck supple.  Skin:    General: Skin is warm and dry.  Neurological:     Mental Status: He is alert.    ED Results / Procedures / Treatments   Labs (all labs ordered are listed, but only abnormal results are displayed) Labs Reviewed  COMPREHENSIVE METABOLIC PANEL - Abnormal; Notable for the following components:      Result Value   Glucose, Bld 100 (*)    Total Protein 8.2 (*)    AST 48 (*)    ALT 52 (*)    All other components within normal limits  CBC - Abnormal; Notable for the following components:   Hemoglobin 17.5 (*)    HCT 53.3 (*)    All other components within normal limits  LIPASE, BLOOD  URINALYSIS, ROUTINE W REFLEX MICROSCOPIC  TROPONIN I (HIGH SENSITIVITY)  TROPONIN I (HIGH SENSITIVITY)    EKG EKG Interpretation  Date/Time:  Saturday October 05 2021 13:49:45 EDT Ventricular Rate:  78 PR Interval:  134 QRS Duration: 82 QT Interval:  366 QTC Calculation: 417 R Axis:   -57 Text Interpretation: Normal sinus rhythm Left anterior fascicular block Cannot rule out Anteroseptal infarct , age undetermined Abnormal ECG No significant change since last tracing Confirmed by Jacalyn Lefevre [id number]) on 10/05/2021 4:48:49 PM  Radiology DG Chest 2 View  Result Date: 10/05/2021 CLINICAL DATA:  Intermittent epigastric pain for 1 week radiating to  back EXAM: CHEST - 2 VIEW COMPARISON:  03/14/2015 FINDINGS: Normal heart size, mediastinal contours, and pulmonary vascularity. Lungs clear. Calcified granuloma RIGHT upper lobe stable. No infiltrate, pleural effusion, or pneumothorax. Osseous structures unremarkable. IMPRESSION: No acute abnormalities. Electronically Signed   By: Ulyses Southward M.D.   On: 10/05/2021 14:24    Procedures Procedures   Medications Ordered in ED Medications  alum & mag hydroxide-simeth (MAALOX/MYLANTA) 200-200-20 MG/5ML suspension 30 mL (30 mLs Oral Given 10/05/21 1732)    And  lidocaine (XYLOCAINE) 2 % viscous mouth solution 15 mL (15 mLs Oral Given 10/05/21 1732)  famotidine (PEPCID) tablet 40 mg (40 mg Oral Given 10/05/21 1731)    ED Course  I have reviewed the triage vital signs and the nursing notes.  Pertinent labs & imaging results that were available during my  care of the patient were reviewed by me and considered in my medical decision making (see chart for details).    MDM Rules/Calculators/A&P                         Patient is hemodynamically stable and well-appearing on exam.  Abdomen is soft and nontender.  Specifically unable to reproduce tenderness in the epigastrium or right upper quadrant.  Negative Murphy sign and no CVA tenderness or flank pain.  No vomiting, p.o. intolerance, and patient is having normal bowel movements, low suspicion for obstruction.  No evidence of cholecystitis but symptomatic cholelithiasis remains on the differential.  Other items considered are peptic ulcer disease.  Patient given Maalox with lidocaine and improvement of his symptoms, consistent with likely peptic ulcer disease.  Last endoscopy performed 10 years ago.  Workup in triage also performed for atypical chest pain with unremarkable EKG, CXR, and troponin x2. Low suspicion for ACS based on workup and history.   We will place referral to GI for repeat evaluation and endoscopy.  Discharged home with PPI.  Given strict  return precautions.  Patient expressed understanding.   Final Clinical Impression(s) / ED Diagnoses Final diagnoses:  Pain of upper abdomen    Rx / DC Orders ED Discharge Orders          Ordered    Ambulatory referral to Gastroenterology        10/05/21 1708    pantoprazole (PROTONIX) 40 MG tablet  Daily        10/05/21 1713             Doran Clay, MD 10/05/21 2321    Jacalyn Lefevre, MD 10/05/21 2325

## 2021-10-05 NOTE — Discharge Instructions (Addendum)
Your pain is likely due to peptic ulcer disease that you have had before. Please take Protonix 40 mg daily to help with symptoms. Follow up with gastroenterology for another endoscopy. A referral to them has been placed. Please also follow up with your PCP. Return to the Ed with new or worsening symptoms including inability to tolerate food or drink by mouth.

## 2021-10-05 NOTE — ED Triage Notes (Signed)
Reports intermittent epigastric pain x 1 week that radiates to back.  Denies sob, nausea, and vomiting.

## 2021-10-05 NOTE — Care Management (Signed)
Patient needing medication assistance uninsured, MATCH done   [facility] Medication Assistance Card Name:   Caleb Rivers ID: [id number] Bin: [id number] RX Group: [id number] Discharge Date:  10/05/21 Expiration Date:  10/12/21

## 2022-01-30 ENCOUNTER — Encounter: Payer: Self-pay | Admitting: Emergency Medicine

## 2022-02-15 ENCOUNTER — Emergency Department (HOSPITAL_COMMUNITY)
Admission: EM | Admit: 2022-02-15 | Discharge: 2022-02-15 | Disposition: A | Discharge status: Home / Self Care | Payer: Self-pay | Attending: Emergency Medicine | Admitting: Emergency Medicine

## 2022-02-15 ENCOUNTER — Other Ambulatory Visit: Payer: Self-pay

## 2022-02-15 ENCOUNTER — Encounter (HOSPITAL_COMMUNITY): Payer: Self-pay | Admitting: Emergency Medicine

## 2022-02-15 DIAGNOSIS — K219 Gastro-esophageal reflux disease without esophagitis: Secondary | ICD-10-CM | POA: Insufficient documentation

## 2022-02-15 DIAGNOSIS — R197 Diarrhea, unspecified: Secondary | ICD-10-CM | POA: Insufficient documentation

## 2022-02-15 DIAGNOSIS — R110 Nausea: Secondary | ICD-10-CM | POA: Insufficient documentation

## 2022-02-15 DIAGNOSIS — Z91010 Allergy to peanuts: Secondary | ICD-10-CM | POA: Insufficient documentation

## 2022-02-15 LAB — URINALYSIS, ROUTINE W REFLEX MICROSCOPIC
Bilirubin Urine: NEGATIVE
Glucose, UA: NEGATIVE mg/dL
Hgb urine dipstick: NEGATIVE
Ketones, ur: NEGATIVE mg/dL
Leukocytes,Ua: NEGATIVE
Nitrite: NEGATIVE
Protein, ur: NEGATIVE mg/dL
Specific Gravity, Urine: 1.005 (ref 1.005–1.030)
pH: 6 (ref 5.0–8.0)

## 2022-02-15 LAB — COMPREHENSIVE METABOLIC PANEL
ALT: 45 U/L — ABNORMAL HIGH (ref 0–44)
AST: 37 U/L (ref 15–41)
Albumin: 3.8 g/dL (ref 3.5–5.0)
Alkaline Phosphatase: 60 U/L (ref 38–126)
Anion gap: 8 (ref 5–15)
BUN: 18 mg/dL (ref 8–23)
CO2: 23 mmol/L (ref 22–32)
Calcium: 9 mg/dL (ref 8.9–10.3)
Chloride: 104 mmol/L (ref 98–111)
Creatinine, Ser: 1.22 mg/dL (ref 0.61–1.24)
GFR, Estimated: >60 mL/min (ref >60)
Glucose, Bld: 115 mg/dL — ABNORMAL HIGH (ref 70–99)
Potassium: 4.1 mmol/L (ref 3.5–5.1)
Sodium: 135 mmol/L (ref 135–145)
Total Bilirubin: 0.6 mg/dL (ref 0.3–1.2)
Total Protein: 8 g/dL (ref 6.5–8.1)

## 2022-02-15 LAB — CBC
HCT: 50.3 % (ref 39.0–52.0)
Hemoglobin: 16.5 g/dL (ref 13.0–17.0)
MCH: 31.5 pg (ref 26.0–34.0)
MCHC: 32.8 g/dL (ref 30.0–36.0)
MCV: 96.2 fL (ref 80.0–100.0)
Platelets: 225 10*3/uL (ref 150–400)
RBC: 5.23 MIL/uL (ref 4.22–5.81)
RDW: 14.2 % (ref 11.5–15.5)
WBC: 8.5 10*3/uL (ref 4.0–10.5)
nRBC: 0 % (ref 0.0–0.2)

## 2022-02-15 LAB — LIPASE, BLOOD: Lipase: 30 U/L (ref 11–51)

## 2022-02-15 MED ORDER — ALUM & MAG HYDROXIDE-SIMETH 200-200-20 MG/5ML PO SUSP
30.0000 mL | Freq: Once | ORAL | Status: AC
  Administered 2022-02-15: 30 mL via ORAL
  Filled 2022-02-15: qty 30

## 2022-02-15 MED ORDER — LIDOCAINE VISCOUS HCL 2 % MT SOLN
15.0000 mL | Freq: Once | OROMUCOSAL | Status: AC
  Administered 2022-02-15: 15 mL via ORAL
  Filled 2022-02-15: qty 15

## 2022-02-15 MED ORDER — PANTOPRAZOLE SODIUM 40 MG PO TBEC
40.0000 mg | DELAYED_RELEASE_TABLET | Freq: Every day | ORAL | Status: DC
  Administered 2022-02-15: 40 mg via ORAL
  Filled 2022-02-15: qty 1

## 2022-02-15 MED ORDER — PANTOPRAZOLE SODIUM 20 MG PO TBEC: 20.0000 mg | DELAYED_RELEASE_TABLET | Freq: Every day | ORAL | 2 refills | Status: DC

## 2022-02-15 NOTE — ED Provider Triage Note (Signed)
Emergency Medicine Provider Triage Evaluation Note  Caleb Rivers , a 66 y.o. male  was evaluated in triage.  Pt complains of pain "all over" for the last 4 months .  He states he has been having sore throat, upper abdominal pain, and back pain that waxes and wanes in intensity.  Is also complaining of some nausea.  Said that he was seen previously for this and was given a medicine for a month, but he is finished this and his pain returned.  [place] interpreter used in triage  Review of Systems  As above  Physical Exam  BP (!) 189/102    Pulse (!) 103    Temp 98.6 F (37 C) (Oral)    Resp 16    SpO2 96%  Gen:   Awake, no distress   Resp:  Normal effort  MSK:   Moves extremities without difficulty  Other:    Medical Decision Making  Medically screening exam initiated at 12:51 PM.  Appropriate orders placed.  Tilman Blundell was informed that the remainder of the evaluation will be completed by another provider, this initial triage assessment does not replace that evaluation, and the importance of remaining in the ED until their evaluation is complete.    Neema Barreira T, PA-C 02/15/22 1252

## 2022-02-15 NOTE — ED Provider Notes (Signed)
[facility] EMERGENCY DEPARTMENT Provider Note   CSN: [id number] Arrival date & time: 02/15/22  1230     History  Chief Complaint  Patient presents with   Abdominal Pain    Caleb Rivers is a 66 y.o. male with history of gastric ulcer, GERD, abdominal pain.  Patient presents to ED for evaluation of abdominal pain that he has had for 2 weeks.  Patient states that the pain is located in his stomach.  Patient states that the pain comes and goes, worse after eating meals that are high in fat and greasy.  Patient states that it is a burning sensation.  The patient was seen for the same complaint in October 2022, diagnosed with GERD and given prescription for Protonix.  Patient is requesting refill of Protonix.  The patient reports that the prescription of Protonix helped alleviate his discomfort however he ran out and the symptoms returned.  Patient endorsing nausea, diarrhea, abdominal pain.  Patient denies vomiting, lightheadedness, dizziness, chest pain, shortness of breath.   Abdominal Pain Associated symptoms: diarrhea and nausea   Associated symptoms: no chest pain, no constipation, no fever, no shortness of breath and no vomiting       Home Medications Prior to Admission medications   Medication Sig Start Date End Date Taking? Authorizing Provider  pantoprazole (PROTONIX) 20 MG tablet Take 1 tablet (20 mg total) by mouth daily. 02/15/22  Yes Al Decant, PA-C  acetaminophen (TYLENOL) 500 MG tablet Take 1,000 mg by mouth every 6 (six) hours as needed (pain). Patient not taking: Reported on 03/01/2021    [provider]  alum & mag hydroxide-simeth (MAALOX PLUS) 400-400-40 MG/5ML suspension Take 10 mLs by mouth every 6 (six) hours as needed for indigestion. Patient not taking: Reported on 03/01/2021    [provider]  docusate sodium (COLACE) 100 MG capsule Take 100 mg by mouth daily as needed for mild constipation. Patient not taking:  Reported on 03/01/2021    [provider]  ibuprofen (ADVIL) 200 MG tablet Take 200-1,000 mg by mouth every 6 (six) hours as needed for moderate pain. Patient not taking: Reported on 03/01/2021    [provider]  Multiple Vitamin (MULTIVITAMIN WITH MINERALS) TABS tablet Take 1 tablet by mouth daily. Patient not taking: Reported on 03/01/2021    [provider]      Allergies    Asa [aspirin], Nsaids, and Peanut-containing drug products    Review of Systems   Review of Systems  Constitutional:  Negative for fever.  Respiratory:  Negative for shortness of breath.   Cardiovascular:  Negative for chest pain.  Gastrointestinal:  Positive for abdominal pain, diarrhea and nausea. Negative for constipation and vomiting.  Neurological:  Negative for dizziness, weakness and light-headedness.  All other systems reviewed and are negative.  Physical Exam Updated Vital Signs BP (!) 146/90    Pulse 70    Temp 98.6 F (37 C) (Oral)    Resp 18    SpO2 97%  Physical Exam Vitals and nursing note reviewed.  Constitutional:      General: He is not in acute distress.    Appearance: He is well-developed. He is not ill-appearing, toxic-appearing or diaphoretic.  HENT:     Head: Normocephalic and atraumatic.     Mouth/Throat:     Mouth: Mucous membranes are moist.  Eyes:     General: No scleral icterus.    Extraocular Movements: Extraocular movements intact.     Pupils: Pupils  are equal, round, and reactive to light.  Cardiovascular:     Rate and Rhythm: Normal rate and regular rhythm.  Pulmonary:     Effort: Pulmonary effort is normal.     Breath sounds: Normal breath sounds. No wheezing.  Abdominal:     General: Abdomen is flat. Bowel sounds are normal. There is no distension.     Palpations: Abdomen is soft.     Tenderness: There is no abdominal tenderness.  Musculoskeletal:     Cervical back: Normal range of motion and neck supple. No tenderness.  Skin:    General:  Skin is warm and dry.     Capillary Refill: Capillary refill takes less than 2 seconds.  Neurological:     Mental Status: He is alert and oriented to person, place, and time.    ED Results / Procedures / Treatments   Labs (all labs ordered are listed, but only abnormal results are displayed) Labs Reviewed  COMPREHENSIVE METABOLIC PANEL - Abnormal; Notable for the following components:      Result Value   Glucose, Bld 115 (*)    ALT 45 (*)    All other components within normal limits  URINALYSIS, ROUTINE W REFLEX MICROSCOPIC - Abnormal; Notable for the following components:   Color, Urine STRAW (*)    All other components within normal limits  LIPASE, BLOOD  CBC    EKG None  Radiology No results found.  Procedures Procedures    Medications Ordered in ED Medications  pantoprazole (PROTONIX) EC tablet 40 mg (40 mg Oral Given 02/15/22 1444)  alum & mag hydroxide-simeth (MAALOX/MYLANTA) 200-200-20 MG/5ML suspension 30 mL (30 mLs Oral Given 02/15/22 1444)    And  lidocaine (XYLOCAINE) 2 % viscous mouth solution 15 mL (15 mLs Oral Given 02/15/22 1444)    ED Course/ Medical Decision Making/ A&P                           Medical Decision Making Risk OTC drugs. Prescription drug management.   CT 23-year-old male presents for evaluation of abdominal pain.  On examination, the patient is afebrile, nontachycardic, nonhypoxic, clear lung sounds bilaterally, soft compressible abdomen.  Patient states that he is having burning epigastric pain, worsened after eating large fatty meals.  Patient is also belching continuously throughout the course of the interview.  Patient seen for the same complaint, given prescription for Protonix which he states alleviated his discomfort.  He states that he ran out of the medication and his symptoms returned.  He is here requesting a refill.  Patient worked up utilizing following labs and imaging studies: UA, CBC, lipase, CMP UA unremarkable Lipase  unremarkable CBC unremarkable CMP unremarkable  Patient treated utilizing Protonix 40 mg tablet as well as Maalox/Mylanta and Xylocaine.  Patient reports after administration of medications, his symptoms have abated.  He states that he has no upper abdominal discomfort, he is not belching anymore.  At this time, the patient is stable for discharge.  I will provide the patient with a prescription for Protonix as well as 2 refills.  I have advised the patient he will need to follow-up with his PCP in the future for any future refills.  I have advised the patient of return precautions and he voices understanding.  I have answered all the questions of the patient to his satisfaction.  A language interpreter was utilized for the duration of this interaction.  Patient is stable for discharge.  Final Clinical Impression(s) / ED Diagnoses Final diagnoses:  Gastroesophageal reflux disease, unspecified whether esophagitis present    Rx / DC Orders ED Discharge Orders          Ordered    pantoprazole (PROTONIX) 20 MG tablet  Daily        02/15/22 1553              Al Decant, [place] 02/15/22 1554    Wynetta Fines, MD 02/15/22 [phone]

## 2022-02-15 NOTE — ED Triage Notes (Signed)
Pt reports generalized abd pain x 1 month that is worse x 2 days.  States he was seen for same and took Rx (Protonix) for 1 month that caused his throat to be dry.  Mild nausea.  Denies vomiting and diarrhea.  Hx of gastric ulcers and constipation.

## 2022-02-15 NOTE — Discharge Instructions (Signed)
Please return to the ED with any new or worsening signs or symptoms such as blood in stool, blood in vomit, abdominal pain that is unrelenting, chest pains, shortness of breath Please follow-up with your primary care physician for any ongoing needs related to your GERD Please pick up the prescription of Protonix I have sent to the pharmacy.  I have given you 2 refills. Please read the attached informational brochure concerning food choices for patients with GERD

## 2022-09-08 ENCOUNTER — Encounter (HOSPITAL_COMMUNITY): Payer: Self-pay | Admitting: Emergency Medicine

## 2022-09-08 ENCOUNTER — Emergency Department (HOSPITAL_COMMUNITY)
Admission: EM | Admit: 2022-09-08 | Discharge: 2022-09-08 | Disposition: A | Discharge status: Home / Self Care | Payer: Self-pay | Attending: Emergency Medicine | Admitting: Emergency Medicine

## 2022-09-08 ENCOUNTER — Other Ambulatory Visit: Payer: Self-pay

## 2022-09-08 DIAGNOSIS — Z91010 Allergy to peanuts: Secondary | ICD-10-CM | POA: Insufficient documentation

## 2022-09-08 DIAGNOSIS — Z20822 Contact with and (suspected) exposure to covid-19: Secondary | ICD-10-CM | POA: Insufficient documentation

## 2022-09-08 DIAGNOSIS — R109 Unspecified abdominal pain: Secondary | ICD-10-CM | POA: Insufficient documentation

## 2022-09-08 DIAGNOSIS — R632 Polyphagia: Secondary | ICD-10-CM | POA: Insufficient documentation

## 2022-09-08 DIAGNOSIS — M791 Myalgia, unspecified site: Secondary | ICD-10-CM | POA: Insufficient documentation

## 2022-09-08 LAB — CK: Total CK: 218 U/L (ref 49–397)

## 2022-09-08 LAB — CBC WITH DIFFERENTIAL/PLATELET
Abs Immature Granulocytes: 0.04 10*3/uL (ref 0.00–0.07)
Basophils Absolute: 0.1 10*3/uL (ref 0.0–0.1)
Basophils Relative: 1 %
Eosinophils Absolute: 0.1 10*3/uL (ref 0.0–0.5)
Eosinophils Relative: 1 %
HCT: 49.3 % (ref 39.0–52.0)
Hemoglobin: 16.6 g/dL (ref 13.0–17.0)
Immature Granulocytes: 1 %
Lymphocytes Relative: 27 %
Lymphs Abs: 1.9 10*3/uL (ref 0.7–4.0)
MCH: 32.4 pg (ref 26.0–34.0)
MCHC: 33.7 g/dL (ref 30.0–36.0)
MCV: 96.1 fL (ref 80.0–100.0)
Monocytes Absolute: 0.7 10*3/uL (ref 0.1–1.0)
Monocytes Relative: 10 %
Neutro Abs: 4.1 10*3/uL (ref 1.7–7.7)
Neutrophils Relative %: 60 %
Platelets: 223 10*3/uL (ref 150–400)
RBC: 5.13 MIL/uL (ref 4.22–5.81)
RDW: 14.2 % (ref 11.5–15.5)
WBC: 6.8 10*3/uL (ref 4.0–10.5)
nRBC: 0 % (ref 0.0–0.2)

## 2022-09-08 LAB — URINALYSIS, ROUTINE W REFLEX MICROSCOPIC
Bacteria, UA: NONE SEEN
Bilirubin Urine: NEGATIVE
Glucose, UA: NEGATIVE mg/dL
Hgb urine dipstick: NEGATIVE
Ketones, ur: NEGATIVE mg/dL
Leukocytes,Ua: NEGATIVE
Nitrite: NEGATIVE
Protein, ur: NEGATIVE mg/dL
Specific Gravity, Urine: 1.024 (ref 1.005–1.030)
pH: 5 (ref 5.0–8.0)

## 2022-09-08 LAB — COMPREHENSIVE METABOLIC PANEL
ALT: 49 U/L — ABNORMAL HIGH (ref 0–44)
AST: 32 U/L (ref 15–41)
Albumin: 3.7 g/dL (ref 3.5–5.0)
Alkaline Phosphatase: 72 U/L (ref 38–126)
Anion gap: 10 (ref 5–15)
BUN: 20 mg/dL (ref 8–23)
CO2: 23 mmol/L (ref 22–32)
Calcium: 9.4 mg/dL (ref 8.9–10.3)
Chloride: 106 mmol/L (ref 98–111)
Creatinine, Ser: 1.23 mg/dL (ref 0.61–1.24)
GFR, Estimated: >60 mL/min (ref >60)
Glucose, Bld: 110 mg/dL — ABNORMAL HIGH (ref 70–99)
Potassium: 3.9 mmol/L (ref 3.5–5.1)
Sodium: 139 mmol/L (ref 135–145)
Total Bilirubin: 0.5 mg/dL (ref 0.3–1.2)
Total Protein: 7.6 g/dL (ref 6.5–8.1)

## 2022-09-08 LAB — RESP PANEL BY RT-PCR (FLU A&B, COVID) ARPGX2
Influenza A by PCR: NEGATIVE
Influenza B by PCR: NEGATIVE
SARS Coronavirus 2 by RT PCR: NEGATIVE

## 2022-09-08 LAB — LIPASE, BLOOD: Lipase: 31 U/L (ref 11–51)

## 2022-09-08 MED ORDER — OMEPRAZOLE 20 MG PO CPDR: 20.0000 mg | DELAYED_RELEASE_CAPSULE | Freq: Every day | ORAL | 0 refills | Status: DC

## 2022-09-08 NOTE — ED Triage Notes (Signed)
Pt c/o not feeling well, c/o always being hungry even after he eats.  Denies n/v/d or fevers.

## 2022-09-08 NOTE — ED Provider Triage Note (Signed)
Emergency Medicine Provider Triage Evaluation Note  Caleb Rivers , a 66 y.o. male  was evaluated in triage.  Pt complains of generally not feeling well, always hungry even after he has just eaten.  Reports generalized body aches.  No sick contacts-- lives alone.  No vomiting or diarrhea.   No fever/chills.  Occasional abdominal pain, denies this currently.  Review of Systems  Positive: Abdominal pain, body aches Negative: fever  Physical Exam  BP (!) 169/106   Pulse (!) 102   Temp 98.5 F (36.9 C) (Oral)   Resp 18   SpO2 100%  Gen:   Awake, no distress   Resp:  Normal effort  MSK:   Moves extremities without difficulty  Other:    Medical Decision Making  Medically screening exam initiated at 1:03 AM.  Appropriate orders placed.  Caleb Rivers was informed that the remainder of the evaluation will be completed by another provider, this initial triage assessment does not replace that evaluation, and the importance of remaining in the ED until their evaluation is complete.  Multiple complaints.  Labs sent along with covid screen.   Garlon Hatchet, PA-C 09/08/22 0107

## 2022-09-08 NOTE — ED Provider Notes (Signed)
[facility] EMERGENCY DEPARTMENT Provider Note   CSN: [id number] Arrival date & time: 09/08/22  0050     History  Chief Complaint  Patient presents with   Abdominal Pain    Caleb Rivers is a 66 y.o. male.  66 year old male with complaint of not feeling well, reports feeling hungry all the time with body aches. Complaint of a stomach ulcer that hurts at times, has been ongoing for a long time, takes Protonix from the ER but is out. Sensation of being hungry all the time is new, sometimes feels so hungry he feels sick, he eats but feeling of being hungry does not go away. Does not take NSAIDs, only Tylenol. Does not drink alcohol. Denies fevers or chills, denies losing weight. Normal bowel and bladder habits.   A language interpreter was used [place]).  Abdominal Pain      Home Medications Prior to Admission medications   Medication Sig Start Date End Date Taking? Authorizing Provider  omeprazole (PRILOSEC) 20 MG capsule Take 1 capsule (20 mg total) by mouth daily. 09/08/22 10/08/22 Yes Jeannie Fend, PA-C  acetaminophen (TYLENOL) 500 MG tablet Take 1,000 mg by mouth every 6 (six) hours as needed (pain). Patient not taking: Reported on 03/01/2021    [provider]  alum & mag hydroxide-simeth (MAALOX PLUS) 400-400-40 MG/5ML suspension Take 10 mLs by mouth every 6 (six) hours as needed for indigestion. Patient not taking: Reported on 03/01/2021    [provider]  docusate sodium (COLACE) 100 MG capsule Take 100 mg by mouth daily as needed for mild constipation. Patient not taking: Reported on 03/01/2021    [provider]  ibuprofen (ADVIL) 200 MG tablet Take 200-1,000 mg by mouth every 6 (six) hours as needed for moderate pain. Patient not taking: Reported on 03/01/2021    [provider]  Multiple Vitamin (MULTIVITAMIN WITH MINERALS) TABS tablet Take 1 tablet by mouth daily. Patient not taking: Reported on 03/01/2021    [provider]      Allergies    Asa [aspirin], Nsaids, and Peanut-containing drug products    Review of Systems   Review of Systems  Gastrointestinal:  Positive for abdominal pain.   Negative except as per HPI Physical Exam Updated Vital Signs BP (!) 155/99   Pulse 99   Temp 97.9 F (36.6 C)   Resp 17   SpO2 98%  Physical Exam Vitals and nursing note reviewed.  Constitutional:      General: He is not in acute distress.    Appearance: He is well-developed. He is not diaphoretic.  HENT:     Head: Normocephalic and atraumatic.  Cardiovascular:     Rate and Rhythm: Normal rate and regular rhythm.     Heart sounds: Normal heart sounds.  Pulmonary:     Effort: Pulmonary effort is normal.     Breath sounds: Normal breath sounds.  Abdominal:     Palpations: Abdomen is soft.     Tenderness: There is no abdominal tenderness.  Skin:    General: Skin is warm and dry.     Findings: No erythema or rash.  Neurological:     Mental Status: He is alert and oriented to person, place, and time.  Psychiatric:        Behavior: Behavior normal.     ED Results / Procedures / Treatments   Labs (all labs ordered are listed, but only abnormal results are displayed) Labs Reviewed  COMPREHENSIVE METABOLIC  PANEL - Abnormal; Notable for the following components:      Result Value   Glucose, Bld 110 (*)    ALT 49 (*)    All other components within normal limits  RESP PANEL BY RT-PCR (FLU A&B, COVID) ARPGX2  CBC WITH DIFFERENTIAL/PLATELET  LIPASE, BLOOD  CK  URINALYSIS, ROUTINE W REFLEX MICROSCOPIC    EKG None  Radiology No results found.  Procedures Procedures    Medications Ordered in ED Medications - No data to display  ED Course/ Medical Decision Making/ A&P                           Medical Decision Making Risk Prescription drug management.   66 yo male presents with complaint as above. He reports feeling hungry all the time, eats and feels better. Has stomach  pain at times with history of stomach ulcer, previously prescribed Protonix and is out. Also reports body aches. Labs with out significant findings include CBC, CMP, lipase, CK, UA, COVID/flu. He is concerned he may have diabetes, reassured with labs today however tried to explain this is not an A1C. Provided with Omeprazole, recommend recheck with PCP.         Final Clinical Impression(s) / ED Diagnoses Final diagnoses:  Always hungry    Rx / DC Orders ED Discharge Orders          Ordered    omeprazole (PRILOSEC) 20 MG capsule  Daily        09/08/22 0721              Jeannie Fend, PA-C 09/08/22 0725    Ernie Avena, MD 09/08/22 [phone]

## 2022-09-08 NOTE — Discharge Instructions (Signed)
Prilosec as prescribed.  This medication is available over-the-counter as well and can be purchased at [facility] [facility].  Follow-up with your doctor for recheck.   Prilosec comme prescrit. Ce mdicament est galement disponible en vente libre et peut tre achet [employer]. Faites un suivi avec votre mdecin pour [employer] vrification.

## 2023-01-07 ENCOUNTER — Emergency Department (HOSPITAL_COMMUNITY)
Admission: EM | Admit: 2023-01-07 | Discharge: 2023-01-08 | Disposition: A | Discharge status: Home / Self Care | Payer: Self-pay | Attending: Emergency Medicine | Admitting: Emergency Medicine

## 2023-01-07 ENCOUNTER — Other Ambulatory Visit: Payer: Self-pay

## 2023-01-07 DIAGNOSIS — K5900 Constipation, unspecified: Secondary | ICD-10-CM | POA: Insufficient documentation

## 2023-01-07 DIAGNOSIS — U071 COVID-19: Secondary | ICD-10-CM | POA: Insufficient documentation

## 2023-01-08 ENCOUNTER — Emergency Department (HOSPITAL_COMMUNITY): Payer: Self-pay

## 2023-01-08 ENCOUNTER — Encounter (HOSPITAL_COMMUNITY): Payer: Self-pay

## 2023-01-08 ENCOUNTER — Other Ambulatory Visit: Payer: Self-pay

## 2023-01-08 LAB — RESP PANEL BY RT-PCR (RSV, FLU A&B, COVID)  RVPGX2
Influenza A by PCR: NEGATIVE
Influenza B by PCR: NEGATIVE
Resp Syncytial Virus by PCR: NEGATIVE
SARS Coronavirus 2 by RT PCR: POSITIVE — AB

## 2023-01-08 MED ORDER — BENZONATATE 100 MG PO CAPS: 100.0000 mg | ORAL_CAPSULE | Freq: Two times a day (BID) | ORAL | 0 refills | Status: DC | PRN

## 2023-01-08 MED ORDER — MOLNUPIRAVIR EUA 200MG CAPSULE
4.0000 | ORAL_CAPSULE | Freq: Two times a day (BID) | ORAL | 0 refills | Status: AC
Start: 2023-01-08 — End: 2023-01-13

## 2023-01-08 MED ORDER — POLYETHYLENE GLYCOL 3350 17 GM/SCOOP PO POWD: 17.0000 g | Freq: Two times a day (BID) | ORAL | 0 refills | Status: DC

## 2023-01-08 MED ORDER — ACETAMINOPHEN 500 MG PO TABS
1000.0000 mg | ORAL_TABLET | Freq: Once | ORAL | Status: AC
  Administered 2023-01-08: 1000 mg via ORAL
  Filled 2023-01-08: qty 2

## 2023-01-08 NOTE — ED Provider Triage Note (Signed)
Emergency Medicine Provider Triage Evaluation Note  Caleb Rivers , a 67 y.o. male  was evaluated in triage.  Pt complains of body aches, headache, and constipation since yesterday.  Denies fever or sick contacts.  Unsure of last BM.  Review of Systems  Positive: Body aches, headache, constipation Negative: fever  Physical Exam  BP (!) 148/94   Pulse (!) 111   Temp (!) 100.6 F (38.1 C) (Oral)   Resp 16   SpO2 98%   Gen:   Awake, no distress   Resp:  Normal effort  MSK:   Moves extremities without difficulty  Other:    Medical Decision Making  Medically screening exam initiated at 12:08 AM.  Appropriate orders placed.  Caleb Rivers was informed that the remainder of the evaluation will be completed by another provider, this initial triage assessment does not replace that evaluation, and the importance of remaining in the ED until their evaluation is complete.  Cough, body aches, constipation.  Febrile in triage but nontoxic in appearance.  RVP sent, will obtain abdominal series with chest.  Tylenol for fever.   Larene Pickett, PA-C 01/08/23 0010

## 2023-01-08 NOTE — Discharge Instructions (Signed)
Take medications as directed.  Take Tylenol or Motrin for fever.

## 2023-01-08 NOTE — ED Provider Notes (Signed)
[facility] Emergency Department Provider Note MRN:  [id number]  Arrival date & time: 01/08/23     Chief Complaint   Generalized Body Aches   History of Present Illness   Caleb Rivers is a 67 y.o. year-old male presents to the ED with chief complaint of fever and generalized body aches for the past 2 days.  He states that he has cough.  He also reports constipation.    History provided by patient.   Review of Systems  Pertinent positive and negative review of systems noted in HPI.    Physical Exam   Vitals:   01/08/23 0008 01/08/23 0206  BP: (!) 148/94 (!) 168/77  Pulse: (!) 111 (!) 101  Resp: 16 18  Temp: (!) 100.6 F (38.1 C) 99.4 F (37.4 C)  SpO2: 98% 99%    CONSTITUTIONAL:  non toxic-appearing, NAD NEURO:  Alert and oriented x 3, CN 3-12 grossly intact EYES:  eyes equal and reactive ENT/NECK:  Supple, no stridor  CARDIO:  mild tachycardia, regular rhythm, appears well-perfused  PULM:  No respiratory distress, CTAB GI/GU:  non-distended,  MSK/SPINE:  No gross deformities, no edema, moves all extremities  SKIN:  no rash, atraumatic   *Additional and/or pertinent findings included in MDM below  Diagnostic and Interventional Summary    EKG Interpretation  Date/Time:    Ventricular Rate:    PR Interval:    QRS Duration:   QT Interval:    QTC Calculation:   R Axis:     Text Interpretation:         Labs Reviewed  RESP PANEL BY RT-PCR (RSV, FLU A&B, COVID)  RVPGX2 - Abnormal; Notable for the following components:      Result Value   SARS Coronavirus 2 by RT PCR POSITIVE (*)    All other components within normal limits    DG ABD ACUTE 2+V W 1V CHEST  Final Result      Medications  acetaminophen (TYLENOL) tablet 1,000 mg (1,000 mg Oral Given 01/08/23 0013)     Procedures  /  Critical Care Procedures  ED Course and Medical Decision Making  I have reviewed the triage vital signs, the nursing notes, and pertinent  available records from the EMR.  Social Determinants Affecting Complexity of Care: Patient has no clinically significant social determinants affecting this chief complaint..   ED Course:    Medical Decision Making Patient here with flu like symptoms.  COVID test positive.  CXR negative.  No distress on exam.  Not hypoxic.  Will treat with Molnupiravir, tessalon.  Patient also asks for something for constipation.  Problems Addressed: COVID-19: acute illness or injury     Consultants: No consultations were needed in caring for this patient.   Treatment and Plan: Emergency department workup does not suggest an emergent condition requiring admission or immediate intervention beyond  what has been performed at this time. The patient is safe for discharge and has  been instructed to return immediately for worsening symptoms, change in  symptoms or any other concerns    Final Clinical Impressions(s) / ED Diagnoses     ICD-10-CM   1. COVID-19  U07.1     2. Constipation, unspecified constipation type  K59.00       ED Discharge Orders          Ordered    molnupiravir EUA (LAGEVRIO) 200 mg CAPS capsule  2 times daily        01/08/23 0243  benzonatate (TESSALON) 100 MG capsule  2 times daily PRN        01/08/23 0243    polyethylene glycol powder (GLYCOLAX/MIRALAX) 17 GM/SCOOP powder  2 times daily        01/08/23 0243              Discharge Instructions Discussed with and Provided to Patient:     Discharge Instructions      Take medications as directed.  Take Tylenol or Motrin for fever.       Montine Circle, PA-C 01/08/23 0245    Palumbo, April, MD 01/08/23 [id number]

## 2023-01-08 NOTE — ED Triage Notes (Signed)
Generalized body aches, headache and constipation since yesterday.

## 2023-02-24 ENCOUNTER — Inpatient Hospital Stay (HOSPITAL_COMMUNITY)
Admission: EM | Admit: 2023-02-24 | Discharge: 2023-02-27 | DRG: 358 | Disposition: A | Discharge status: Home / Self Care | Payer: Self-pay | Attending: Surgery | Admitting: Surgery

## 2023-02-24 ENCOUNTER — Inpatient Hospital Stay (HOSPITAL_COMMUNITY): Payer: Self-pay

## 2023-02-24 ENCOUNTER — Emergency Department (HOSPITAL_COMMUNITY): Payer: Self-pay

## 2023-02-24 ENCOUNTER — Encounter (HOSPITAL_COMMUNITY): Payer: Self-pay | Admitting: Internal Medicine

## 2023-02-24 ENCOUNTER — Other Ambulatory Visit: Payer: Self-pay

## 2023-02-24 DIAGNOSIS — Z8711 Personal history of peptic ulcer disease: Secondary | ICD-10-CM

## 2023-02-24 DIAGNOSIS — K56600 Partial intestinal obstruction, unspecified as to cause: Principal | ICD-10-CM | POA: Diagnosis present

## 2023-02-24 DIAGNOSIS — Z91010 Allergy to peanuts: Secondary | ICD-10-CM

## 2023-02-24 DIAGNOSIS — Z79899 Other long term (current) drug therapy: Secondary | ICD-10-CM

## 2023-02-24 DIAGNOSIS — K56609 Unspecified intestinal obstruction, unspecified as to partial versus complete obstruction: Principal | ICD-10-CM | POA: Diagnosis present

## 2023-02-24 DIAGNOSIS — Z886 Allergy status to analgesic agent status: Secondary | ICD-10-CM

## 2023-02-24 DIAGNOSIS — E785 Hyperlipidemia, unspecified: Secondary | ICD-10-CM | POA: Diagnosis present

## 2023-02-24 DIAGNOSIS — K5909 Other constipation: Secondary | ICD-10-CM | POA: Diagnosis present

## 2023-02-24 LAB — COMPREHENSIVE METABOLIC PANEL
ALT: 45 U/L — ABNORMAL HIGH (ref 0–44)
AST: 50 U/L — ABNORMAL HIGH (ref 15–41)
Albumin: 4.1 g/dL (ref 3.5–5.0)
Alkaline Phosphatase: 80 U/L (ref 38–126)
Anion gap: 11 (ref 5–15)
BUN: 15 mg/dL (ref 8–23)
CO2: 20 mmol/L — ABNORMAL LOW (ref 22–32)
Calcium: 9.5 mg/dL (ref 8.9–10.3)
Chloride: 104 mmol/L (ref 98–111)
Creatinine, Ser: 1.08 mg/dL (ref 0.61–1.24)
GFR, Estimated: >60 mL/min (ref >60)
Glucose, Bld: 123 mg/dL — ABNORMAL HIGH (ref 70–99)
Potassium: 4.6 mmol/L (ref 3.5–5.1)
Sodium: 135 mmol/L (ref 135–145)
Total Bilirubin: 1.1 mg/dL (ref 0.3–1.2)
Total Protein: 8 g/dL (ref 6.5–8.1)

## 2023-02-24 LAB — CBC
HCT: 49.4 % (ref 39.0–52.0)
Hemoglobin: 16.4 g/dL (ref 13.0–17.0)
MCH: 31.2 pg (ref 26.0–34.0)
MCHC: 33.2 g/dL (ref 30.0–36.0)
MCV: 94.1 fL (ref 80.0–100.0)
Platelets: 225 10*3/uL (ref 150–400)
RBC: 5.25 MIL/uL (ref 4.22–5.81)
RDW: 14.2 % (ref 11.5–15.5)
WBC: 7.3 10*3/uL (ref 4.0–10.5)
nRBC: 0 % (ref 0.0–0.2)

## 2023-02-24 LAB — URINALYSIS, ROUTINE W REFLEX MICROSCOPIC
Bilirubin Urine: NEGATIVE
Glucose, UA: NEGATIVE mg/dL
Ketones, ur: NEGATIVE mg/dL
Leukocytes,Ua: NEGATIVE
Nitrite: NEGATIVE
Protein, ur: NEGATIVE mg/dL
Specific Gravity, Urine: 1.01 (ref 1.005–1.030)
pH: 7 (ref 5.0–8.0)

## 2023-02-24 LAB — URINALYSIS, MICROSCOPIC (REFLEX): Bacteria, UA: NONE SEEN

## 2023-02-24 LAB — TSH: TSH: 1.057 u[IU]/mL (ref 0.350–4.500)

## 2023-02-24 LAB — LIPASE, BLOOD: Lipase: 30 U/L (ref 11–51)

## 2023-02-24 LAB — TROPONIN I (HIGH SENSITIVITY)
Troponin I (High Sensitivity): 5 ng/L (ref <18)
Troponin I (High Sensitivity): 5 ng/L (ref <18)

## 2023-02-24 LAB — HIV ANTIBODY (ROUTINE TESTING W REFLEX): HIV Screen 4th Generation wRfx: NONREACTIVE

## 2023-02-24 MED ORDER — IOHEXOL 350 MG/ML SOLN
75.0000 mL | Freq: Once | INTRAVENOUS | Status: AC | PRN
  Administered 2023-02-24: 75 mL via INTRAVENOUS

## 2023-02-24 MED ORDER — OXYCODONE-ACETAMINOPHEN 5-325 MG PO TABS
1.0000 | ORAL_TABLET | Freq: Once | ORAL | Status: AC
  Administered 2023-02-24: 1 via ORAL
  Filled 2023-02-24: qty 1

## 2023-02-24 MED ORDER — ONDANSETRON HCL 4 MG/2ML IJ SOLN: 4.0000 mg | Freq: Four times a day (QID) | INTRAMUSCULAR | Status: DC | PRN

## 2023-02-24 MED ORDER — ENOXAPARIN SODIUM 40 MG/0.4ML IJ SOSY
40.0000 mg | PREFILLED_SYRINGE | INTRAMUSCULAR | Status: DC
  Administered 2023-02-27: 40 mg via SUBCUTANEOUS
  Filled 2023-02-24: qty 0.4

## 2023-02-24 MED ORDER — METOCLOPRAMIDE HCL 5 MG/ML IJ SOLN
10.0000 mg | Freq: Once | INTRAMUSCULAR | Status: AC
  Administered 2023-02-24: 10 mg via INTRAMUSCULAR
  Filled 2023-02-24: qty 2

## 2023-02-24 MED ORDER — HYDROMORPHONE HCL 1 MG/ML IJ SOLN: 0.5000 mg | INTRAMUSCULAR | Status: DC | PRN

## 2023-02-24 MED ORDER — HYDRALAZINE HCL 20 MG/ML IJ SOLN: 5.0000 mg | Freq: Four times a day (QID) | INTRAMUSCULAR | Status: DC | PRN

## 2023-02-24 MED ORDER — HYDROMORPHONE HCL 1 MG/ML IJ SOLN
1.0000 mg | Freq: Once | INTRAMUSCULAR | Status: AC
  Administered 2023-02-24: 1 mg via INTRAVENOUS
  Filled 2023-02-24: qty 1

## 2023-02-24 MED ORDER — SODIUM CHLORIDE 0.9 % IV SOLN: INTRAVENOUS | Status: AC

## 2023-02-24 MED ORDER — PANTOPRAZOLE SODIUM 40 MG IV SOLR
40.0000 mg | Freq: Every day | INTRAVENOUS | Status: DC
  Administered 2023-02-24 – 2023-02-25 (×2): 40 mg via INTRAVENOUS
  Filled 2023-02-24 (×2): qty 10

## 2023-02-24 MED ORDER — ONDANSETRON 4 MG PO TBDP
4.0000 mg | ORAL_TABLET | Freq: Once | ORAL | Status: AC
  Administered 2023-02-24: 4 mg via ORAL
  Filled 2023-02-24: qty 1

## 2023-02-24 MED ORDER — ONDANSETRON HCL 4 MG PO TABS: 4.0000 mg | ORAL_TABLET | Freq: Four times a day (QID) | ORAL | Status: DC | PRN

## 2023-02-24 NOTE — ED Notes (Signed)
Tech notified this RN that patients NG tube fell out when standing to urinate. This RN to put a new NG tube in and order another chest x-ray.

## 2023-02-24 NOTE — ED Notes (Signed)
ED TO INPATIENT HANDOFF REPORT  ED Nurse Name and Phone #:  [phone]  S Name/Age/Gender Caleb Rivers 67 y.o. male Room/Bed: 019C/019C  Code Status   Code Status: Full Code  Home/SNF/Other Home Patient oriented to: self, place, time, and situation Is this baseline? Yes   Triage Complete: Triage complete  Chief Complaint SBO (small bowel obstruction) ([place]) [id number]  Triage Note Pt reports generalized abdominal pain, n/v since this morning. Pt reports constipation but states had a bowel movement at 0500 today. Actively vomiting multiple times in triage.    Allergies Allergies  Allergen Reactions   Asa [Aspirin] Other (See Comments)    Current and Hx of GI ulcer - per Pt and friend/translator   Nsaids Other (See Comments)    Current and Hx of GI ulcer per pt and friend/translator   Peanut-Containing Drug Products     Pain.      Level of Care/Admitting Diagnosis ED Disposition     ED Disposition  Admit   Condition  --   [place]: [place] [[id number]]  Level of Care: Med-Surg [16]  May admit patient to [facility] or [facility] if equivalent level of care is available:: No  Covid Evaluation: Asymptomatic - no recent exposure (last 10 days) testing not required  Diagnosis: SBO (small bowel obstruction) [facility]) [id number]  Admitting Physician: Lequita Halt [id number]  Attending Physician: Lequita Halt [id number]  Certification:: I certify this patient will need inpatient services for at least 2 midnights  Estimated Length of Stay: 3          B Medical/Surgery History Past Medical History:  Diagnosis Date   Constipation    Gastric ulcer    Ulcer    Past Surgical History:  Procedure Laterality Date   LEFT HEART CATHETERIZATION WITH CORONARY ANGIOGRAM N/A 09/25/2013   Procedure: LEFT HEART CATHETERIZATION WITH CORONARY ANGIOGRAM;  Surgeon: Troy Sine, MD;  Location: [facility] CATH LAB;  Service: Cardiovascular;  Laterality: N/A;      A IV Location/Drains/Wounds Patient Lines/Drains/Airways Status     Active Line/Drains/Airways     Name Placement date Placement time Site Days   Peripheral IV 02/24/23 20 G Anterior;Right Wrist 02/24/23  1617  Wrist  less than 1   NG/OG Vented/Dual Lumen 18 Fr. Right nare 67 cm 02/24/23  1851  Right nare  less than 1            Intake/Output Last 24 hours No intake or output data in the 24 hours ending 02/24/23 1908  Labs/Imaging Results for orders placed or performed during the hospital encounter of 02/24/23 (from the past 48 hour(s))  Lipase, blood     Status: None   Collection Time: 02/24/23  2:51 PM  Result Value Ref Range   Lipase 30 11 - 51 U/L    Comment: Performed at [facility], [place] [address], [place], [place] [address]  Comprehensive metabolic panel     Status: Abnormal   Collection Time: 02/24/23  2:51 PM  Result Value Ref Range   Sodium 135 135 - 145 mmol/L   Potassium 4.6 3.5 - 5.1 mmol/L    Comment: HEMOLYSIS AT THIS LEVEL MAY AFFECT RESULT   Chloride 104 98 - 111 mmol/L   CO2 20 (L) 22 - 32 mmol/L   Glucose, Bld 123 (H) 70 - 99 mg/dL    Comment: Glucose reference range applies only to samples taken after fasting for at least 8 hours.  BUN 15 8 - 23 mg/dL   Creatinine, Ser 1.08 0.61 - 1.24 mg/dL   Calcium 9.5 8.9 - 10.3 mg/dL   Total Protein 8.0 6.5 - 8.1 g/dL   Albumin 4.1 3.5 - 5.0 g/dL   AST 50 (H) 15 - 41 U/L    Comment: HEMOLYSIS AT THIS LEVEL MAY AFFECT RESULT   ALT 45 (H) 0 - 44 U/L    Comment: HEMOLYSIS AT THIS LEVEL MAY AFFECT RESULT   Alkaline Phosphatase 80 38 - 126 U/L   Total Bilirubin 1.1 0.3 - 1.2 mg/dL    Comment: HEMOLYSIS AT THIS LEVEL MAY AFFECT RESULT   GFR, Estimated >60 >60 mL/min    Comment: (NOTE) Calculated using the CKD-EPI Creatinine Equation (2021)    Anion gap 11 5 - 15    Comment: Performed at [facility], [place] [address], [place], [place] [address]  CBC     Status: None   Collection Time:  02/24/23  2:51 PM  Result Value Ref Range   WBC 7.3 4.0 - 10.5 K/uL   RBC 5.25 4.22 - 5.81 MIL/uL   Hemoglobin 16.4 13.0 - 17.0 g/dL   HCT 49.4 39.0 - 52.0 %   MCV 94.1 80.0 - 100.0 fL   MCH 31.2 26.0 - 34.0 pg   MCHC 33.2 30.0 - 36.0 g/dL   RDW 14.2 11.5 - 15.5 %   Platelets 225 150 - 400 K/uL   nRBC 0.0 0.0 - 0.2 %    Comment: Performed at [facility], [place] [address] [place], [place] [address]  Troponin I (High Sensitivity)     Status: None   Collection Time: 02/24/23  2:51 PM  Result Value Ref Range   Troponin I (High Sensitivity) 5 <18 ng/L    Comment: (NOTE) Elevated high sensitivity troponin I (hsTnI) values and significant  changes across serial measurements may suggest ACS but many other  chronic and acute conditions are known to elevate hsTnI results.  Refer to the "Links" section for chest pain algorithms and additional  guidance. Performed at [facility], [place] [address], [place], [place] [address]    CT ABDOMEN PELVIS W CONTRAST  Result Date: 02/24/2023 CLINICAL DATA:  Generalized abdominal pain with nausea, vomiting, and constipation. EXAM: CT ABDOMEN AND PELVIS WITH CONTRAST TECHNIQUE: Multidetector CT imaging of the abdomen and pelvis was performed using the standard protocol following bolus administration of intravenous contrast. RADIATION DOSE REDUCTION: This exam was performed according to the departmental dose-optimization program which includes automated exposure control, adjustment of the mA and/or kV according to patient size and/or use of iterative reconstruction technique. CONTRAST:  30m OMNIPAQUE IOHEXOL 350 MG/ML SOLN COMPARISON:  Abdominal x-ray from same day. CT abdomen pelvis dated September 05, 2019. FINDINGS: Lower chest: No acute abnormality. Hepatobiliary: No focal liver abnormality is seen. No gallstones, gallbladder wall thickening, or biliary dilatation. Pancreas: Unremarkable. No pancreatic ductal dilatation or surrounding inflammatory  changes. Spleen: Normal in size without focal abnormality. Adrenals/Urinary Tract: Adrenal glands are unremarkable. Kidneys are normal, without renal calculi, focal lesion, or hydronephrosis. Bladder is unremarkable. Stomach/Bowel: The stomach is within normal limits. Multiple dilated loops of mid small bowel with transition point in the anterior mid abdomen (series 3, image 51). Focal mural nodular enhancement in the small bowel just proximal to the transition point (series 3, image 56; series 6, image 39). Proximal and distal small bowel are decompressed. The colon is unremarkable. Normal appendix. Vascular/Lymphatic: Aortic atherosclerosis. No enlarged  abdominal or pelvic lymph nodes. Reproductive: Prostate is unremarkable. Other: Trace ascites in the anterior lower pelvis. No pneumoperitoneum. Musculoskeletal: No acute or significant osseous findings. IMPRESSION: 1. High-grade small-bowel obstruction with transition point in the anterior mid abdomen. Focal mural nodular enhancement in the small bowel just proximal to the transition point, concerning for underlying neoplasm. Consider capsule endoscopy for further evaluation. 2.  Aortic Atherosclerosis (ICD10-I70.0). Electronically Signed   By: Titus Dubin M.D.   On: 02/24/2023 17:57   DG Abd 2 Views  Result Date: 02/24/2023 CLINICAL DATA:  Nausea vomiting and abdominal pain.  Constipation EXAM: ABDOMEN - 2 VIEW COMPARISON:  X-ray 01/07/2023 FINDINGS: Moderate colonic stool identified particularly along the right side. Gas seen along nondilated loops of colon. However there is a dilated loop of air-filled small bowel left midabdomen measuring up to 4 cm. No obvious free air beneath the diaphragm on the upright view. There are fluid levels along the stomach in the upright view. No abnormal calcifications. There are some rounded density seen in the pelvis which are indeterminate although possibly vascular IMPRESSION: Dilated small bowel measuring up to 4 cm  with air-fluid levels. Developing or partial obstruction is possible. Recommend postcontrast CT scan to further delineate when appropriate. Moderate colonic stool. Electronically Signed   By: Jill Side M.D.   On: 02/24/2023 14:53    Pending Labs Unresulted Labs (From admission, onward)     Start     Ordered   02/25/23 [id number]  Basic metabolic panel  Tomorrow morning,   R        02/24/23 1857   02/24/23 1857  HIV Antibody (routine testing w rflx)  (HIV Antibody (Routine testing w reflex) panel)  Once,   R        02/24/23 1857   02/24/23 1415  Urinalysis, Routine w reflex microscopic -Urine, Clean Catch  Once,   URGENT       Question:  Specimen Source  Answer:  Urine, Clean Catch   02/24/23 1414            Vitals/Pain Today's Vitals   02/24/23 1412 02/24/23 1413 02/24/23 1606  BP: (!) 169/101  (!) 155/89  Pulse: 96  83  Resp: 20  (!) 21  Temp: 97.9 F (36.6 C)  98 F (36.7 C)  TempSrc: Oral  Oral  SpO2: 99%  98%  PainSc:  10-Worst pain ever     Isolation Precautions No active isolations  Medications Medications  0.9 %  sodium chloride infusion (has no administration in time range)  enoxaparin (LOVENOX) injection 40 mg (has no administration in time range)  HYDROmorphone (DILAUDID) injection 0.5-1 mg (has no administration in time range)  ondansetron (ZOFRAN) tablet 4 mg (has no administration in time range)    Or  ondansetron (ZOFRAN) injection 4 mg (has no administration in time range)  ondansetron (ZOFRAN-ODT) disintegrating tablet 4 mg (4 mg Oral Given 02/24/23 1421)  oxyCODONE-acetaminophen (PERCOCET/ROXICET) 5-325 MG per tablet 1 tablet (1 tablet Oral Given 02/24/23 1421)  metoCLOPramide (REGLAN) injection 10 mg (10 mg Intramuscular Given 02/24/23 1434)  HYDROmorphone (DILAUDID) injection 1 mg (1 mg Intravenous Given 02/24/23 1630)  iohexol (OMNIPAQUE) 350 MG/ML injection 75 mL (75 mLs Intravenous Contrast Given 02/24/23 1731)    Mobility walks     Focused  Assessments    R Recommendations: See Admitting Provider Note  Report given to:   Additional Notes:  Needs french interpretor. Has yet to urinate after multiple times of asking. NG tube in  place 67 cm at the nare. Patient follows commands. Patient has no complained of pain since receiving dilaudid.

## 2023-02-24 NOTE — Consult Note (Signed)
Reason for Consult: SBO Referring Physician: Dr. Regis Bill Caleb Rivers is an 68 y.o. male.  HPI: Patient is a 67 year old male, [name] comes in secondary to abdominal pain, nausea vomiting.  Patient states that he had pain starting today.  He states he did have 1 bout of emesis.  Patient states he has no pain currently.  Patient had NG tube placed.  Upon evaluation the ER underwent CT scan.  This was significant for high-grade bowel obstruction with transition point in the anterior mid abdomen.  There is some small bowel nodular enhancement concerning for underlying neoplasm.  I reviewed the CT scan laboratory studies personally.  Past Medical History:  Diagnosis Date   Constipation    Gastric ulcer    Ulcer     Past Surgical History:  Procedure Laterality Date   LEFT HEART CATHETERIZATION WITH CORONARY ANGIOGRAM N/A 09/25/2013   Procedure: LEFT HEART CATHETERIZATION WITH CORONARY ANGIOGRAM;  Surgeon: Troy Sine, MD;  Location: [facility] CATH LAB;  Service: Cardiovascular;  Laterality: N/A;    Family History  Problem Relation Age of Onset   Diabetes Mellitus II Neg Hx    Cancer Neg Hx     Social History:  reports that he has never smoked. He has never used smokeless tobacco. He reports that he does not drink alcohol and does not use drugs.  Allergies:  Allergies  Allergen Reactions   Asa [Aspirin] Other (See Comments)    Current and Hx of GI ulcer - per Pt and friend/translator   Nsaids Other (See Comments)    Current and Hx of GI ulcer per pt and friend/translator   Peanut-Containing Drug Products     Pain.      Medications: I have reviewed the patient's current medications.  Results for orders placed or performed during the hospital encounter of 02/24/23 (from the past 48 hour(s))  Lipase, blood     Status: None   Collection Time: 02/24/23  2:51 PM  Result Value Ref Range   Lipase 30 11 - 51 U/L    Comment: Performed at [facility], [place]  [address], [place], [place] [address]  Comprehensive metabolic panel     Status: Abnormal   Collection Time: 02/24/23  2:51 PM  Result Value Ref Range   Sodium 135 135 - 145 mmol/L   Potassium 4.6 3.5 - 5.1 mmol/L    Comment: HEMOLYSIS AT THIS LEVEL MAY AFFECT RESULT   Chloride 104 98 - 111 mmol/L   CO2 20 (L) 22 - 32 mmol/L   Glucose, Bld 123 (H) 70 - 99 mg/dL    Comment: Glucose reference range applies only to samples taken after fasting for at least 8 hours.   BUN 15 8 - 23 mg/dL   Creatinine, Ser 1.08 0.61 - 1.24 mg/dL   Calcium 9.5 8.9 - 10.3 mg/dL   Total Protein 8.0 6.5 - 8.1 g/dL   Albumin 4.1 3.5 - 5.0 g/dL   AST 50 (H) 15 - 41 U/L    Comment: HEMOLYSIS AT THIS LEVEL MAY AFFECT RESULT   ALT 45 (H) 0 - 44 U/L    Comment: HEMOLYSIS AT THIS LEVEL MAY AFFECT RESULT   Alkaline Phosphatase 80 38 - 126 U/L   Total Bilirubin 1.1 0.3 - 1.2 mg/dL    Comment: HEMOLYSIS AT THIS LEVEL MAY AFFECT RESULT   GFR, Estimated >60 >60 mL/min    Comment: (NOTE) Calculated using the CKD-EPI Creatinine Equation (2021)    Anion gap  11 5 - 15    Comment: Performed at [facility], [place] [address] [place], [place] [address]  CBC     Status: None   Collection Time: 02/24/23  2:51 PM  Result Value Ref Range   WBC 7.3 4.0 - 10.5 K/uL   RBC 5.25 4.22 - 5.81 MIL/uL   Hemoglobin 16.4 13.0 - 17.0 g/dL   HCT 49.4 39.0 - 52.0 %   MCV 94.1 80.0 - 100.0 fL   MCH 31.2 26.0 - 34.0 pg   MCHC 33.2 30.0 - 36.0 g/dL   RDW 14.2 11.5 - 15.5 %   Platelets 225 150 - 400 K/uL   nRBC 0.0 0.0 - 0.2 %    Comment: Performed at [facility], [place] [address] [place], [place] [address]  Troponin I (High Sensitivity)     Status: None   Collection Time: 02/24/23  2:51 PM  Result Value Ref Range   Troponin I (High Sensitivity) 5 <18 ng/L    Comment: (NOTE) Elevated high sensitivity troponin I (hsTnI) values and significant  changes across serial measurements may suggest ACS but many other  chronic and acute  conditions are known to elevate hsTnI results.  Refer to the "Links" section for chest pain algorithms and additional  guidance. Performed at [facility], [place] [address] [place], [place] [address]   Troponin I (High Sensitivity)     Status: None   Collection Time: 02/24/23  6:08 PM  Result Value Ref Range   Troponin I (High Sensitivity) 5 <18 ng/L    Comment: (NOTE) Elevated high sensitivity troponin I (hsTnI) values and significant  changes across serial measurements may suggest ACS but many other  chronic and acute conditions are known to elevate hsTnI results.  Refer to the "Links" section for chest pain algorithms and additional  guidance. Performed at [facility], [place] [address], [place], [place] [address]     DG Abd Portable 1 View  Result Date: 02/24/2023 CLINICAL DATA:  NG tube placement EXAM: PORTABLE ABDOMEN - 1 VIEW limited for tube placement COMPARISON:  X-ray 02/24/2023 FINDINGS: Limited x-ray of the upper abdomen demonstrates enteric tube with tip overlying the midbody of the stomach. Gas is seen along distended loops of bowel elsewhere in the visualized upper abdomen. Contrast along the renal collecting systems. IMPRESSION: Enteric tube with tip overlying the midbody of the stomach. Limited x-ray for tube placement Electronically Signed   By: Jill Side M.D.   On: 02/24/2023 19:22   CT ABDOMEN PELVIS W CONTRAST  Result Date: 02/24/2023 CLINICAL DATA:  Generalized abdominal pain with nausea, vomiting, and constipation. EXAM: CT ABDOMEN AND PELVIS WITH CONTRAST TECHNIQUE: Multidetector CT imaging of the abdomen and pelvis was performed using the standard protocol following bolus administration of intravenous contrast. RADIATION DOSE REDUCTION: This exam was performed according to the departmental dose-optimization program which includes automated exposure control, adjustment of the mA and/or kV according to patient size and/or use of iterative reconstruction  technique. CONTRAST:  19m OMNIPAQUE IOHEXOL 350 MG/ML SOLN COMPARISON:  Abdominal x-ray from same day. CT abdomen pelvis dated September 05, 2019. FINDINGS: Lower chest: No acute abnormality. Hepatobiliary: No focal liver abnormality is seen. No gallstones, gallbladder wall thickening, or biliary dilatation. Pancreas: Unremarkable. No pancreatic ductal dilatation or surrounding inflammatory changes. Spleen: Normal in size without focal abnormality. Adrenals/Urinary Tract: Adrenal glands are unremarkable. Kidneys are normal, without renal calculi, focal lesion, or hydronephrosis. Bladder is unremarkable. Stomach/Bowel: The  stomach is within normal limits. Multiple dilated loops of mid small bowel with transition point in the anterior mid abdomen (series 3, image 51). Focal mural nodular enhancement in the small bowel just proximal to the transition point (series 3, image 56; series 6, image 39). Proximal and distal small bowel are decompressed. The colon is unremarkable. Normal appendix. Vascular/Lymphatic: Aortic atherosclerosis. No enlarged abdominal or pelvic lymph nodes. Reproductive: Prostate is unremarkable. Other: Trace ascites in the anterior lower pelvis. No pneumoperitoneum. Musculoskeletal: No acute or significant osseous findings. IMPRESSION: 1. High-grade small-bowel obstruction with transition point in the anterior mid abdomen. Focal mural nodular enhancement in the small bowel just proximal to the transition point, concerning for underlying neoplasm. Consider capsule endoscopy for further evaluation. 2.  Aortic Atherosclerosis (ICD10-I70.0). Electronically Signed   By: Titus Dubin M.D.   On: 02/24/2023 17:57   DG Abd 2 Views  Result Date: 02/24/2023 CLINICAL DATA:  Nausea vomiting and abdominal pain.  Constipation EXAM: ABDOMEN - 2 VIEW COMPARISON:  X-ray 01/07/2023 FINDINGS: Moderate colonic stool identified particularly along the right side. Gas seen along nondilated loops of colon. However  there is a dilated loop of air-filled small bowel left midabdomen measuring up to 4 cm. No obvious free air beneath the diaphragm on the upright view. There are fluid levels along the stomach in the upright view. No abnormal calcifications. There are some rounded density seen in the pelvis which are indeterminate although possibly vascular IMPRESSION: Dilated small bowel measuring up to 4 cm with air-fluid levels. Developing or partial obstruction is possible. Recommend postcontrast CT scan to further delineate when appropriate. Moderate colonic stool. Electronically Signed   By: Jill Side M.D.   On: 02/24/2023 14:53    Review of Systems  Constitutional:  Negative for chills and fever.  HENT:  Negative for ear discharge, hearing loss and sore throat.   Eyes:  Negative for discharge.  Respiratory:  Negative for cough and shortness of breath.   Cardiovascular:  Negative for chest pain and leg swelling.  Gastrointestinal:  Negative for abdominal pain, constipation, diarrhea, nausea and vomiting.  Musculoskeletal:  Negative for myalgias and neck pain.  Skin:  Negative for rash.  Allergic/Immunologic: Negative for environmental allergies.  Neurological:  Negative for dizziness and seizures.  Hematological:  Does not bruise/bleed easily.  Psychiatric/Behavioral:  Negative for suicidal ideas.   All other systems reviewed and are negative.  Blood pressure (!) 155/89, pulse 83, temperature 98 F (36.7 C), temperature source Oral, resp. rate (!) 21, SpO2 98 %. Physical Exam Constitutional:      Appearance: He is well-developed.     Comments: Conversant No acute distress  HENT:     Head: Normocephalic and atraumatic.  Eyes:     General: Lids are normal. No scleral icterus.    Pupils: Pupils are equal, round, and reactive to light.     Comments: Pupils are equal round and reactive No lid lag Moist conjunctiva  Neck:     Thyroid: No thyromegaly.     Trachea: No tracheal tenderness.      Comments: No cervical lymphadenopathy Cardiovascular:     Rate and Rhythm: Normal rate and regular rhythm.     Heart sounds: No murmur heard. Pulmonary:     Effort: Pulmonary effort is normal.     Breath sounds: Normal breath sounds. No wheezing or rales.  Abdominal:     Tenderness: There is no abdominal tenderness.     Hernia: No hernia is present.  Musculoskeletal:  Cervical back: Normal range of motion and neck supple.  Skin:    General: Skin is warm.     Findings: No rash.     Nails: There is no clubbing.     Comments: Normal skin turgor  Neurological:     Mental Status: He is alert and oriented to person, place, and time.     Comments: Normal gait and station  Psychiatric:        Mood and Affect: Mood normal.        Thought Content: Thought content normal.        Judgment: Judgment normal.     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  Agree with NG tube placement, n.p.o., IV fluids 2.  Will discuss findings with Dr. Dema Severin in the a.m. in regards to possible diagnostic laparoscopy.  If there is an underlying malignancy this would likely not resolve.  I did discuss this with the patient. 3.  Will follow along.  Caleb Rivers 02/24/2023, 7:34 PM

## 2023-02-24 NOTE — ED Notes (Signed)
Unsuccessful x2 IV

## 2023-02-24 NOTE — H&P (Addendum)
History and Physical    Caleb Rivers [id number] DOB: 10/17/1956 DOA: 02/24/2023  PCP: Kerin Perna, NP (Confirm with patient/family/NH records and if not entered, this has to be entered at [facility] point of entry) Patient coming from: Home  I have personally briefly reviewed patient's old medical records in [place]  Chief Complaint: Feeling better  HPI: Caleb Rivers is a 67 y.o. male with medical history significant of peptic ulcer, chronic constipation, presented with abdominal pain nauseous vomiting.  Patient has history of chronic constipation, this morning patient passed a small hard stools and started to have cramping-like abdominal pain around umbilical area, constant, associated with nauseous vomiting of stomach content greenish, nonbloody.  Denies any fever or chills.  No chest pains. ED Course: Blood pressure elevated, CT abdominal pelvis showed high-grade SBO with transition point in the mid abdomen, focal mural nodule enhancement in the small bowel just to the transition point, suspicious for underlying malignancy.  CBC BMP largely normal.  NGT inserted and general surgeon consulted. Review of Systems: As per HPI otherwise 14 point review of systems negative.    Past Medical History:  Diagnosis Date   Constipation    Gastric ulcer    Ulcer     Past Surgical History:  Procedure Laterality Date   LEFT HEART CATHETERIZATION WITH CORONARY ANGIOGRAM N/A 09/25/2013   Procedure: LEFT HEART CATHETERIZATION WITH CORONARY ANGIOGRAM;  Surgeon: Troy Sine, MD;  Location: [facility] CATH LAB;  Service: Cardiovascular;  Laterality: N/A;     reports that he has never smoked. He has never used smokeless tobacco. He reports that he does not drink alcohol and does not use drugs.  Allergies  Allergen Reactions   Asa [Aspirin] Other (See Comments)    Current and Hx of GI ulcer - per Pt and friend/translator   Nsaids Other (See Comments)    Current and Hx of GI ulcer per  pt and friend/translator   Peanut-Containing Drug Products     Pain.      Family History  Problem Relation Age of Onset   Diabetes Mellitus II Neg Hx    Cancer Neg Hx      Prior to Admission medications   Medication Sig Start Date End Date Taking? Authorizing Provider  acetaminophen (TYLENOL) 500 MG tablet Take 1,000 mg by mouth every 6 (six) hours as needed (pain). Patient not taking: Reported on 03/01/2021    [provider]  alum & mag hydroxide-simeth (MAALOX PLUS) 400-400-40 MG/5ML suspension Take 10 mLs by mouth every 6 (six) hours as needed for indigestion. Patient not taking: Reported on 03/01/2021    [provider]  benzonatate (TESSALON) 100 MG capsule Take 1 capsule (100 mg total) by mouth 2 (two) times daily as needed for cough. 01/08/23   Montine Circle, PA-C  docusate sodium (COLACE) 100 MG capsule Take 100 mg by mouth daily as needed for mild constipation. Patient not taking: Reported on 03/01/2021    [provider]  ibuprofen (ADVIL) 200 MG tablet Take 200-1,000 mg by mouth every 6 (six) hours as needed for moderate pain. Patient not taking: Reported on 03/01/2021    [provider]  Multiple Vitamin (MULTIVITAMIN WITH MINERALS) TABS tablet Take 1 tablet by mouth daily. Patient not taking: Reported on 03/01/2021    [provider]  omeprazole (PRILOSEC) 20 MG capsule Take 1 capsule (20 mg total) by mouth daily. 09/08/22 10/08/22  Suella Broad A, PA-C  polyethylene glycol powder (GLYCOLAX/MIRALAX) 17 GM/SCOOP powder Take  17 g by mouth 2 (two) times daily. Until daily soft stools OTC 01/08/23   Montine Circle, PA-C    Physical Exam: Vitals:   02/24/23 1412 02/24/23 1606  BP: (!) 169/101 (!) 155/89  Pulse: 96 83  Resp: 20 (!) 21  Temp: 97.9 F (36.6 C) 98 F (36.7 C)  TempSrc: Oral Oral  SpO2: 99% 98%    Constitutional: NAD, calm, comfortable Vitals:   02/24/23 1412 02/24/23 1606  BP: (!) 169/101 (!) 155/89  Pulse: 96  83  Resp: 20 (!) 21  Temp: 97.9 F (36.6 C) 98 F (36.7 C)  TempSrc: Oral Oral  SpO2: 99% 98%   Eyes: PERRL, lids and conjunctivae normal ENMT: Mucous membranes are moist. Posterior pharynx clear of any exudate or lesions.Normal dentition.  Neck: normal, supple, no masses, no thyromegaly Respiratory: clear to auscultation bilaterally, no wheezing, no crackles. Normal respiratory effort. No accessory muscle use.  Cardiovascular: Regular rate and rhythm, no murmurs / rubs / gallops. No extremity edema. 2+ pedal pulses. No carotid bruits.  Abdomen: mild tenderness in the periumbilical area, no rebound no guarding, no masses palpated. No hepatosplenomegaly. Bowel sounds increased on all quadrants Musculoskeletal: no clubbing / cyanosis. No joint deformity upper and lower extremities. Good ROM, no contractures. Normal muscle tone.  Skin: no rashes, lesions, ulcers. No induration Neurologic: CN 2-12 grossly intact. Sensation intact, DTR normal. Strength 5/5 in all 4.  Psychiatric: Normal judgment and insight. Alert and oriented x 3. Normal mood.    Labs on Admission: I have personally reviewed following labs and imaging studies  CBC: Recent Labs  Lab 02/24/23 1451  WBC 7.3  HGB 16.4  HCT 49.4  MCV 94.1  PLT 123456   Basic Metabolic Panel: Recent Labs  Lab 02/24/23 1451  NA 135  K 4.6  CL 104  CO2 20*  GLUCOSE 123*  BUN 15  CREATININE 1.08  CALCIUM 9.5   GFR: CrCl cannot be calculated (Unknown ideal weight.). Liver Function Tests: Recent Labs  Lab 02/24/23 1451  AST 50*  ALT 45*  ALKPHOS 80  BILITOT 1.1  PROT 8.0  ALBUMIN 4.1   Recent Labs  Lab 02/24/23 1451  LIPASE 30   No results for input(s): "AMMONIA" in the last 168 hours. Coagulation Profile: No results for input(s): "INR", "PROTIME" in the last 168 hours. Cardiac Enzymes: No results for input(s): "CKTOTAL", "CKMB", "CKMBINDEX", "TROPONINI" in the last 168 hours. BNP (last 3 results) No results for  input(s): "PROBNP" in the last 8760 hours. HbA1C: No results for input(s): "HGBA1C" in the last 72 hours. CBG: No results for input(s): "GLUCAP" in the last 168 hours. Lipid Profile: No results for input(s): "CHOL", "HDL", "LDLCALC", "TRIG", "CHOLHDL", "LDLDIRECT" in the last 72 hours. Thyroid Function Tests: No results for input(s): "TSH", "T4TOTAL", "FREET4", "T3FREE", "THYROIDAB" in the last 72 hours. Anemia Panel: No results for input(s): "VITAMINB12", "FOLATE", "FERRITIN", "TIBC", "IRON", "RETICCTPCT" in the last 72 hours. Urine analysis:    Component Value Date/Time   COLORURINE YELLOW 09/08/2022 0600   APPEARANCEUR CLEAR 09/08/2022 0600   LABSPEC 1.024 09/08/2022 0600   PHURINE 5.0 09/08/2022 0600   GLUCOSEU NEGATIVE 09/08/2022 0600   HGBUR NEGATIVE 09/08/2022 0600   BILIRUBINUR NEGATIVE 09/08/2022 0600   KETONESUR NEGATIVE 09/08/2022 0600   PROTEINUR NEGATIVE 09/08/2022 0600   UROBILINOGEN 0.2 08/30/2014 1922   NITRITE NEGATIVE 09/08/2022 0600   LEUKOCYTESUR NEGATIVE 09/08/2022 0600    Radiological Exams on Admission: CT ABDOMEN PELVIS W CONTRAST  Result Date:  02/24/2023 CLINICAL DATA:  Generalized abdominal pain with nausea, vomiting, and constipation. EXAM: CT ABDOMEN AND PELVIS WITH CONTRAST TECHNIQUE: Multidetector CT imaging of the abdomen and pelvis was performed using the standard protocol following bolus administration of intravenous contrast. RADIATION DOSE REDUCTION: This exam was performed according to the departmental dose-optimization program which includes automated exposure control, adjustment of the mA and/or kV according to patient size and/or use of iterative reconstruction technique. CONTRAST:  26m OMNIPAQUE IOHEXOL 350 MG/ML SOLN COMPARISON:  Abdominal x-ray from same day. CT abdomen pelvis dated September 05, 2019. FINDINGS: Lower chest: No acute abnormality. Hepatobiliary: No focal liver abnormality is seen. No gallstones, gallbladder wall thickening, or  biliary dilatation. Pancreas: Unremarkable. No pancreatic ductal dilatation or surrounding inflammatory changes. Spleen: Normal in size without focal abnormality. Adrenals/Urinary Tract: Adrenal glands are unremarkable. Kidneys are normal, without renal calculi, focal lesion, or hydronephrosis. Bladder is unremarkable. Stomach/Bowel: The stomach is within normal limits. Multiple dilated loops of mid small bowel with transition point in the anterior mid abdomen (series 3, image 51). Focal mural nodular enhancement in the small bowel just proximal to the transition point (series 3, image 56; series 6, image 39). Proximal and distal small bowel are decompressed. The colon is unremarkable. Normal appendix. Vascular/Lymphatic: Aortic atherosclerosis. No enlarged abdominal or pelvic lymph nodes. Reproductive: Prostate is unremarkable. Other: Trace ascites in the anterior lower pelvis. No pneumoperitoneum. Musculoskeletal: No acute or significant osseous findings. IMPRESSION: 1. High-grade small-bowel obstruction with transition point in the anterior mid abdomen. Focal mural nodular enhancement in the small bowel just proximal to the transition point, concerning for underlying neoplasm. Consider capsule endoscopy for further evaluation. 2.  Aortic Atherosclerosis (ICD10-I70.0). Electronically Signed   By: WTitus DubinM.D.   On: 02/24/2023 17:57   DG Abd 2 Views  Result Date: 02/24/2023 CLINICAL DATA:  Nausea vomiting and abdominal pain.  Constipation EXAM: ABDOMEN - 2 VIEW COMPARISON:  X-ray 01/07/2023 FINDINGS: Moderate colonic stool identified particularly along the right side. Gas seen along nondilated loops of colon. However there is a dilated loop of air-filled small bowel left midabdomen measuring up to 4 cm. No obvious free air beneath the diaphragm on the upright view. There are fluid levels along the stomach in the upright view. No abnormal calcifications. There are some rounded density seen in the pelvis  which are indeterminate although possibly vascular IMPRESSION: Dilated small bowel measuring up to 4 cm with air-fluid levels. Developing or partial obstruction is possible. Recommend postcontrast CT scan to further delineate when appropriate. Moderate colonic stool. Electronically Signed   By: AJill SideM.D.   On: 02/24/2023 14:53    EKG: Independently reviewed.  Similar QRS extremities and chest leads especially V1 and V2, no significant acute ST changes.  Assessment/Plan Principal Problem:   SBO (small bowel obstruction) ([place]  (please populate well all problems here in Problem List. (For example, if patient is on BP meds at home and you resume or decide to hold them, it is a problem that needs to be her. Same for CAD, COPD, HLD and so on)  SBO -Recurrent, with no significant Hx of intra-abdominal surgery but with significant chronic constipation.  Symptoms somewhat improved after NGT insertion. -Other etiology, CT implying underlying malignancy and recommend capsule endoscopy.  But have to wonder the possibility of the risk of capsule stuck on the narrowing lumen causing another SBO. Will D/W surgeon while about further work up plan. -IVF, pain meds and zofran PRN.  -GI symptoms improved after  NGT suctioning, no indication for surgical intervention at this point -PPI for GI prophylaxis for now. -Check TSH x1    DVT prophylaxis: Lovenox Code Status: Full code Family Communication: None at bedside Disposition Plan: Expect 2-3 days hospital stay Consults called: Surgeon Admission status: Medsurg admit   Lequita Halt MD [facility] Pager [phone]  02/24/2023, 7:06 PM

## 2023-02-24 NOTE — ED Triage Notes (Addendum)
Pt reports generalized abdominal pain, n/v since this morning. Pt reports constipation but states had a bowel movement at 0500 today. Actively vomiting multiple times in triage.

## 2023-02-24 NOTE — ED Provider Triage Note (Cosign Needed Addendum)
Emergency Medicine Provider Triage Evaluation Note  Caleb Rivers , a 67 y.o. male  was evaluated in triage.  Patient complains of abdominal pain pain, nausea and vomiting that started this morning.  Also endorses constipation.  History of bowel obstruction.  Reports getting small amount of stool out at 5 AM  Review of Systems  Positive:  Negative:   Physical Exam  BP (!) 169/101   Pulse 96   Temp 97.9 F (36.6 C) (Oral)   Resp 20   SpO2 99%  Gen:   Awake, no distress   Resp:  Normal effort  MSK:   Moves extremities without difficulty  Other:  Rigid abdomen  Medical Decision Making  Medically screening exam initiated at 2:14 PM.  Appropriate orders placed.  Caleb Rivers was informed that the remainder of the evaluation will be completed by another provider, this initial triage assessment does not replace that evaluation, and the importance of remaining in the ED until their evaluation is complete.     [name] 02/24/23 1416   Patient continues to actively vomiting in triage despite ODT Zofran.  Given IM Reglan at this time.  He will remain in the triage area.  We have changed his acuity to a 2.  He has a history of bowel obstruction, question the same.  Will go ahead and obtain plain film as he is a hard stick and we were unable to get IV access in the triage area.  We were also unable to get any Caleb Rivers interpreter on the iPad or telephone.  Tried for 30 minutes.  Patient does seem to be understanding of the process and is thankful for the medication.   [name], PA-C 02/24/23 1438

## 2023-02-24 NOTE — ED Provider Notes (Signed)
[facility] Provider Note   CSN: [id number] Arrival date & time: 02/24/23  1405     History  Chief Complaint  Patient presents with   Abdominal Pain   Emesis   Diarrhea    Caleb Rivers is a 67 y.o. male.   Abdominal Pain Associated symptoms: diarrhea and vomiting   Emesis Associated symptoms: abdominal pain and diarrhea   Diarrhea Associated symptoms: abdominal pain and vomiting      Patient is a 67 year old male with gastric ulcer, constipation, history of SBO, CAD presenting to the emergency department due to abdominal pain.  Patient states she had a bowel movement this morning at 5 AM, started having abdominal pain and constipation at 9 AM this morning.  The abdominal pain is constant, is a 5 out of 10, associated with nausea and vomiting.  He has never had abdominal surgery, is not having any chest pain, shortness of breath, loss of consciousness, hematemesis, hematuria, dark and tarry stool or melena.  Home Medications Prior to Admission medications   Medication Sig Start Date End Date Taking? Authorizing Provider  acetaminophen (TYLENOL) 500 MG tablet Take 1,000 mg by mouth every 6 (six) hours as needed (pain). Patient not taking: Reported on 03/01/2021    [provider]  alum & mag hydroxide-simeth (MAALOX PLUS) 400-400-40 MG/5ML suspension Take 10 mLs by mouth every 6 (six) hours as needed for indigestion. Patient not taking: Reported on 03/01/2021    [provider]  benzonatate (TESSALON) 100 MG capsule Take 1 capsule (100 mg total) by mouth 2 (two) times daily as needed for cough. 01/08/23   Montine Circle, PA-C  docusate sodium (COLACE) 100 MG capsule Take 100 mg by mouth daily as needed for mild constipation. Patient not taking: Reported on 03/01/2021    [provider]  ibuprofen (ADVIL) 200 MG tablet Take 200-1,000 mg by mouth every 6 (six) hours as needed for moderate pain. Patient not  taking: Reported on 03/01/2021    [provider]  Multiple Vitamin (MULTIVITAMIN WITH MINERALS) TABS tablet Take 1 tablet by mouth daily. Patient not taking: Reported on 03/01/2021    [provider]  omeprazole (PRILOSEC) 20 MG capsule Take 1 capsule (20 mg total) by mouth daily. 09/08/22 10/08/22  Suella Broad A, PA-C  polyethylene glycol powder (GLYCOLAX/MIRALAX) 17 GM/SCOOP powder Take 17 g by mouth 2 (two) times daily. Until daily soft stools OTC 01/08/23   Montine Circle, PA-C      Allergies    Asa [aspirin], Nsaids, and Peanut-containing drug products    Review of Systems   Review of Systems  Gastrointestinal:  Positive for abdominal pain, diarrhea and vomiting.    Physical Exam Updated Vital Signs BP (!) 166/115   Pulse 94   Temp 98 F (36.7 C) (Oral)   Resp 16   SpO2 100%  Physical Exam Vitals and nursing note reviewed. Exam conducted with a chaperone present.  Constitutional:      Appearance: Normal appearance.  HENT:     Head: Normocephalic and atraumatic.  Eyes:     General: No scleral icterus.       Right eye: No discharge.        Left eye: No discharge.     Extraocular Movements: Extraocular movements intact.     Pupils: Pupils are equal, round, and reactive to light.  Cardiovascular:     Rate and Rhythm: Normal rate and regular rhythm.     Pulses: Normal  pulses.     Heart sounds: Normal heart sounds. No murmur heard.    No friction rub. No gallop.     Comments: Upper and lower extremity pulses are symmetric bilaterally Pulmonary:     Effort: Pulmonary effort is normal. No respiratory distress.     Breath sounds: Normal breath sounds.  Abdominal:     General: Abdomen is flat. Bowel sounds are normal. There is no distension.     Palpations: Abdomen is soft.     Tenderness: There is abdominal tenderness in the right upper quadrant, right lower quadrant and periumbilical area. There is guarding.  Skin:    General: Skin is warm and dry.      Coloration: Skin is not jaundiced.  Neurological:     Mental Status: He is alert. Mental status is at baseline.     Coordination: Coordination normal.     ED Results / Procedures / Treatments   Labs (all labs ordered are listed, but only abnormal results are displayed) Labs Reviewed  COMPREHENSIVE METABOLIC PANEL - Abnormal; Notable for the following components:      Result Value   CO2 20 (*)    Glucose, Bld 123 (*)    AST 50 (*)    ALT 45 (*)    All other components within normal limits  LIPASE, BLOOD  CBC  URINALYSIS, ROUTINE W REFLEX MICROSCOPIC  HIV ANTIBODY (ROUTINE TESTING W REFLEX)  BASIC METABOLIC PANEL  TSH  TROPONIN I (HIGH SENSITIVITY)  TROPONIN I (HIGH SENSITIVITY)    EKG EKG Interpretation  Date/Time:  Tuesday February 24 2023 16:12:53 EST Ventricular Rate:  80 PR Interval:  137 QRS Duration: 91 QT Interval:  360 QTC Calculation: 416 R Axis:   1 Text Interpretation: Sinus rhythm Probable anteroseptal infarct, recent Confirmed by Lacretia Leigh ([id number]) on 02/24/2023 4:19:12 PM  Radiology DG Abd Portable 1 View  Result Date: 02/24/2023 CLINICAL DATA:  NG tube placement EXAM: PORTABLE ABDOMEN - 1 VIEW limited for tube placement COMPARISON:  X-ray 02/24/2023 FINDINGS: Limited x-ray of the upper abdomen demonstrates enteric tube with tip overlying the midbody of the stomach. Gas is seen along distended loops of bowel elsewhere in the visualized upper abdomen. Contrast along the renal collecting systems. IMPRESSION: Enteric tube with tip overlying the midbody of the stomach. Limited x-ray for tube placement Electronically Signed   By: Jill Side M.D.   On: 02/24/2023 19:22   CT ABDOMEN PELVIS W CONTRAST  Result Date: 02/24/2023 CLINICAL DATA:  Generalized abdominal pain with nausea, vomiting, and constipation. EXAM: CT ABDOMEN AND PELVIS WITH CONTRAST TECHNIQUE: Multidetector CT imaging of the abdomen and pelvis was performed using the standard protocol  following bolus administration of intravenous contrast. RADIATION DOSE REDUCTION: This exam was performed according to the departmental dose-optimization program which includes automated exposure control, adjustment of the mA and/or kV according to patient size and/or use of iterative reconstruction technique. CONTRAST:  28m OMNIPAQUE IOHEXOL 350 MG/ML SOLN COMPARISON:  Abdominal x-ray from same day. CT abdomen pelvis dated September 05, 2019. FINDINGS: Lower chest: No acute abnormality. Hepatobiliary: No focal liver abnormality is seen. No gallstones, gallbladder wall thickening, or biliary dilatation. Pancreas: Unremarkable. No pancreatic ductal dilatation or surrounding inflammatory changes. Spleen: Normal in size without focal abnormality. Adrenals/Urinary Tract: Adrenal glands are unremarkable. Kidneys are normal, without renal calculi, focal lesion, or hydronephrosis. Bladder is unremarkable. Stomach/Bowel: The stomach is within normal limits. Multiple dilated loops of mid small bowel with transition point in the anterior mid abdomen (  series 3, image 51). Focal mural nodular enhancement in the small bowel just proximal to the transition point (series 3, image 56; series 6, image 39). Proximal and distal small bowel are decompressed. The colon is unremarkable. Normal appendix. Vascular/Lymphatic: Aortic atherosclerosis. No enlarged abdominal or pelvic lymph nodes. Reproductive: Prostate is unremarkable. Other: Trace ascites in the anterior lower pelvis. No pneumoperitoneum. Musculoskeletal: No acute or significant osseous findings. IMPRESSION: 1. High-grade small-bowel obstruction with transition point in the anterior mid abdomen. Focal mural nodular enhancement in the small bowel just proximal to the transition point, concerning for underlying neoplasm. Consider capsule endoscopy for further evaluation. 2.  Aortic Atherosclerosis (ICD10-I70.0). Electronically Signed   By: Titus Dubin M.D.   On: 02/24/2023  17:57   DG Abd 2 Views  Result Date: 02/24/2023 CLINICAL DATA:  Nausea vomiting and abdominal pain.  Constipation EXAM: ABDOMEN - 2 VIEW COMPARISON:  X-ray 01/07/2023 FINDINGS: Moderate colonic stool identified particularly along the right side. Gas seen along nondilated loops of colon. However there is a dilated loop of air-filled small bowel left midabdomen measuring up to 4 cm. No obvious free air beneath the diaphragm on the upright view. There are fluid levels along the stomach in the upright view. No abnormal calcifications. There are some rounded density seen in the pelvis which are indeterminate although possibly vascular IMPRESSION: Dilated small bowel measuring up to 4 cm with air-fluid levels. Developing or partial obstruction is possible. Recommend postcontrast CT scan to further delineate when appropriate. Moderate colonic stool. Electronically Signed   By: Jill Side M.D.   On: 02/24/2023 14:53    Procedures Procedures    Medications Ordered in ED Medications  0.9 %  sodium chloride infusion ( Intravenous New Bag/Given 02/24/23 1913)  enoxaparin (LOVENOX) injection 40 mg (has no administration in time range)  HYDROmorphone (DILAUDID) injection 0.5-1 mg (has no administration in time range)  ondansetron (ZOFRAN) tablet 4 mg (has no administration in time range)    Or  ondansetron (ZOFRAN) injection 4 mg (has no administration in time range)  hydrALAZINE (APRESOLINE) injection 5 mg (has no administration in time range)  pantoprazole (PROTONIX) injection 40 mg (has no administration in time range)  ondansetron (ZOFRAN-ODT) disintegrating tablet 4 mg (4 mg Oral Given 02/24/23 1421)  oxyCODONE-acetaminophen (PERCOCET/ROXICET) 5-325 MG per tablet 1 tablet (1 tablet Oral Given 02/24/23 1421)  metoCLOPramide (REGLAN) injection 10 mg (10 mg Intramuscular Given 02/24/23 1434)  HYDROmorphone (DILAUDID) injection 1 mg (1 mg Intravenous Given 02/24/23 1630)  iohexol (OMNIPAQUE) 350 MG/ML  injection 75 mL (75 mLs Intravenous Contrast Given 02/24/23 1731)    ED Course/ Medical Decision Making/ A&P                             Medical Decision Making Amount and/or Complexity of Data Reviewed Radiology: ordered.  Risk Prescription drug management. Decision regarding hospitalization.   This is a 67 year old male presenting to the emergency department due to abdominal pain.  Differential includes bowel obstruction, atypical ACS, pancreatitis, cholecystitis.  Upper and extremity pulses are symmetric, his presentation does not appear consistent with a dissection.  He is not having any chest pain so I do not think ACS is as likely.  He is any peritonitic abdomen, will proceed with CT abdomen.  Laboratory workup is unrevealing, he has some mild transaminitis but otherwise unremarkable.  CT abdomen is notable for bowel obstruction with possible concerning underlying neoplasm.  I consulted general surgery  and spoke with Dr. Rosendo Gros who recommends NG tube, which I have ordered.  I consulted with the hospitalist service who agrees with admission.        Final Clinical Impression(s) / ED Diagnoses Final diagnoses:  SBO (small bowel obstruction) [facility])    Rx / DC Orders ED Discharge Orders     None         Sherrill Raring, Hershal Coria 02/24/23 1942    Lacretia Leigh, MD 02/26/23 [phone]

## 2023-02-25 ENCOUNTER — Encounter (HOSPITAL_COMMUNITY): Admission: EM | Disposition: A | Discharge status: Home / Self Care | Payer: Self-pay | Source: Home / Self Care

## 2023-02-25 DIAGNOSIS — K56609 Unspecified intestinal obstruction, unspecified as to partial versus complete obstruction: Secondary | ICD-10-CM

## 2023-02-25 LAB — BASIC METABOLIC PANEL
Anion gap: 7 (ref 5–15)
BUN: 13 mg/dL (ref 8–23)
CO2: 22 mmol/L (ref 22–32)
Calcium: 8.8 mg/dL — ABNORMAL LOW (ref 8.9–10.3)
Chloride: 106 mmol/L (ref 98–111)
Creatinine, Ser: 0.97 mg/dL (ref 0.61–1.24)
GFR, Estimated: >60 mL/min (ref >60)
Glucose, Bld: 105 mg/dL — ABNORMAL HIGH (ref 70–99)
Potassium: 3.9 mmol/L (ref 3.5–5.1)
Sodium: 135 mmol/L (ref 135–145)

## 2023-02-25 LAB — TYPE AND SCREEN
ABO/RH(D): O POS
Antibody Screen: NEGATIVE

## 2023-02-25 SURGERY — EXCISION, SMALL INTESTINE, LAPAROSCOPIC
Anesthesia: General

## 2023-02-25 MED ORDER — CEFAZOLIN SODIUM-DEXTROSE 2-4 GM/100ML-% IV SOLN: 2.0000 g | INTRAVENOUS | Status: DC

## 2023-02-25 MED ORDER — ACETAMINOPHEN 325 MG PO TABS: 650.0000 mg | ORAL_TABLET | Freq: Four times a day (QID) | ORAL | Status: DC | PRN

## 2023-02-25 MED ORDER — IPRATROPIUM-ALBUTEROL 0.5-2.5 (3) MG/3ML IN SOLN: 3.0000 mL | RESPIRATORY_TRACT | Status: DC | PRN

## 2023-02-25 NOTE — Progress Notes (Signed)
Subjective: Patient passing flatus in front of me to show me he doesn't need surgery.  "I do not like surgery" is what he was repeating over and over.  I obtained a [place] interpreter to make sure he was understanding of what we were discussing.  See plan for further details  ROS: See above, otherwise other systems negative  Objective: Vital signs in last 24 hours: Temp:  [97.5 F (36.4 C)-98.8 F (37.1 C)] 98.6 F (37 C) (02/28 0744) Pulse Rate:  [77-100] 78 (02/28 0744) Resp:  [14-21] 18 (02/28 0451) BP: (139-174)/(65-115) 147/88 (02/28 0744) SpO2:  [98 %-100 %] 100 % (02/28 0744) Last BM Date : 02/24/23  Intake/Output from previous day: 02/27 0701 - 02/28 0700 In: 1350 [I.V.:1350] Out: 870 [Urine:650; Emesis/NG output:220] Intake/Output this shift: No intake/output data recorded.  PE: Gen: NAD Abd: soft, NT, minimal NGT output  Lab Results:  Recent Labs    02/24/23 1451  WBC 7.3  HGB 16.4  HCT 49.4  PLT 225   BMET Recent Labs    02/24/23 1451 02/25/23 0046  NA 135 135  K 4.6 3.9  CL 104 106  CO2 20* 22  GLUCOSE 123* 105*  BUN 15 13  CREATININE 1.08 0.97  CALCIUM 9.5 8.8*   PT/INR No results for input(s): "LABPROT", "INR" in the last 72 hours. CMP     Component Value Date/Time   NA 135 02/25/2023 0046   K 3.9 02/25/2023 0046   CL 106 02/25/2023 0046   CO2 22 02/25/2023 0046   GLUCOSE 105 (H) 02/25/2023 0046   BUN 13 02/25/2023 0046   CREATININE 0.97 02/25/2023 0046   CALCIUM 8.8 (L) 02/25/2023 0046   PROT 8.0 02/24/2023 1451   ALBUMIN 4.1 02/24/2023 1451   AST 50 (H) 02/24/2023 1451   ALT 45 (H) 02/24/2023 1451   ALKPHOS 80 02/24/2023 1451   BILITOT 1.1 02/24/2023 1451   GFRNONAA >60 02/25/2023 0046   GFRAA >60 09/19/2020 0804   Lipase     Component Value Date/Time   LIPASE 30 02/24/2023 1451       Studies/Results: DG Abd Portable 1 View  Result Date: 02/24/2023 CLINICAL DATA:  Confirmation of nasogastric tube  positioning. EXAM: PORTABLE ABDOMEN - 1 VIEW COMPARISON:  February 24, 2023 FINDINGS: An enteric tube is seen with its distal tip overlying the body of the stomach. Its distal side hole sits of proximally 8.4 cm distal to the expected region of the gastroesophageal junction. The bowel gas pattern is normal. No radio-opaque calculi or other significant radiographic abnormality are seen. IMPRESSION: Enteric tube positioning, as described above. Electronically Signed   By: Virgina Norfolk M.D.   On: 02/24/2023 20:28   DG Abd Portable 1 View  Result Date: 02/24/2023 CLINICAL DATA:  NG tube placement EXAM: PORTABLE ABDOMEN - 1 VIEW limited for tube placement COMPARISON:  X-ray 02/24/2023 FINDINGS: Limited x-ray of the upper abdomen demonstrates enteric tube with tip overlying the midbody of the stomach. Gas is seen along distended loops of bowel elsewhere in the visualized upper abdomen. Contrast along the renal collecting systems. IMPRESSION: Enteric tube with tip overlying the midbody of the stomach. Limited x-ray for tube placement Electronically Signed   By: Jill Side M.D.   On: 02/24/2023 19:22   CT ABDOMEN PELVIS W CONTRAST  Result Date: 02/24/2023 CLINICAL DATA:  Generalized abdominal pain with nausea, vomiting, and constipation. EXAM: CT ABDOMEN AND PELVIS WITH CONTRAST TECHNIQUE: Multidetector CT imaging of  the abdomen and pelvis was performed using the standard protocol following bolus administration of intravenous contrast. RADIATION DOSE REDUCTION: This exam was performed according to the departmental dose-optimization program which includes automated exposure control, adjustment of the mA and/or kV according to patient size and/or use of iterative reconstruction technique. CONTRAST:  36m OMNIPAQUE IOHEXOL 350 MG/ML SOLN COMPARISON:  Abdominal x-ray from same day. CT abdomen pelvis dated September 05, 2019. FINDINGS: Lower chest: No acute abnormality. Hepatobiliary: No focal liver abnormality is  seen. No gallstones, gallbladder wall thickening, or biliary dilatation. Pancreas: Unremarkable. No pancreatic ductal dilatation or surrounding inflammatory changes. Spleen: Normal in size without focal abnormality. Adrenals/Urinary Tract: Adrenal glands are unremarkable. Kidneys are normal, without renal calculi, focal lesion, or hydronephrosis. Bladder is unremarkable. Stomach/Bowel: The stomach is within normal limits. Multiple dilated loops of mid small bowel with transition point in the anterior mid abdomen (series 3, image 51). Focal mural nodular enhancement in the small bowel just proximal to the transition point (series 3, image 56; series 6, image 39). Proximal and distal small bowel are decompressed. The colon is unremarkable. Normal appendix. Vascular/Lymphatic: Aortic atherosclerosis. No enlarged abdominal or pelvic lymph nodes. Reproductive: Prostate is unremarkable. Other: Trace ascites in the anterior lower pelvis. No pneumoperitoneum. Musculoskeletal: No acute or significant osseous findings. IMPRESSION: 1. High-grade small-bowel obstruction with transition point in the anterior mid abdomen. Focal mural nodular enhancement in the small bowel just proximal to the transition point, concerning for underlying neoplasm. Consider capsule endoscopy for further evaluation. 2.  Aortic Atherosclerosis (ICD10-I70.0). Electronically Signed   By: WTitus DubinM.D.   On: 02/24/2023 17:57   DG Abd 2 Views  Result Date: 02/24/2023 CLINICAL DATA:  Nausea vomiting and abdominal pain.  Constipation EXAM: ABDOMEN - 2 VIEW COMPARISON:  X-ray 01/07/2023 FINDINGS: Moderate colonic stool identified particularly along the right side. Gas seen along nondilated loops of colon. However there is a dilated loop of air-filled small bowel left midabdomen measuring up to 4 cm. No obvious free air beneath the diaphragm on the upright view. There are fluid levels along the stomach in the upright view. No abnormal  calcifications. There are some rounded density seen in the pelvis which are indeterminate although possibly vascular IMPRESSION: Dilated small bowel measuring up to 4 cm with air-fluid levels. Developing or partial obstruction is possible. Recommend postcontrast CT scan to further delineate when appropriate. Moderate colonic stool. Electronically Signed   By: AJill SideM.D.   On: 02/24/2023 14:53    Anti-infectives: Anti-infectives (From admission, onward)    Start     Dose/Rate Route Frequency Ordered Stop   02/25/23 1100  ceFAZolin (ANCEF) IVPB 2g/100 mL premix  Status:  Discontinued        2 g 200 mL/hr over 30 Minutes Intravenous On call to O.R. 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 We discussed that just the NGT itself will not get this obstruction better because there is a physical "thing" causing this blockage.  He states he understands this, but continues to say " I do not like surgery". -he refuses to have surgery today because he states he was told the NGT will get him better.  After our discussion, he is agreeable to talk to his brother to help him decide what  he would like to do.  We discussed having this discussion today and likely proceeding with OR tomorrow if he was agreeable at that time. -cont NGT at this time and NPO  FEN - NPO/NGT/IVFs VTE - Lovenox ID - none needed  Constipation Gastric ulcer  I reviewed hospitalist notes, last 24 h vitals and pain scores, last 48 h intake and output, last 24 h labs and trends, and last 24 h imaging results.   LOS: 1 day    Henreitta Cea , [facility] 02/25/2023, 10:35 AM Please see Amion for pager number during day hours 7:00am-4:30pm or 7:00am -11:30am on weekends

## 2023-02-25 NOTE — TOC Initial Note (Signed)
Transition of Care [facility]) - Initial/Assessment Note    Patient Details  Name: Caleb Rivers MRN: [id number] Date of Birth: [birth year]  Transition of Care [facility] CM/SW Contact:    Cyndi Bender, RN Phone Number: 02/25/2023, 4:06 PM  Clinical Narrative:                 Spoke to patient and niece, [name], regarding insurance and PCP. Patient agreeable for this RNCM to send information to [place] with [facility] returned email stating due to patient's  citizenship status he will not qualify for medicare. Patient will be evaluated for emergency medicaid for this admission.  This RNCM notified [name] of the above.  TOC following for need for MATCH.    Expected Discharge Plan: Home/Self Care Barriers to Discharge: Continued Medical Work up   Patient Goals and CMS Choice Patient states their goals for this hospitalization and ongoing recovery are:: return home          Expected Discharge Plan and Services In-house Referral: Financial Counselor     Living arrangements for the past 2 months: Apartment                                      Prior Living Arrangements/Services Living arrangements for the past 2 months: Apartment   Patient language and need for interpreter reviewed:: Yes Do you feel safe going back to the place where you live?: Yes      Need for Family Participation in Patient Care: Yes (Comment) Care giver support system in place?: Yes (comment)   Criminal Activity/Legal Involvement Pertinent to Current Situation/Hospitalization: No - Comment as needed  Activities of Daily Living Home Assistive Devices/Equipment: None ADL Screening (condition at time of admission) Patient's cognitive ability adequate to safely complete daily activities?: No Is the patient deaf or have difficulty hearing?: No Does the patient have difficulty seeing, even when wearing glasses/contacts?: No Does the patient have difficulty concentrating, remembering, or making decisions?:  No Patient able to express need for assistance with ADLs?: Yes Does the patient have difficulty dressing or bathing?: No Independently performs ADLs?: Yes (appropriate for developmental age) Does the patient have difficulty walking or climbing stairs?: No Weakness of Legs: None Weakness of Arms/Hands: None  Permission Sought/Granted                  Emotional Assessment Appearance:: Appears stated age Attitude/Demeanor/Rapport: Gracious Affect (typically observed): Accepting Orientation: : Oriented to Self, Oriented to Place, Oriented to  Time, Oriented to Situation Alcohol / Substance Use: Not Applicable Psych Involvement: No (comment)  Admission diagnosis:  SBO (small bowel obstruction) ([place]) [K56.609] Patient Active Problem List   Diagnosis Date Noted   History of peptic ulcer disease 09/05/2019   SBO (small bowel obstruction) ([place]) 08/30/2014   Abdominal pain, generalized 08/30/2014   Nausea and vomiting 08/30/2014   Abnormal resting ECG findings - repolarization changes; NOT STEMI 09/26/2013   Gastritis 09/25/2013   PCP:  Kerin Perna, NP Pharmacy:   [facility] [place] [address] Phone: [phone] Fax: [phone]     Social Determinants of Health (SDOH) Social History: [place]: No Food Insecurity (02/24/2023)  Housing: Low Risk  (02/24/2023)  Transportation Needs: No Transportation Needs (02/24/2023)  Utilities: Not At Risk (02/24/2023)  Financial Resource Strain: Medium Risk (09/05/2019)  Physical  Activity: Inactive (09/05/2019)  Social Connections: Unknown (09/05/2019)  Stress: No Stress Concern Present (09/05/2019)  Tobacco Use: Low Risk  (02/24/2023)   SDOH Interventions:     Readmission Risk Interventions     No data to display

## 2023-02-25 NOTE — Progress Notes (Signed)
PROGRESS NOTE        PATIENT DETAILS Name: Caleb Rivers Age: 67 y.o. Sex: male Date of Birth: [birth year] Admit Date: 02/24/2023 Admitting Physician Lequita Halt, MD [id number], [place] Cage, NP  Brief Summary: Patient is a 67 y.o.  male with history of peptic ulcer disease, chronic constipation-presented with 1 day history of abdominal pain/vomiting-found to have SBO and subsequently admitted to the hospitalist service.  Significant events: 2/27>> admit to [facility]  Significant studies: 2/27>> CT abdomen/pelvis: High-grade SBO-transition point in the anterior mid abdomen.  Focal mural nodular enhancement in the small bowel-concerning for underlying neoplasm.  Significant microbiology data: None  Procedures: None  Consults: General surgery  Subjective: Lying comfortably in bed-denies any chest pain or shortness of breath.  No abdominal pain.  Objective: Vitals: Blood pressure (!) 147/88, pulse 78, temperature 98.6 F (37 C), temperature source Oral, resp. rate 18, SpO2 100 %.   Exam: Gen Exam:Alert awake-not in any distress HEENT:atraumatic, normocephalic Chest: B/L clear to auscultation anteriorly CVS:S1S2 regular Abdomen:soft non tender, non distended Extremities:no edema Neurology: Non focal Skin: no rash  Pertinent Labs/Radiology:    Latest Ref Rng & Units 02/24/2023    2:51 PM 09/08/2022    1:06 AM 02/15/2022   12:58 PM  CBC  WBC 4.0 - 10.5 K/uL 7.3  6.8  8.5   Hemoglobin 13.0 - 17.0 g/dL 16.4  16.6  16.5   Hematocrit 39.0 - 52.0 % 49.4  49.3  50.3   Platelets 150 - 400 K/uL 225  223  225     Lab Results  Component Value Date   NA 135 02/25/2023   K 3.9 02/25/2023   CL 106 02/25/2023   CO2 22 02/25/2023      Assessment/Plan: SBO No BM since yesterday 5 AM Has not passed flatus Abdominal exam is benign Continue IVF/NG tube decompression CT abdomen concerning for possible neoplasm as the underlying etiology Awaiting  further recommendations from general surgery  History of peptic ulcer disease PPI  BMI Estimated body mass index is 25.89 kg/m as calculated from the following:   Height as of 03/01/21: '6\' 6"'$  (1.981 m).   Weight as of 03/01/21: 101.6 kg.   Code status:   Code Status: Full Code   DVT Prophylaxis: enoxaparin (LOVENOX) injection 40 mg Start: 02/25/23 1000   Family Communication: None at bedside   Disposition Plan: Status is: Inpatient Remains inpatient appropriate because: Severity of illness   Planned Discharge Destination:Home   Diet: Diet Order             Diet NPO time specified  Diet effective now                     Antimicrobial agents: Anti-infectives (From admission, onward)    None        MEDICATIONS: Scheduled Meds:  enoxaparin (LOVENOX) injection  40 mg Subcutaneous Q24H   pantoprazole (PROTONIX) IV  40 mg Intravenous QHS   Continuous Infusions:  sodium chloride 125 mL/hr at 02/25/23 0255   PRN Meds:.hydrALAZINE, HYDROmorphone (DILAUDID) injection, ondansetron **OR** ondansetron (ZOFRAN) IV   I have personally reviewed following labs and imaging studies  LABORATORY DATA: CBC: Recent Labs  Lab 02/24/23 1451  WBC 7.3  HGB 16.4  HCT 49.4  MCV 94.1  PLT 123456    Basic Metabolic Panel: Recent Labs  Lab 02/24/23 1451 02/25/23 0046  NA 135 135  K 4.6 3.9  CL 104 106  CO2 20* 22  GLUCOSE 123* 105*  BUN 15 13  CREATININE 1.08 0.97  CALCIUM 9.5 8.8*    GFR: CrCl cannot be calculated (Unknown ideal weight.).  Liver Function Tests: Recent Labs  Lab 02/24/23 1451  AST 50*  ALT 45*  ALKPHOS 80  BILITOT 1.1  PROT 8.0  ALBUMIN 4.1   Recent Labs  Lab 02/24/23 1451  LIPASE 30   No results for input(s): "AMMONIA" in the last 168 hours.  Coagulation Profile: No results for input(s): "INR", "PROTIME" in the last 168 hours.  Cardiac Enzymes: No results for input(s): "CKTOTAL", "CKMB", "CKMBINDEX", "TROPONINI" in the last  168 hours.  BNP (last 3 results) No results for input(s): "PROBNP" in the last 8760 hours.  Lipid Profile: No results for input(s): "CHOL", "HDL", "LDLCALC", "TRIG", "CHOLHDL", "LDLDIRECT" in the last 72 hours.  Thyroid Function Tests: Recent Labs    02/24/23 1808  TSH 1.057    Anemia Panel: No results for input(s): "VITAMINB12", "FOLATE", "FERRITIN", "TIBC", "IRON", "RETICCTPCT" in the last 72 hours.  Urine analysis:    Component Value Date/Time   COLORURINE YELLOW 02/24/2023 1951   APPEARANCEUR CLEAR 02/24/2023 1951   LABSPEC 1.010 02/24/2023 1951   PHURINE 7.0 02/24/2023 1951   GLUCOSEU NEGATIVE 02/24/2023 1951   HGBUR TRACE (A) 02/24/2023 1951   BILIRUBINUR NEGATIVE 02/24/2023 1951   KETONESUR NEGATIVE 02/24/2023 1951   PROTEINUR NEGATIVE 02/24/2023 1951   UROBILINOGEN 0.2 08/30/2014 1922   NITRITE NEGATIVE 02/24/2023 1951   LEUKOCYTESUR NEGATIVE 02/24/2023 1951    Sepsis Labs: Lactic Acid, Venous No results found for: "LATICACIDVEN"  MICROBIOLOGY: No results found for this or any previous visit (from the past 240 hour(s)).  RADIOLOGY STUDIES/RESULTS: DG Abd Portable 1 View  Result Date: 02/24/2023 CLINICAL DATA:  Confirmation of nasogastric tube positioning. EXAM: PORTABLE ABDOMEN - 1 VIEW COMPARISON:  February 24, 2023 FINDINGS: An enteric tube is seen with its distal tip overlying the body of the stomach. Its distal side hole sits of proximally 8.4 cm distal to the expected region of the gastroesophageal junction. The bowel gas pattern is normal. No radio-opaque calculi or other significant radiographic abnormality are seen. IMPRESSION: Enteric tube positioning, as described above. Electronically Signed   By: Virgina Norfolk M.D.   On: 02/24/2023 20:28   DG Abd Portable 1 View  Result Date: 02/24/2023 CLINICAL DATA:  NG tube placement EXAM: PORTABLE ABDOMEN - 1 VIEW limited for tube placement COMPARISON:  X-ray 02/24/2023 FINDINGS: Limited x-ray of the upper  abdomen demonstrates enteric tube with tip overlying the midbody of the stomach. Gas is seen along distended loops of bowel elsewhere in the visualized upper abdomen. Contrast along the renal collecting systems. IMPRESSION: Enteric tube with tip overlying the midbody of the stomach. Limited x-ray for tube placement Electronically Signed   By: Jill Side M.D.   On: 02/24/2023 19:22   CT ABDOMEN PELVIS W CONTRAST  Result Date: 02/24/2023 CLINICAL DATA:  Generalized abdominal pain with nausea, vomiting, and constipation. EXAM: CT ABDOMEN AND PELVIS WITH CONTRAST TECHNIQUE: Multidetector CT imaging of the abdomen and pelvis was performed using the standard protocol following bolus administration of intravenous contrast. RADIATION DOSE REDUCTION: This exam was performed according to the departmental dose-optimization program which includes automated exposure control, adjustment of the mA and/or kV according to patient size and/or use of iterative reconstruction technique. CONTRAST:  89m OMNIPAQUE IOHEXOL 350  MG/ML SOLN COMPARISON:  Abdominal x-ray from same day. CT abdomen pelvis dated September 05, 2019. FINDINGS: Lower chest: No acute abnormality. Hepatobiliary: No focal liver abnormality is seen. No gallstones, gallbladder wall thickening, or biliary dilatation. Pancreas: Unremarkable. No pancreatic ductal dilatation or surrounding inflammatory changes. Spleen: Normal in size without focal abnormality. Adrenals/Urinary Tract: Adrenal glands are unremarkable. Kidneys are normal, without renal calculi, focal lesion, or hydronephrosis. Bladder is unremarkable. Stomach/Bowel: The stomach is within normal limits. Multiple dilated loops of mid small bowel with transition point in the anterior mid abdomen (series 3, image 51). Focal mural nodular enhancement in the small bowel just proximal to the transition point (series 3, image 56; series 6, image 39). Proximal and distal small bowel are decompressed. The colon is  unremarkable. Normal appendix. Vascular/Lymphatic: Aortic atherosclerosis. No enlarged abdominal or pelvic lymph nodes. Reproductive: Prostate is unremarkable. Other: Trace ascites in the anterior lower pelvis. No pneumoperitoneum. Musculoskeletal: No acute or significant osseous findings. IMPRESSION: 1. High-grade small-bowel obstruction with transition point in the anterior mid abdomen. Focal mural nodular enhancement in the small bowel just proximal to the transition point, concerning for underlying neoplasm. Consider capsule endoscopy for further evaluation. 2.  Aortic Atherosclerosis (ICD10-I70.0). Electronically Signed   By: Titus Dubin M.D.   On: 02/24/2023 17:57   DG Abd 2 Views  Result Date: 02/24/2023 CLINICAL DATA:  Nausea vomiting and abdominal pain.  Constipation EXAM: ABDOMEN - 2 VIEW COMPARISON:  X-ray 01/07/2023 FINDINGS: Moderate colonic stool identified particularly along the right side. Gas seen along nondilated loops of colon. However there is a dilated loop of air-filled small bowel left midabdomen measuring up to 4 cm. No obvious free air beneath the diaphragm on the upright view. There are fluid levels along the stomach in the upright view. No abnormal calcifications. There are some rounded density seen in the pelvis which are indeterminate although possibly vascular IMPRESSION: Dilated small bowel measuring up to 4 cm with air-fluid levels. Developing or partial obstruction is possible. Recommend postcontrast CT scan to further delineate when appropriate. Moderate colonic stool. Electronically Signed   By: Jill Side M.D.   On: 02/24/2023 14:53     LOS: 1 day   Oren Binet, MD  [facility]    To contact the attending provider between 7A-7P or the covering provider during after hours 7P-7A, please log into the web site [email or web address] and access using universal [place] password for that web site. If you do not have the password, please call the hospital  operator.  02/25/2023, 8:18 AM

## 2023-02-26 ENCOUNTER — Other Ambulatory Visit: Payer: Self-pay

## 2023-02-26 ENCOUNTER — Encounter (HOSPITAL_COMMUNITY): Payer: Self-pay | Admitting: Internal Medicine

## 2023-02-26 ENCOUNTER — Inpatient Hospital Stay (HOSPITAL_COMMUNITY): Payer: Self-pay | Admitting: Certified Registered"

## 2023-02-26 ENCOUNTER — Encounter (HOSPITAL_COMMUNITY): Admission: EM | Disposition: A | Discharge status: Home / Self Care | Payer: Self-pay | Source: Home / Self Care

## 2023-02-26 DIAGNOSIS — K56609 Unspecified intestinal obstruction, unspecified as to partial versus complete obstruction: Secondary | ICD-10-CM

## 2023-02-26 HISTORY — PX: LAPAROSCOPY: SHX197

## 2023-02-26 LAB — COMPREHENSIVE METABOLIC PANEL
ALT: 30 U/L (ref 0–44)
AST: 30 U/L (ref 15–41)
Albumin: 3.4 g/dL — ABNORMAL LOW (ref 3.5–5.0)
Alkaline Phosphatase: 64 U/L (ref 38–126)
Anion gap: 12 (ref 5–15)
BUN: 13 mg/dL (ref 8–23)
CO2: 22 mmol/L (ref 22–32)
Calcium: 9 mg/dL (ref 8.9–10.3)
Chloride: 104 mmol/L (ref 98–111)
Creatinine, Ser: 1.17 mg/dL (ref 0.61–1.24)
GFR, Estimated: >60 mL/min (ref >60)
Glucose, Bld: 84 mg/dL (ref 70–99)
Potassium: 3.9 mmol/L (ref 3.5–5.1)
Sodium: 138 mmol/L (ref 135–145)
Total Bilirubin: 1.1 mg/dL (ref 0.3–1.2)
Total Protein: 7.3 g/dL (ref 6.5–8.1)

## 2023-02-26 LAB — CBC
HCT: 47.4 % (ref 39.0–52.0)
Hemoglobin: 15.7 g/dL (ref 13.0–17.0)
MCH: 31.2 pg (ref 26.0–34.0)
MCHC: 33.1 g/dL (ref 30.0–36.0)
MCV: 94 fL (ref 80.0–100.0)
Platelets: 217 10*3/uL (ref 150–400)
RBC: 5.04 MIL/uL (ref 4.22–5.81)
RDW: 14.2 % (ref 11.5–15.5)
WBC: 7.4 10*3/uL (ref 4.0–10.5)
nRBC: 0 % (ref 0.0–0.2)

## 2023-02-26 SURGERY — LAPAROSCOPY, DIAGNOSTIC
Anesthesia: General

## 2023-02-26 MED ORDER — ONDANSETRON HCL 4 MG/2ML IJ SOLN
INTRAMUSCULAR | Status: AC
  Filled 2023-02-26: qty 2

## 2023-02-26 MED ORDER — ACETAMINOPHEN 500 MG PO TABS
1000.0000 mg | ORAL_TABLET | Freq: Four times a day (QID) | ORAL | Status: DC
  Administered 2023-02-26 – 2023-02-27 (×4): 1000 mg via ORAL
  Filled 2023-02-26 (×4): qty 2

## 2023-02-26 MED ORDER — SUCCINYLCHOLINE CHLORIDE 200 MG/10ML IV SOSY
PREFILLED_SYRINGE | INTRAVENOUS | Status: DC | PRN
  Administered 2023-02-26: 120 mg via INTRAVENOUS

## 2023-02-26 MED ORDER — BUPIVACAINE HCL (PF) 0.25 % IJ SOLN
INTRAMUSCULAR | Status: AC
  Filled 2023-02-26: qty 30

## 2023-02-26 MED ORDER — LIDOCAINE 2% (20 MG/ML) 5 ML SYRINGE
INTRAMUSCULAR | Status: AC
  Filled 2023-02-26: qty 5

## 2023-02-26 MED ORDER — ROCURONIUM BROMIDE 10 MG/ML (PF) SYRINGE
PREFILLED_SYRINGE | INTRAVENOUS | Status: DC | PRN
  Administered 2023-02-26: 60 mg via INTRAVENOUS

## 2023-02-26 MED ORDER — CHLORHEXIDINE GLUCONATE 0.12 % MT SOLN: 15.0000 mL | Freq: Once | OROMUCOSAL | Status: AC

## 2023-02-26 MED ORDER — CHLORHEXIDINE GLUCONATE 0.12 % MT SOLN
OROMUCOSAL | Status: AC
  Administered 2023-02-26: 15 mL via OROMUCOSAL
  Filled 2023-02-26: qty 15

## 2023-02-26 MED ORDER — LACTATED RINGERS IV SOLN: INTRAVENOUS | Status: DC

## 2023-02-26 MED ORDER — PROMETHAZINE HCL 25 MG/ML IJ SOLN: 6.2500 mg | INTRAMUSCULAR | Status: DC | PRN

## 2023-02-26 MED ORDER — OXYCODONE HCL 5 MG/5ML PO SOLN: 5.0000 mg | Freq: Once | ORAL | Status: DC | PRN

## 2023-02-26 MED ORDER — PROPOFOL 10 MG/ML IV BOLUS
INTRAVENOUS | Status: AC
  Filled 2023-02-26: qty 20

## 2023-02-26 MED ORDER — DEXAMETHASONE SODIUM PHOSPHATE 10 MG/ML IJ SOLN
INTRAMUSCULAR | Status: DC | PRN
  Administered 2023-02-26: 10 mg via INTRAVENOUS

## 2023-02-26 MED ORDER — OXYCODONE HCL 5 MG PO TABS: 5.0000 mg | ORAL_TABLET | Freq: Once | ORAL | Status: DC | PRN

## 2023-02-26 MED ORDER — ORAL CARE MOUTH RINSE: 15.0000 mL | Freq: Once | OROMUCOSAL | Status: AC

## 2023-02-26 MED ORDER — PHENYLEPHRINE 80 MCG/ML (10ML) SYRINGE FOR IV PUSH (FOR BLOOD PRESSURE SUPPORT)
PREFILLED_SYRINGE | INTRAVENOUS | Status: DC | PRN
  Administered 2023-02-26 (×2): 160 ug via INTRAVENOUS
  Administered 2023-02-26: 240 ug via INTRAVENOUS

## 2023-02-26 MED ORDER — SUCCINYLCHOLINE CHLORIDE 200 MG/10ML IV SOSY
PREFILLED_SYRINGE | INTRAVENOUS | Status: AC
  Filled 2023-02-26: qty 10

## 2023-02-26 MED ORDER — BUPIVACAINE-EPINEPHRINE 0.25% -1:200000 IJ SOLN
INTRAMUSCULAR | Status: DC | PRN
  Administered 2023-02-26: 20 mL

## 2023-02-26 MED ORDER — LIDOCAINE 2% (20 MG/ML) 5 ML SYRINGE
INTRAMUSCULAR | Status: DC | PRN
  Administered 2023-02-26: 100 mg via INTRAVENOUS

## 2023-02-26 MED ORDER — FENTANYL CITRATE (PF) 100 MCG/2ML IJ SOLN
INTRAMUSCULAR | Status: DC | PRN
  Administered 2023-02-26: 50 ug via INTRAVENOUS
  Administered 2023-02-26: 100 ug via INTRAVENOUS

## 2023-02-26 MED ORDER — SODIUM CHLORIDE 0.9 % IV SOLN: INTRAVENOUS | Status: DC

## 2023-02-26 MED ORDER — PHENYLEPHRINE 80 MCG/ML (10ML) SYRINGE FOR IV PUSH (FOR BLOOD PRESSURE SUPPORT)
PREFILLED_SYRINGE | INTRAVENOUS | Status: AC
  Filled 2023-02-26: qty 10

## 2023-02-26 MED ORDER — PROPOFOL 10 MG/ML IV BOLUS
INTRAVENOUS | Status: DC | PRN
  Administered 2023-02-26: 160 mg via INTRAVENOUS
  Administered 2023-02-26: 20 mg via INTRAVENOUS

## 2023-02-26 MED ORDER — MEPERIDINE HCL 25 MG/ML IJ SOLN: 6.2500 mg | INTRAMUSCULAR | Status: DC | PRN

## 2023-02-26 MED ORDER — SODIUM CHLORIDE 0.9 % IV SOLN
2.0000 g | INTRAVENOUS | Status: AC
  Administered 2023-02-26: 2 g via INTRAVENOUS
  Filled 2023-02-26: qty 2

## 2023-02-26 MED ORDER — MIDAZOLAM HCL 2 MG/2ML IJ SOLN
INTRAMUSCULAR | Status: AC
  Filled 2023-02-26: qty 2

## 2023-02-26 MED ORDER — DEXAMETHASONE SODIUM PHOSPHATE 10 MG/ML IJ SOLN
INTRAMUSCULAR | Status: AC
  Filled 2023-02-26: qty 1

## 2023-02-26 MED ORDER — OXYCODONE HCL 5 MG PO TABS
5.0000 mg | ORAL_TABLET | ORAL | Status: DC | PRN
  Administered 2023-02-27: 5 mg via ORAL
  Filled 2023-02-26: qty 1

## 2023-02-26 MED ORDER — SUGAMMADEX SODIUM 200 MG/2ML IV SOLN
INTRAVENOUS | Status: DC | PRN
  Administered 2023-02-26: 200 mg via INTRAVENOUS

## 2023-02-26 MED ORDER — FENTANYL CITRATE (PF) 250 MCG/5ML IJ SOLN
INTRAMUSCULAR | Status: AC
  Filled 2023-02-26: qty 5

## 2023-02-26 MED ORDER — MIDAZOLAM HCL 2 MG/2ML IJ SOLN
INTRAMUSCULAR | Status: DC | PRN
  Administered 2023-02-26: 2 mg via INTRAVENOUS

## 2023-02-26 MED ORDER — HYDROMORPHONE HCL 1 MG/ML IJ SOLN
0.5000 mg | INTRAMUSCULAR | Status: DC | PRN
  Administered 2023-02-26: 0.5 mg via INTRAVENOUS
  Filled 2023-02-26: qty 0.5

## 2023-02-26 MED ORDER — ONDANSETRON HCL 4 MG/2ML IJ SOLN
INTRAMUSCULAR | Status: DC | PRN
  Administered 2023-02-26: 4 mg via INTRAVENOUS

## 2023-02-26 MED ORDER — HYDROMORPHONE HCL 1 MG/ML IJ SOLN: 0.2500 mg | INTRAMUSCULAR | Status: DC | PRN

## 2023-02-26 MED ORDER — AMISULPRIDE (ANTIEMETIC) 5 MG/2ML IV SOLN: 10.0000 mg | Freq: Once | INTRAVENOUS | Status: DC | PRN

## 2023-02-26 MED ORDER — ROCURONIUM BROMIDE 10 MG/ML (PF) SYRINGE
PREFILLED_SYRINGE | INTRAVENOUS | Status: AC
  Filled 2023-02-26: qty 10

## 2023-02-26 SURGICAL SUPPLY — 59 items
APPLIER CLIP 5 13 M/L LIGAMAX5 (MISCELLANEOUS)
APPLIER CLIP ROT 10 11.4 M/L (STAPLE)
BAG COUNTER SPONGE SURGICOUNT (BAG) ×1 IMPLANT
BLADE CLIPPER SURG (BLADE) IMPLANT
CANISTER SUCT 3000ML PPV (MISCELLANEOUS) ×1 IMPLANT
CHLORAPREP W/TINT 26 (MISCELLANEOUS) ×1 IMPLANT
CLIP APPLIE 5 13 M/L LIGAMAX5 (MISCELLANEOUS) IMPLANT
CLIP APPLIE ROT 10 11.4 M/L (STAPLE) IMPLANT
COVER SURGICAL LIGHT HANDLE (MISCELLANEOUS) ×1 IMPLANT
DERMABOND ADVANCED .7 DNX12 (GAUZE/BANDAGES/DRESSINGS) ×1 IMPLANT
DERMABOND ADVANCED .7 DNX6 (GAUZE/BANDAGES/DRESSINGS) IMPLANT
DRAPE LAPAROSCOPIC ABDOMINAL (DRAPES) ×1 IMPLANT
DRAPE WARM FLUID 44X44 (DRAPES) ×1 IMPLANT
ELECT BLADE 6.5 EXT (BLADE) IMPLANT
ELECT REM PT RETURN 9FT ADLT (ELECTROSURGICAL) ×1
ELECTRODE REM PT RTRN 9FT ADLT (ELECTROSURGICAL) ×1 IMPLANT
GLOVE BIO SURGEON STRL SZ7.5 (GLOVE) ×1 IMPLANT
GLOVE INDICATOR 8.0 STRL GRN (GLOVE) ×1 IMPLANT
GOWN STRL REUS W/ TWL LRG LVL3 (GOWN DISPOSABLE) ×2 IMPLANT
GOWN STRL REUS W/ TWL XL LVL3 (GOWN DISPOSABLE) ×1 IMPLANT
GOWN STRL REUS W/TWL LRG LVL3 (GOWN DISPOSABLE) ×2
GOWN STRL REUS W/TWL XL LVL3 (GOWN DISPOSABLE) ×2 IMPLANT
HANDLE SUCTION POOLE (INSTRUMENTS) ×1 IMPLANT
IRRIG SUCT STRYKERFLOW 2 WTIP (MISCELLANEOUS)
IRRIGATION SUCT STRKRFLW 2 WTP (MISCELLANEOUS) IMPLANT
KIT BASIN OR (CUSTOM PROCEDURE TRAY) ×1 IMPLANT
KIT TURNOVER KIT B (KITS) ×1 IMPLANT
LIGASURE IMPACT 36 18CM CVD LR (INSTRUMENTS) IMPLANT
NS IRRIG 1000ML POUR BTL (IV SOLUTION) ×2 IMPLANT
PACK GENERAL/GYN (CUSTOM PROCEDURE TRAY) ×1 IMPLANT
PAD ARMBOARD 7.5X6 YLW CONV (MISCELLANEOUS) ×2 IMPLANT
PENCIL SMOKE EVACUATOR (MISCELLANEOUS) ×1 IMPLANT
SCISSORS LAP 5X35 DISP (ENDOMECHANICALS) IMPLANT
SET TUBE SMOKE EVAC HIGH FLOW (TUBING) ×1 IMPLANT
SHEARS HARMONIC ACE PLUS 36CM (ENDOMECHANICALS) IMPLANT
SLEEVE ADV FIXATION 5X100MM (TROCAR) IMPLANT
SLEEVE Z-THREAD 5X100MM (TROCAR) IMPLANT
SPECIMEN JAR LARGE (MISCELLANEOUS) IMPLANT
SPIKE FLUID TRANSFER (MISCELLANEOUS) ×1 IMPLANT
SPONGE T-LAP 18X18 ~~LOC~~+RFID (SPONGE) IMPLANT
STAPLER VISISTAT 35W (STAPLE) ×1 IMPLANT
SUCTION POOLE HANDLE (INSTRUMENTS) ×1
SUT MNCRL AB 4-0 PS2 18 (SUTURE) ×1 IMPLANT
SUT PDS AB 1 TP1 96 (SUTURE) ×2 IMPLANT
SUT SILK 2 0 SH CR/8 (SUTURE) ×1 IMPLANT
SUT SILK 2 0 TIES 10X30 (SUTURE) ×1 IMPLANT
SUT SILK 3 0 SH CR/8 (SUTURE) ×1 IMPLANT
SUT SILK 3 0 TIES 10X30 (SUTURE) ×1 IMPLANT
TOWEL GREEN STERILE (TOWEL DISPOSABLE) ×1 IMPLANT
TOWEL GREEN STERILE FF (TOWEL DISPOSABLE) ×1 IMPLANT
TRAY FOLEY MTR SLVR 16FR STAT (SET/KITS/TRAYS/PACK) ×1 IMPLANT
TRAY FOLEY SLVR 16FR LF STAT (SET/KITS/TRAYS/PACK) IMPLANT
TRAY LAPAROSCOPIC MC (CUSTOM PROCEDURE TRAY) ×1 IMPLANT
TROCAR 11X100 Z THREAD (TROCAR) IMPLANT
TROCAR ADV FIXATION 12X100MM (TROCAR) IMPLANT
TROCAR ADV FIXATION 5X100MM (TROCAR) IMPLANT
TROCAR BALLN 12MMX100 BLUNT (TROCAR) IMPLANT
TROCAR Z-THREAD OPTICAL 5X100M (TROCAR) IMPLANT
YANKAUER SUCT BULB TIP NO VENT (SUCTIONS) IMPLANT

## 2023-02-26 NOTE — Progress Notes (Signed)
Day of Surgery  Subjective: Feeling reasonably well. Remains motivated to pursue surgery. No n/v  ROS: See above, otherwise other systems negative  Objective: Vital signs in last 24 hours: Temp:  [98 F (36.7 C)-98.9 F (37.2 C)] 98.9 F (37.2 C) (02/29 0807) Pulse Rate:  [77-91] 89 (02/29 0807) Resp:  [16-18] 18 (02/29 0807) BP: (153-160)/(76-104) 160/98 (02/29 0807) SpO2:  [94 %-100 %] 100 % (02/29 0807) Weight:  [103.2 kg] 103.2 kg (02/28 1129) Last BM Date : 02/25/23  Intake/Output from previous day: 02/28 0701 - 02/29 0700 In: 770.6 [I.V.:680.6; NG/GT:90] Out: 2200 [Urine:1350; Emesis/NG output:850] Intake/Output this shift: No intake/output data recorded.  PE: Gen: NAD Abd: soft, nontender, nondistended   Lab Results:  Recent Labs    02/24/23 1451 02/26/23 0541  WBC 7.3 7.4  HGB 16.4 15.7  HCT 49.4 47.4  PLT 225 217   BMET Recent Labs    02/25/23 0046 02/26/23 0541  NA 135 138  K 3.9 3.9  CL 106 104  CO2 22 22  GLUCOSE 105* 84  BUN 13 13  CREATININE 0.97 1.17  CALCIUM 8.8* 9.0   PT/INR No results for input(s): "LABPROT", "INR" in the last 72 hours. CMP     Component Value Date/Time   NA 138 02/26/2023 0541   K 3.9 02/26/2023 0541   CL 104 02/26/2023 0541   CO2 22 02/26/2023 0541   GLUCOSE 84 02/26/2023 0541   BUN 13 02/26/2023 0541   CREATININE 1.17 02/26/2023 0541   CALCIUM 9.0 02/26/2023 0541   PROT 7.3 02/26/2023 0541   ALBUMIN 3.4 (L) 02/26/2023 0541   AST 30 02/26/2023 0541   ALT 30 02/26/2023 0541   ALKPHOS 64 02/26/2023 0541   BILITOT 1.1 02/26/2023 0541   GFRNONAA >60 02/26/2023 0541   GFRAA >60 09/19/2020 0804   Lipase     Component Value Date/Time   LIPASE 30 02/24/2023 1451    Studies/Results: DG Abd Portable 1 View  Result Date: 02/24/2023 CLINICAL DATA:  Confirmation of nasogastric tube positioning. EXAM: PORTABLE ABDOMEN - 1 VIEW COMPARISON:  February 24, 2023 FINDINGS: An enteric tube is seen with its  distal tip overlying the body of the stomach. Its distal side hole sits of proximally 8.4 cm distal to the expected region of the gastroesophageal junction. The bowel gas pattern is normal. No radio-opaque calculi or other significant radiographic abnormality are seen. IMPRESSION: Enteric tube positioning, as described above. Electronically Signed   By: Virgina Norfolk M.D.   On: 02/24/2023 20:28   DG Abd Portable 1 View  Result Date: 02/24/2023 CLINICAL DATA:  NG tube placement EXAM: PORTABLE ABDOMEN - 1 VIEW limited for tube placement COMPARISON:  X-ray 02/24/2023 FINDINGS: Limited x-ray of the upper abdomen demonstrates enteric tube with tip overlying the midbody of the stomach. Gas is seen along distended loops of bowel elsewhere in the visualized upper abdomen. Contrast along the renal collecting systems. IMPRESSION: Enteric tube with tip overlying the midbody of the stomach. Limited x-ray for tube placement Electronically Signed   By: Jill Side M.D.   On: 02/24/2023 19:22   CT ABDOMEN PELVIS W CONTRAST  Result Date: 02/24/2023 CLINICAL DATA:  Generalized abdominal pain with nausea, vomiting, and constipation. EXAM: CT ABDOMEN AND PELVIS WITH CONTRAST TECHNIQUE: Multidetector CT imaging of the abdomen and pelvis was performed using the standard protocol following bolus administration of intravenous contrast. RADIATION DOSE REDUCTION: This exam was performed according to the departmental dose-optimization program which includes automated  exposure control, adjustment of the mA and/or kV according to patient size and/or use of iterative reconstruction technique. CONTRAST:  65m OMNIPAQUE IOHEXOL 350 MG/ML SOLN COMPARISON:  Abdominal x-ray from same day. CT abdomen pelvis dated September 05, 2019. FINDINGS: Lower chest: No acute abnormality. Hepatobiliary: No focal liver abnormality is seen. No gallstones, gallbladder wall thickening, or biliary dilatation. Pancreas: Unremarkable. No pancreatic ductal  dilatation or surrounding inflammatory changes. Spleen: Normal in size without focal abnormality. Adrenals/Urinary Tract: Adrenal glands are unremarkable. Kidneys are normal, without renal calculi, focal lesion, or hydronephrosis. Bladder is unremarkable. Stomach/Bowel: The stomach is within normal limits. Multiple dilated loops of mid small bowel with transition point in the anterior mid abdomen (series 3, image 51). Focal mural nodular enhancement in the small bowel just proximal to the transition point (series 3, image 56; series 6, image 39). Proximal and distal small bowel are decompressed. The colon is unremarkable. Normal appendix. Vascular/Lymphatic: Aortic atherosclerosis. No enlarged abdominal or pelvic lymph nodes. Reproductive: Prostate is unremarkable. Other: Trace ascites in the anterior lower pelvis. No pneumoperitoneum. Musculoskeletal: No acute or significant osseous findings. IMPRESSION: 1. High-grade small-bowel obstruction with transition point in the anterior mid abdomen. Focal mural nodular enhancement in the small bowel just proximal to the transition point, concerning for underlying neoplasm. Consider capsule endoscopy for further evaluation. 2.  Aortic Atherosclerosis (ICD10-I70.0). Electronically Signed   By: WTitus DubinM.D.   On: 02/24/2023 17:57   DG Abd 2 Views  Result Date: 02/24/2023 CLINICAL DATA:  Nausea vomiting and abdominal pain.  Constipation EXAM: ABDOMEN - 2 VIEW COMPARISON:  X-ray 01/07/2023 FINDINGS: Moderate colonic stool identified particularly along the right side. Gas seen along nondilated loops of colon. However there is a dilated loop of air-filled small bowel left midabdomen measuring up to 4 cm. No obvious free air beneath the diaphragm on the upright view. There are fluid levels along the stomach in the upright view. No abnormal calcifications. There are some rounded density seen in the pelvis which are indeterminate although possibly vascular IMPRESSION:  Dilated small bowel measuring up to 4 cm with air-fluid levels. Developing or partial obstruction is possible. Recommend postcontrast CT scan to further delineate when appropriate. Moderate colonic stool. Electronically Signed   By: AJill SideM.D.   On: 02/24/2023 14:53    Anti-infectives: Anti-infectives (From admission, onward)    Start     Dose/Rate Route Frequency Ordered Stop   02/25/23 1100  ceFAZolin (ANCEF) IVPB 2g/100 mL premix  Status:  Discontinued        2 g 200 mL/hr over 30 Minutes Intravenous On call to O.R. 02/25/23 1008 02/25/23 1035        Assessment/Plan pSBO secondary to possible small bowel mass  We discussed his CT findings and the fact that he has had no abdominal surgical history. The obstruction evident on CT may have a neoplasm as a lead point although somewhat subtle. Given this constellation of findings, we have recommended diagnostic laparoscopy with possible bowel resection based on intraoperative findings.    -The anatomy and physiology of the GI tract was discussed. The pathophysiology of bowel obstructions was discussed as well. -We reviewed diagnostic laparoscopy, possible bowel resection based on intraoperative findings. -The planned procedures, material risks (including, but not limited to, pain, bleeding, infection, scarring, need for blood transfusion, damage to surrounding structures- blood vessels/nerves/viscus/organs, leak from anastomosis, need for additional procedures, worsening of pre-existing medical conditions, hernia, recurrence, pneumonia, heart attack, stroke, death) benefits and alternatives to surgery  were discussed at length. The patient's questions were answered to his satisfaction, he voiced understanding and he and his brother have elected to proceed with surgery. Additionally, we discussed typical postoperative expectations and the recovery process.  FEN - NPO/NGT/IVFs VTE - Lovenox ID - none needed  Constipation Gastric  ulcer  I reviewed hospitalist notes, last 24 h vitals and pain scores, last 48 h intake and output, last 24 h labs and trends, and last 24 h imaging results.  I spent a total of 40 minutes in both face-to-face and non-face-to-face activities, excluding procedures performed, for this visit on the date of this encounter.    LOS: 2 days   Nadeen Landau, MD [facility], [employer]

## 2023-02-26 NOTE — Plan of Care (Signed)

## 2023-02-26 NOTE — Op Note (Signed)
02/24/2023 - 02/26/2023  10:26 AM  PATIENT:  Caleb Rivers  67 y.o. male  Patient Care Team: Kerin Perna, NP as PCP - General (Internal Medicine)  PRE-OPERATIVE DIAGNOSIS:  Small bowel obstruction with concern for small bowel neoplasm  POST-OPERATIVE DIAGNOSIS:  Resolved small bowel obstruction, no evident neoplasm  PROCEDURE:  Diagnostic laparoscopy  SURGEON:  Sharon Mt. Dema Severin, MD  ASSISTANT: Ewell Poe, RNFA  ANESTHESIA:   local and general  COUNTS:  Sponge, needle and instrument counts were reported correct x2 at the conclusion of the operation.  EBL: 10 mL  DRAINS: None  SPECIMEN: None  COMPLICATIONS: None  FINDINGS: Normal appearing stomach, liver and gallbladder. Normal appendix and colon. No evidence of inflammation/diverticulitis. Normal appearing small bowel without any thickening, inflammation or mass. Normal omentum.  DISPOSITION: PACU in satisfactory condition  DESCRIPTION: The patient was identified in preop holding and taken to the OR where he was placed on the operating room table. SCDs were placed. General endotracheal anesthesia was induced without difficulty. He was then prepped and draped in the usual sterile fashion. A surgical timeout was performed indicating the correct patient, procedure, positioning and need for preoperative antibiotics.   Quarter percent Marcaine was infiltrated at the supraumbilical site.  The skin is incised.  Subcutaneous tissue divided with cautery.  The fascia is identified.  This is then incised sharply after retracting the umbilicus outward with a Kocher clamp.  The peritoneum was then cautiously entered bluntly.  A 0 Vicryl pursestring stitch is then placed through the fascia.  At this location, a 12 mm Hassan cannula is placed.  Pneumoperitoneum established with CO2 to a max pressure of 15 mmHg.  The laparoscope was inserted and demonstrates no evidence of trocar site complication.  The abdomen is surveyed.  There  are no significant adhesions to the abdominal wall.  2 additional 5 mm working ports then placed in the lateral abdomen on the right side under direct visualization.  He is then positioned in Trendelenburg.  We began by evaluating the right lower quadrant.  The appendix is identified and normal in appearance.  We then began by running the small bowel back from the terminal ileum all the way back to the ligament of Treitz.  There are various areas of mildly dilated/gas-filled loops of small bowel but there is no evident obstruction.  The mesentery is normal.  The serosal surfaces are normal.  There is no inflammation.  The bowel was then run a second time gently compressing the wall of the small bowel and of the throat to identify any sort of mass and none are identified.  He is then positioned again in the flat supine position.  The colon is inspected and normal in its intra-abdominal portion.  There is no wall thickening or inflammation.  The stomach is decompressed and normal in appearance.  The omentum is normal in appearance.  The liver and gallbladder are normal.  There is no evidence of any intra-abdominal pathology.  The abdomen is inspected and hemostasis noted.  The 5 mm trocars were removed under direct visualization after pneumoperitoneum was exhausted.  They are hemostatic.  The 12 mm Hassan cannula was removed.  All sponge, needle, and instrument counts were reported correct.  The fascial defect at the umbilical site is then obliterated by tying the pursestring.  The fascia is palpated noted to be completely closed.  The skin of all incision sites is closed using 4-0 Monocryl subcuticular suture followed by Dermabond.  He is  awakened from anesthesia, extubated, transported to recovery in stable condition.

## 2023-02-26 NOTE — Anesthesia Preprocedure Evaluation (Signed)
Anesthesia Evaluation  Patient identified by MRN, date of birth, ID band Patient awake    Reviewed: Allergy & Precautions, H&P , NPO status , Patient's Chart, lab work & pertinent test results  Airway Mallampati: II  TM Distance: >3 FB Neck ROM: Full    Dental no notable dental hx.    Pulmonary neg pulmonary ROS   Pulmonary exam normal breath sounds clear to auscultation       Cardiovascular negative cardio ROS Normal cardiovascular exam Rhythm:Regular Rate:Normal     Neuro/Psych negative neurological ROS  negative psych ROS   GI/Hepatic Neg liver ROS, PUD,,,  Endo/Other  negative endocrine ROS    Renal/GU negative Renal ROS  negative genitourinary   Musculoskeletal negative musculoskeletal ROS (+)    Abdominal  (+) + obese  Peds negative pediatric ROS (+)  Hematology negative hematology ROS (+)   Anesthesia Other Findings   Reproductive/Obstetrics negative OB ROS                             Anesthesia Physical Anesthesia Plan  ASA: 2  Anesthesia Plan: General   Post-op Pain Management: Dilaudid IV   Induction: Intravenous  PONV Risk Score and Plan: 2 and Ondansetron, Midazolam and Treatment may vary due to age or medical condition  Airway Management Planned: Oral ETT  Additional Equipment:   Intra-op Plan:   Post-operative Plan: Extubation in OR  Informed Consent: I have reviewed the patients History and Physical, chart, labs and discussed the procedure including the risks, benefits and alternatives for the proposed anesthesia with the patient or authorized representative who has indicated his/her understanding and acceptance.     Dental advisory given  Plan Discussed with: CRNA  Anesthesia Plan Comments:        Anesthesia Quick Evaluation

## 2023-02-26 NOTE — Anesthesia Procedure Notes (Signed)
Procedure Name: Intubation Date/Time: 02/26/2023 9:31 AM  Performed by: Barrington Ellison, CRNAPre-anesthesia Checklist: Patient identified, Emergency Drugs available, Suction available and Patient being monitored Patient Re-evaluated:Patient Re-evaluated prior to induction Oxygen Delivery Method: Circle System Utilized Preoxygenation: Pre-oxygenation with 100% oxygen Induction Type: IV induction, Rapid sequence and Cricoid Pressure applied Laryngoscope Size: Mac and 4 Grade View: Grade I Tube type: Oral Tube size: 7.5 mm Number of attempts: 1 Airway Equipment and Method: Stylet and Oral airway Placement Confirmation: ETT inserted through vocal cords under direct vision, positive ETCO2 and breath sounds checked- equal and bilateral Secured at: 24 cm Tube secured with: Tape Dental Injury: Teeth and Oropharynx as per pre-operative assessment

## 2023-02-26 NOTE — Discharge Instructions (Signed)
[facility]  Please arrive at least 30 min before your appointment to complete your check in paperwork.  If you are unable to arrive 30 min prior to your appointment time we may have to cancel or reschedule you. LAPAROSCOPIC SURGERY: POST OP INSTRUCTIONS Always review your discharge instruction sheet given to you by the facility where your surgery was performed. IF YOU HAVE DISABILITY OR FAMILY LEAVE FORMS, YOU MUST BRING THEM TO THE OFFICE FOR PROCESSING.   DO NOT GIVE THEM TO YOUR DOCTOR.  PAIN CONTROL  First take acetaminophen (Tylenol) AND/or ibuprofen (Advil) to control your pain after surgery.  Follow directions on package.  Taking acetaminophen (Tylenol) and/or ibuprofen (Advil) regularly after surgery will help to control your pain and lower the amount of prescription pain medication you may need.  You should not take more than 4,000 mg (4 grams) of acetaminophen (Tylenol) in 24 hours.  You should not take ibuprofen (Advil), aleve, motrin, naprosyn or other NSAIDS if you have a history of stomach ulcers or chronic kidney disease.  A prescription for pain medication may be given to you upon discharge.  Take your pain medication as prescribed, if you still have uncontrolled pain after taking acetaminophen (Tylenol) or ibuprofen (Advil). Use ice packs to help control pain. If you need a refill on your pain medication, please contact your pharmacy.  They will contact our office to request authorization. Prescriptions will not be filled after 5pm or on week-ends.  HOME MEDICATIONS Take your usually prescribed medications unless otherwise directed.  DIET You should follow a light diet the first few days after arrival home.  Be sure to include lots of fluids daily. Avoid fatty, fried foods.   CONSTIPATION It is common to experience some constipation after surgery and if you are taking pain medication.  Increasing fluid intake and taking a stool softener (such as Colace)  will usually help or prevent this problem from occurring.  A mild laxative (Milk of Magnesia or Miralax) should be taken according to package instructions if there are no bowel movements after 48 hours.  WOUND/INCISION CARE Most patients will experience some swelling and bruising in the area of the incisions.  Ice packs will help.  Swelling and bruising can take several days to resolve.  Unless discharge instructions indicate otherwise, follow guidelines below  STERI-STRIPS - you may remove your outer bandages 48 hours after surgery, and you may shower at that time.  You have steri-strips (small skin tapes) in place directly over the incision.  These strips should be left on the skin for 7-10 days.   DERMABOND/SKIN GLUE - you may shower in 24 hours.  The glue will flake off over the next 2-3 weeks. Any sutures or staples will be removed at the office during your follow-up visit.  ACTIVITIES You may resume regular (light) daily activities beginning the next day--such as daily self-care, walking, climbing stairs--gradually increasing activities as tolerated.  You may have sexual intercourse when it is comfortable.  Refrain from any heavy lifting or straining until approved by your doctor. You may drive when you are no longer taking prescription pain medication, you can comfortably wear a seatbelt, and you can safely maneuver your car and apply brakes.  FOLLOW-UP You should see your doctor in the office for a follow-up appointment approximately 2-3 weeks after your surgery.  You should have been given your post-op/follow-up appointment when your surgery was scheduled.  If you did not receive a post-op/follow-up appointment, make sure   that you call for this appointment within a day or two after you arrive home to insure a convenient appointment time.   WHEN TO CALL YOUR DOCTOR: Fever over 101.0 Inability to urinate Continued bleeding from incision. Increased pain, redness, or drainage from the  incision. Increasing abdominal pain  The clinic staff is available to answer your questions during regular business hours.  Please don't hesitate to call and ask to speak to one of the nurses for clinical concerns.  If you have a medical emergency, go to the nearest emergency room or call 911.  A surgeon from [facility] is always on call at the hospital. [address], [address], , [place]  [address] ? [address] [address], , [place]   [address] [phone] ? [phone] ? FAX [phone]     Managing Your Pain After Surgery Without Opioids    Thank you for participating in our program to help patients manage their pain after surgery without opioids. This is part of our effort to provide you with the best care possible, without exposing you or your family to the risk that opioids pose.  What pain can I expect after surgery? You can expect to have some pain after surgery. This is normal. The pain is typically worse the day after surgery, and quickly begins to get better. Many studies have found that many patients are able to manage their pain after surgery with Over-the-Counter (OTC) medications such as Tylenol and Motrin. If you have a condition that does not allow you to take Tylenol or Motrin, notify your surgical team.  How will I manage my pain? The best strategy for controlling your pain after surgery is around the clock pain control with Tylenol (acetaminophen) and Motrin (ibuprofen or Advil). Alternating these medications with each other allows you to maximize your pain control. In addition to Tylenol and Motrin, you can use heating pads or ice packs on your incisions to help reduce your pain.  How will I alternate your regular strength over-the-counter pain medication? You will take a dose of pain medication every three hours. Start by taking 650 mg of Tylenol (2 pills of 325 mg) 3 hours later take 600 mg of Motrin (3 pills of 200 mg) 3 hours after  taking the Motrin take 650 mg of Tylenol 3 hours after that take 600 mg of Motrin.   - 1 -  See example - if your first dose of Tylenol is at 12:00 PM   12:00 PM Tylenol 650 mg (2 pills of 325 mg)  3:00 PM Motrin 600 mg (3 pills of 200 mg)  6:00 PM Tylenol 650 mg (2 pills of 325 mg)  9:00 PM Motrin 600 mg (3 pills of 200 mg)  Continue alternating every 3 hours   We recommend that you follow this schedule around-the-clock for at least 3 days after surgery, or until you feel that it is no longer needed. Use the table on the last page of this handout to keep track of the medications you are taking. Important: Do not take more than 3000mg of Tylenol or 3200mg of Motrin in a 24-hour period. Do not take ibuprofen/Motrin if you have a history of bleeding stomach ulcers, severe kidney disease, &/or actively taking a blood thinner  What if I still have pain? If you have pain that is not controlled with the over-the-counter pain medications (Tylenol and Motrin or Advil) you might have what we call "breakthrough" pain. You will receive a prescription   for a small amount of an opioid pain medication such as Oxycodone, Tramadol, or Tylenol with Codeine. Use these opioid pills in the first 24 hours after surgery if you have breakthrough pain. Do not take more than 1 pill every 4-6 hours.  If you still have uncontrolled pain after using all opioid pills, don't hesitate to call our staff using the number provided. We will help make sure you are managing your pain in the best way possible, and if necessary, we can provide a prescription for additional pain medication.   Day 1    Time  Name of Medication Number of pills taken  Amount of Acetaminophen  Pain Level   Comments  AM PM       AM PM       AM PM       AM PM       AM PM       AM PM       AM PM       AM PM       Total Daily amount of Acetaminophen Do not take more than  3,000 mg per day      Day 2    Time  Name of Medication  Number of pills taken  Amount of Acetaminophen  Pain Level   Comments  AM PM       AM PM       AM PM       AM PM       AM PM       AM PM       AM PM       AM PM       Total Daily amount of Acetaminophen Do not take more than  3,000 mg per day      Day 3    Time  Name of Medication Number of pills taken  Amount of Acetaminophen  Pain Level   Comments  AM PM       AM PM       AM PM       AM PM         AM PM       AM PM       AM PM       AM PM       Total Daily amount of Acetaminophen Do not take more than  3,000 mg per day      Day 4    Time  Name of Medication Number of pills taken  Amount of Acetaminophen  Pain Level   Comments  AM PM       AM PM       AM PM       AM PM       AM PM       AM PM       AM PM       AM PM       Total Daily amount of Acetaminophen Do not take more than  3,000 mg per day      Day 5    Time  Name of Medication Number of pills taken  Amount of Acetaminophen  Pain Level   Comments  AM PM       AM PM       AM PM       AM PM       AM PM       AM PM         AM PM       AM PM       Total Daily amount of Acetaminophen Do not take more than  3,000 mg per day      Day 6    Time  Name of Medication Number of pills taken  Amount of Acetaminophen  Pain Level  Comments  AM PM       AM PM       AM PM       AM PM       AM PM       AM PM       AM PM       AM PM       Total Daily amount of Acetaminophen Do not take more than  3,000 mg per day      Day 7    Time  Name of Medication Number of pills taken  Amount of Acetaminophen  Pain Level   Comments  AM PM       AM PM       AM PM       AM PM       AM PM       AM PM       AM PM       AM PM       Total Daily amount of Acetaminophen Do not take more than  3,000 mg per day        For additional information about how and where to safely dispose of unused opioid medications - https://www.morepowerfulnc.org  Disclaimer: This document contains  information and/or instructional materials adapted from [facility] for the typical patient with your condition. It does not replace medical advice from your health care provider because your experience may differ from that of the typical patient. Talk to your health care provider if you have any questions about this document, your condition or your treatment plan. Adapted from [facility]  

## 2023-02-26 NOTE — Transfer of Care (Signed)
Immediate Anesthesia Transfer of Care Note  Patient: Caleb Rivers  Procedure(s) Performed: LAPAROSCOPY DIAGNOSTIC  Patient Location: PACU  Anesthesia Type:General  Level of Consciousness: drowsy and patient cooperative  Airway & Oxygen Therapy: Patient Spontanous Breathing  Post-op Assessment: Report given to RN  Post vital signs: Reviewed and stable  Last Vitals:  Vitals Value Taken Time  BP 145/86 02/26/23 1041  Temp    Pulse 92 02/26/23 1043  Resp 20 02/26/23 1043  SpO2 96 % 02/26/23 1043  Vitals shown include unvalidated device data.  Last Pain:  Vitals:   02/26/23 0807  TempSrc: Oral  PainSc: 0-No pain         Complications: No notable events documented.

## 2023-02-27 ENCOUNTER — Other Ambulatory Visit (HOSPITAL_COMMUNITY): Payer: Self-pay

## 2023-02-27 ENCOUNTER — Encounter (HOSPITAL_COMMUNITY): Payer: Self-pay | Admitting: Surgery

## 2023-02-27 MED ORDER — OXYCODONE HCL 5 MG PO TABS
5.0000 mg | ORAL_TABLET | Freq: Four times a day (QID) | ORAL | 0 refills | Status: DC | PRN
  Filled 2023-02-27: qty 15, 4d supply, fill #0

## 2023-02-27 MED ORDER — ACETAMINOPHEN 500 MG PO TABS: 1000.0000 mg | ORAL_TABLET | Freq: Three times a day (TID) | ORAL | 0 refills | Status: DC | PRN

## 2023-02-27 MED ORDER — METHOCARBAMOL 500 MG PO TABS: 500.0000 mg | ORAL_TABLET | Freq: Four times a day (QID) | ORAL | Status: DC | PRN

## 2023-02-27 MED ORDER — OXYCODONE HCL 5 MG PO TABS: 5.0000 mg | ORAL_TABLET | ORAL | Status: DC | PRN

## 2023-02-27 NOTE — Progress Notes (Signed)
1 Day Post-Op  Subjective: CC: Tolerating liquids without abdominal pain, n/v. Passing flatus. No bm. Voiding. Has not mobilized since surgery.   Objective: Vital signs in last 24 hours: Temp:  [98.1 F (36.7 C)-98.4 F (36.9 C)] 98.4 F (36.9 C) (03/01 0751) Pulse Rate:  [74-82] 79 (03/01 0751) Resp:  [16-18] 18 (03/01 0312) BP: (137-153)/(76-81) 153/80 (03/01 0751) SpO2:  [95 %-100 %] 95 % (03/01 0751) Last BM Date : 03/26/23  Intake/Output from previous day: 02/29 0701 - 03/01 0700 In: 2653.9 [P.O.:720; I.V.:1833.9; IV Piggyback:100] Out: 10 [Blood:10] Intake/Output this shift: No intake/output data recorded.  PE: Gen:  Alert, NAD, pleasant Card:  Reg Pulm:  CTAB, no W/R/R, effort normal Abd: Soft, ND, NT, +BS, incisions with glue intact appears well and are without drainage, bleeding, or signs of infection Psych: A&Ox3   Lab Results:  Recent Labs    02/24/23 1451 02/26/23 0541  WBC 7.3 7.4  HGB 16.4 15.7  HCT 49.4 47.4  PLT 225 217   BMET Recent Labs    02/25/23 0046 02/26/23 0541  NA 135 138  K 3.9 3.9  CL 106 104  CO2 22 22  GLUCOSE 105* 84  BUN 13 13  CREATININE 0.97 1.17  CALCIUM 8.8* 9.0   PT/INR No results for input(s): "LABPROT", "INR" in the last 72 hours. CMP     Component Value Date/Time   NA 138 02/26/2023 0541   K 3.9 02/26/2023 0541   CL 104 02/26/2023 0541   CO2 22 02/26/2023 0541   GLUCOSE 84 02/26/2023 0541   BUN 13 02/26/2023 0541   CREATININE 1.17 02/26/2023 0541   CALCIUM 9.0 02/26/2023 0541   PROT 7.3 02/26/2023 0541   ALBUMIN 3.4 (L) 02/26/2023 0541   AST 30 02/26/2023 0541   ALT 30 02/26/2023 0541   ALKPHOS 64 02/26/2023 0541   BILITOT 1.1 02/26/2023 0541   GFRNONAA >60 02/26/2023 0541   GFRAA >60 09/19/2020 0804   Lipase     Component Value Date/Time   LIPASE 30 02/24/2023 1451    Studies/Results: No results found.  Anti-infectives: Anti-infectives (From admission, onward)    Start     Dose/Rate  Route Frequency Ordered Stop   02/26/23 0945  cefoTEtan (CEFOTAN) 2 g in sodium chloride 0.9 % 100 mL IVPB        2 g 200 mL/hr over 30 Minutes Intravenous To Surgery 02/26/23 0943 02/26/23 1023   02/25/23 1100  ceFAZolin (ANCEF) IVPB 2g/100 mL premix  Status:  Discontinued        2 g 200 mL/hr over 30 Minutes Intravenous On call to O.R. 02/25/23 1008 02/25/23 1035        Assessment/Plan POD 1 s/p dx laparoscopy by Dr. Dema Severin on 02/26/23 - Admission CT w/ sbo with transition point in the anterior mid abdomen. There was a focal mural nodular enhancement in the small bowel just proximal to the transition point concerning for underlying neoplasm.  - Intra-op findings with Resolved small bowel obstruction, no evident neoplasm  - Recommend f/u with GI as outpatient for capsule endoscopy - Adv diet.  - Mobilize - Pulm toilet  If patient mobilizes well and tolerates diet advancement, plan for discharge this afternoon. Will ask RN to mobilize patient. He works at [employer]. Will give work note. I discussed discharge instructions, restrictions and return/call back precautions. At the end of the conversation he did ask I come back with interpreter to repeat this in [place]. Will  come back prior to discharge and go over above again.   FEN - Adv to regular diet. SLIV VTE - SCDs, Lovenox ID - Peri-op abx. None currently. Afebrile.  Foley - None, voiding.  Plan - Adv diet. Possible pm d/c.    LOS: 3 days    Jillyn Ledger , [facility] 02/27/2023, 11:38 AM Please see Amion for pager number during day hours 7:00am-4:30pm

## 2023-02-27 NOTE — Progress Notes (Signed)
Patient ambulated down hallway and back to room with steady gait. Tolerated well.

## 2023-02-27 NOTE — Anesthesia Postprocedure Evaluation (Signed)
Anesthesia Post Note  Patient: Caleb Rivers  Procedure(s) Performed: LAPAROSCOPY DIAGNOSTIC     Patient location during evaluation: PACU Anesthesia Type: General Level of consciousness: awake and alert Pain management: pain level controlled Vital Signs Assessment: post-procedure vital signs reviewed and stable Respiratory status: spontaneous breathing, nonlabored ventilation and respiratory function stable Cardiovascular status: blood pressure returned to baseline and stable Postop Assessment: no apparent nausea or vomiting Anesthetic complications: no   No notable events documented.  Last Vitals:  Vitals:   02/27/23 0312 02/27/23 0751  BP: 137/76 (!) 153/80  Pulse: 82 79  Resp: 18   Temp: 36.8 C 36.9 C  SpO2: 100% 95%    Last Pain:  Vitals:   02/27/23 0751  TempSrc: Oral  PainSc:                  Lynda Rainwater

## 2023-02-27 NOTE — Discharge Summary (Signed)
Patient ID: Adams Menaker [id number] 1956-03-20 67 y.o.  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 Dema Severin on 02/26/23 with findings of resolved small bowel obstruction, no evident neoplasm    Consultants TRH  HPI Patient is a 67 year old male, [name] comes in secondary to abdominal pain, nausea vomiting.   Patient states that he had pain starting today.  He states he did have 1 bout of emesis.  Patient states he has no pain currently.  Patient had NG tube placed.   Upon evaluation the ER underwent CT scan.  This was significant for high-grade bowel obstruction with transition point in the anterior mid abdomen.  There is some small bowel nodular enhancement concerning for underlying neoplasm.   I reviewed the CT scan laboratory studies personally.  Procedures Dr. Dema Severin - Diagnostic Laparoscopy - 02/26/2023  Hospital Course:  Patient presented as above. CT w/ high-grade small-bowel obstruction with transition point in the anterior mid abdomen. There was focal mural nodular enhancement in the small bowel just proximal to the transition point, concerning for underlying neoplasm. NGT was placed. Admitted to [facility]. We were consulted. Patient recommended for surgery, initially declining this but ultimately agreeable after discussing with family. He was taken to the OR by Dr. Dema Severin on 2/29 for Diagnostic laparoscopy. Intra-op findings with resolved small bowel obstruction, no evident neoplasm. Patient tolerated procedure well. Diet was advanced and tolerated. On POD 1, the patient was voiding well, tolerating diet, pain controlled with po medications, vital signs stable, incisions c/d/i and felt stable for discharge home. He mobilized with RN prior to discharge. Using a [place] interpreter we discussed discharge instructions, restrictions and return/call back  precautions. We discussed importance of f/u with GI for further workup/capsule endoscopy. I have had our office make this referral. I also discussed this with his brother over the phone per the patient's request. Follow up with our office as noted below.    Allergies as of 02/27/2023       Reactions   Asa [aspirin] Other (See Comments)   Current and Hx of GI ulcer - per Pt and friend/translator   Nsaids Other (See Comments)   Current and Hx of GI ulcer per pt and friend/translator   Peanut-containing Drug Products    Pain.          Medication List     TAKE these medications    acetaminophen 500 MG tablet Commonly known as: TYLENOL Take 2 tablets (1,000 mg total) by mouth every 8 (eight) hours as needed.   omeprazole 20 MG capsule Commonly known as: PRILOSEC Take 1 capsule (20 mg total) by mouth daily.   oxyCODONE 5 MG immediate release tablet Commonly known as: Oxy IR/ROXICODONE Take 1 tablet (5 mg total) by mouth every 6 (six) hours as needed for breakthrough pain.   polyethylene glycol powder 17 GM/SCOOP powder Commonly known as: GLYCOLAX/MIRALAX Take 17 g by mouth 2 (two) times daily. Until daily soft stools OTC          Follow-up Information     Faiz Weber, Carlena Hurl, PA-C Follow up on 03/19/2023.   Specialty: General Surgery Why: 1:45 pm, Arrive 30 minutes prior to your appointment time, Please bring your insurance card and photo ID Contact information: [place] [address] [phone]         Gastroenterology, [facility] Follow up.   Why:  Please follow up with gastroenterology for a capsule endoscopy. Our office has made this referral. Contact information: [place] [address] [phone]                 Signed: Alferd Apa, [facility] 02/27/2023, 2:34 PM Please see Amion for pager number during day hours 7:00am-4:30pm

## 2023-03-02 ENCOUNTER — Telehealth: Payer: Self-pay

## 2023-03-02 NOTE — Transitions of Care (Post Inpatient/ED Visit) (Signed)
   03/02/2023  Name: Caleb Rivers MRN: [id number] DOB: 05-Jun-1956  Today's TOC FU Call Status: Today's TOC FU Call Status:: Unsuccessul Call (1st Attempt) Unsuccessful Call (1st Attempt) Date: 03/02/23  Attempted to reach the patient regarding the most recent Inpatient/ED visit.  Call placed with assistance of [place] interpreter [id number]/[employer]. Message left on [phone], call back requested.   Follow Up Plan: Additional outreach attempts will be made to reach the patient to complete the Transitions of Care (Post Inpatient/ED visit) call.   Signature Eden Lathe, RN

## 2023-03-03 ENCOUNTER — Telehealth: Payer: Self-pay

## 2023-03-03 NOTE — Transitions of Care (Post Inpatient/ED Visit) (Signed)
   03/03/2023  Name: Caleb Rivers MRN: [id number] DOB: 1956-08-21  Today's TOC FU Call Status: Today's TOC FU Call Status:: Unsuccessful Call (2nd Attempt) Unsuccessful Call (1st Attempt) Date: 03/02/23 Unsuccessful Call (2nd Attempt) Date: 03/03/23  Attempted to reach the patient regarding the most recent Inpatient/ED visit.  Call placed with assistance of [place] interpreter  [id number]/[employer]. Message left on [phone], call back requested.   Follow Up Plan: Additional outreach attempts will be made to reach the patient to complete the Transitions of Care (Post Inpatient/ED visit) call.   Signature Eden Lathe, RN

## 2023-03-04 ENCOUNTER — Telehealth: Payer: Self-pay

## 2023-03-04 NOTE — Transitions of Care (Post Inpatient/ED Visit) (Signed)
   03/04/2023  Name: Caleb Rivers MRN: [id number] DOB: Jan 15, 1956  Today's TOC FU Call Status: Today's TOC FU Call Status:: Successful TOC FU Call Competed (call initially placed with assistance of [place] interpreter [id number]/[employer]. patient answered and then requested I speak with his niece, Caleb Rivers, who speaks [place].  The call was then completed wiht Caleb Rivers) Unsuccessful Call (1st Attempt) Date: 03/02/23 Unsuccessful Call (2nd Attempt) Date: 03/03/23 [facility] FU Call Complete Date: 03/04/23  Transition Care Management Follow-up Telephone Call Date of Discharge: 02/27/23 Discharge Facility: [facility]) Type of Discharge: Inpatient Admission Primary Inpatient Discharge Diagnosis:: SBO How have you been since you were released from the hospital?: Better Any questions or concerns?: Yes Patient Questions/Concerns:: He is uninsured and his niece is concerned about referrals to specialists. He does not qualify for Medicaid.  I explained that he can applyfor [facility] Financial Assistance/[employer] and he would be able to been seen at a [facility]. they should not ask for payment upfront for services.   She also said that today he has been having abdominal pain in the same place it was bothering him when he went to the hospital.  pain is intermittent and 5/10. he has finished teh oxycodone and has not taken tylenol yet.  He has not eating today.  I explained that he should contact the surgeon should the pain persist. We also discussed when to return to ED. Caleb Rivers said she understood. Patient Questions/Concerns Addressed: Notified Provider of Patient Questions/Concerns  Items Reviewed: Did you receive and understand the discharge instructions provided?: Yes Medications obtained and verified?: Yes (Medications Reviewed) Any new allergies since your discharge?: No Dietary orders reviewed?: No Do you have support at home?: Yes People in Home: alone Name of Support/Comfort Primary Source:  but his niece lives 5 minutes away and checks on him regularly  [place] and Equipment/Supplies: [place] Ordered?: No Any new equipment or medical supplies ordered?: No  Functional Questionnaire: Do you need assistance with bathing/showering or dressing?: No Do you need assistance with meal preparation?: No Do you need assistance with eating?: No Do you have difficulty maintaining continence: No Do you need assistance with getting out of bed/getting out of a chair/moving?: No Do you have difficulty managing or taking your medications?: No  Folllow up appointments reviewed: PCP Follow-up appointment confirmed?: Yes Date of PCP follow-up appointment?: 03/06/23 Follow-up Provider: Lorenda Rivers,  will need to re-establish care Specialist Hospital Follow-up appointment confirmed?: Yes Date of Specialist follow-up appointment?: 03/19/23 Follow-Up Specialty Provider:: general surgery.   needs an appointment with GI. His niece has contacted [facility] and the patient has a $32 balance that they want paid before they will see him.  She is requesting a referral to [facility]. She has tried to [employer] but has not been able to get through Do you need transportation to your follow-up appointment?: No Do you understand care options if your condition(s) worsen?: Yes-patient verbalized understanding    SIGNATURE Caleb Lathe, RN

## 2023-03-04 NOTE — Telephone Encounter (Addendum)
From the discharge call:  (call initially placed with assistance of [place] interpreter [id number]/[employer]. patient answered and then requested I speak with his niece, Peri Maris, who speaks [place].  The call was then completed wiht [name])    He is uninsured and his niece is concerned about referrals to specialists. He does not qualify for Medicaid.  I explained that he can apply for [facility] Financial Assistance/[place] [facility] and he would be able to been seen at a [facility]. they should not ask for payment upfront for services.   She also said that today he has been having abdominal pain in the same place it was bothering him when he went to the hospital.  pain is intermittent and 5/10. he has finished the oxycodone and has not taken tylenol yet.  He has not eating today.  I explained that he should contact the surgeon should the pain persist. We also discussed when to return to ED. [name] said s  Date of PCP follow-up appointment?: 03/06/23 Follow-up Provider: Genice Rouge,  will need to re-establish care  Specialist Hospital Follow-up appointment confirmed?: Yes Date of Specialist follow-up appointment?: 03/19/23 Follow-Up Specialty Provider:: general surgery.   needs an appointment with GI. His niece has contacted [facility] and the patient has a $32 balance that they want paid before they will see him.  She is requesting a referral to  GI. She has tried to Proofreader but has not been able to get through

## 2023-03-06 ENCOUNTER — Other Ambulatory Visit (INDEPENDENT_AMBULATORY_CARE_PROVIDER_SITE_OTHER): Payer: Self-pay

## 2023-03-06 ENCOUNTER — Encounter (INDEPENDENT_AMBULATORY_CARE_PROVIDER_SITE_OTHER): Payer: Self-pay | Admitting: Primary Care

## 2023-03-06 ENCOUNTER — Ambulatory Visit (INDEPENDENT_AMBULATORY_CARE_PROVIDER_SITE_OTHER): Payer: Self-pay | Admitting: Primary Care

## 2023-03-06 ENCOUNTER — Other Ambulatory Visit: Payer: Self-pay

## 2023-03-06 VITALS — BP 150/90 | HR 86 | Resp 16 | Ht 77.0 in | Wt 226.8 lb

## 2023-03-06 DIAGNOSIS — Z1211 Encounter for screening for malignant neoplasm of colon: Secondary | ICD-10-CM

## 2023-03-06 DIAGNOSIS — I10 Essential (primary) hypertension: Secondary | ICD-10-CM

## 2023-03-06 DIAGNOSIS — Z7689 Persons encountering health services in other specified circumstances: Secondary | ICD-10-CM

## 2023-03-06 DIAGNOSIS — K56609 Unspecified intestinal obstruction, unspecified as to partial versus complete obstruction: Secondary | ICD-10-CM

## 2023-03-06 DIAGNOSIS — Z1159 Encounter for screening for other viral diseases: Secondary | ICD-10-CM

## 2023-03-06 MED ORDER — HYDROCHLOROTHIAZIDE 12.5 MG PO TABS
25.0000 mg | ORAL_TABLET | Freq: Every day | ORAL | 1 refills | Status: DC
  Filled 2023-03-06 (×3): qty 60, 30d supply, fill #0

## 2023-03-06 MED ORDER — HYDROCHLOROTHIAZIDE 12.5 MG PO TABS
25.0000 mg | ORAL_TABLET | Freq: Every day | ORAL | 1 refills | Status: DC
  Filled 2023-03-06: qty 90, 45d supply, fill #0

## 2023-03-06 NOTE — Progress Notes (Signed)
[facility]   Subjective:   [name] is a 67 y.o. male ( niece Peri Maris is present and given permission to be at his appt and interpret when needed. He presents for hospital follow up and establish care. (Last visit 2 years ago) Patient presented to the ED for  passing a small hard stools and started to have cramping-like abdominal pain around umbilical area, constant, associated with nauseous vomiting of stomach content greenish, nonbloody. Admit date to the hospital was 02/24/23, patient was discharged from the hospital on 02/27/23, patient was admitted for: small bowel obstruction. Today he present no complaints. Patient has No headache, No chest pain, No abdominal pain - No Nausea, No new weakness tingling or numbness, No Cough - shortness of breath. Requesting a referral to [facility] -unable to return due to debt.    Past Medical History:  Diagnosis Date   Constipation    Gastric ulcer    Ulcer      Allergies  Allergen Reactions   Asa [Aspirin] Other (See Comments)    Current and Hx of GI ulcer - per Pt and friend/translator   Nsaids Other (See Comments)    Current and Hx of GI ulcer per pt and friend/translator   Peanut-Containing Drug Products     Pain.        Current Outpatient Medications on File Prior to Visit  Medication Sig Dispense Refill   acetaminophen (TYLENOL) 500 MG tablet Take 2 tablets (1,000 mg total) by mouth every 8 (eight) hours as needed. 30 tablet 0   omeprazole (PRILOSEC) 20 MG capsule Take 1 capsule (20 mg total) by mouth daily. (Patient not taking: Reported on 02/26/2023) 30 capsule 0   oxyCODONE (OXY IR/ROXICODONE) 5 MG immediate release tablet Take 1 tablet (5 mg total) by mouth every 6 (six) hours as needed for breakthrough pain. 15 tablet 0   polyethylene glycol powder (GLYCOLAX/MIRALAX) 17 GM/SCOOP powder Take 17 g by mouth 2 (two) times daily. Until daily soft stools OTC (Patient not taking: Reported on 02/26/2023) 225 g 0   No current  facility-administered medications on file prior to visit.     Review of System: Comprehensive ROS Pertinent positive and negative noted in HPI    Objective:  Blood Pressure (Abnormal) 163/88 (BP Location: Left Arm, Patient Position: Sitting, Cuff Size: Large)   Pulse 86   Respiration 16   Height '6\' 5"'$  (1.956 m)   Weight 226 lb 12.8 oz (102.9 kg)   Oxygen Saturation 97%   Body Mass Index 26.89 kg/m     Physical Exam: General Appearance: Well nourished, in no apparent distress. Eyes: PERRLA, EOMs, conjunctiva no swelling or erythema Sinuses: No Frontal/maxillary tenderness ENT/Mouth: Ext aud canals clear, TMs without erythema, bulging. No erythema, swelling, or exudate on post pharynx.  Tonsils not swollen or erythematous. Hearing normal.  Neck: Supple, thyroid normal.  Respiratory: Respiratory effort normal, BS equal bilaterally without rales, rhonchi, wheezing or stridor.  Cardio: RRR with no MRGs. Brisk peripheral pulses without edema.  Abdomen: Soft, + BS.  Non tender, no guarding, rebound, hernias, masses. Lymphatics: Non tender without lymphadenopathy.  Musculoskeletal: Full ROM, 5/5 strength, normal gait.  Skin: Warm, dry without rashes, lesions, ecchymosis.  Neuro: Cranial nerves intact. Normal muscle tone, no cerebellar symptoms. Sensation intact.  Psych: Awake and oriented X 3, normal affect, Insight and Judgment appropriate.    Assessment:   No diagnosis found.  No orders of the defined types were placed in this encounter.  Follow up  with Maczis, Carlena Hurl, PA-C (General Surgery) on 03/19/2023; 1:45 pm, Arrive 30 minutes prior to your appointment time, Please bring your insurance card and photo ID Follow up with Gastroenterology, [facility]; Please follow up with gastroenterology for a capsule endoscopy. Our office has made this referral This note has been created with Surveyor, quantity. Any transcriptional errors are  unintentional.   Kerin Perna, NP 03/06/2023, 8:40 AM

## 2023-03-07 LAB — HCV AB W REFLEX TO QUANT PCR: HCV Ab: NONREACTIVE

## 2023-03-07 LAB — HCV INTERPRETATION

## 2023-03-07 NOTE — Progress Notes (Signed)
Your results done for screening hepatitis is negative

## 2023-03-10 ENCOUNTER — Telehealth (INDEPENDENT_AMBULATORY_CARE_PROVIDER_SITE_OTHER): Payer: Self-pay

## 2023-03-10 NOTE — Telephone Encounter (Signed)
[employer] deborah  Id# [id number]  contacted pt to go over lab results pt is aware and doesn't have any questions or concerns

## 2023-03-13 ENCOUNTER — Telehealth: Payer: Self-pay

## 2023-03-13 NOTE — Telephone Encounter (Signed)
Pt was called and vm was left, Information has been sent to nurse pool.   Interpreter id [id number]

## 2023-03-13 NOTE — Telephone Encounter (Signed)
-----   Message from Carilyn Goodpasture, RN sent at 03/13/2023 11:19 AM EDT -----  ----- Message ----- From: Kerin Perna, NP Sent: 03/07/2023   7:59 PM EDT To: Verdell Carmine, RMA; Carilyn Goodpasture, RN  Your results done for screening hepatitis is negative

## 2023-03-27 ENCOUNTER — Ambulatory Visit (INDEPENDENT_AMBULATORY_CARE_PROVIDER_SITE_OTHER): Payer: Self-pay

## 2023-03-27 VITALS — BP 152/81

## 2023-03-27 DIAGNOSIS — Z0130 Encounter for examination of blood pressure without abnormal findings: Secondary | ICD-10-CM

## 2023-04-10 ENCOUNTER — Ambulatory Visit (INDEPENDENT_AMBULATORY_CARE_PROVIDER_SITE_OTHER): Payer: Self-pay

## 2023-04-24 ENCOUNTER — Ambulatory Visit (INDEPENDENT_AMBULATORY_CARE_PROVIDER_SITE_OTHER): Payer: Self-pay

## 2023-05-29 ENCOUNTER — Ambulatory Visit (INDEPENDENT_AMBULATORY_CARE_PROVIDER_SITE_OTHER): Payer: Self-pay | Admitting: Primary Care

## 2023-06-18 ENCOUNTER — Emergency Department (HOSPITAL_COMMUNITY)
Admission: EM | Admit: 2023-06-18 | Discharge: 2023-06-19 | Disposition: A | Discharge status: Home / Self Care | Payer: Self-pay | Attending: Emergency Medicine | Admitting: Emergency Medicine

## 2023-06-18 ENCOUNTER — Other Ambulatory Visit: Payer: Self-pay

## 2023-06-18 DIAGNOSIS — K1379 Other lesions of oral mucosa: Secondary | ICD-10-CM | POA: Insufficient documentation

## 2023-06-18 DIAGNOSIS — Z9861 Coronary angioplasty status: Secondary | ICD-10-CM | POA: Insufficient documentation

## 2023-06-18 NOTE — ED Triage Notes (Signed)
Patient reports pain inside his mouth " burning" for 2 weeks , denies injury , no swelling , respirations unlabored .

## 2023-06-19 ENCOUNTER — Other Ambulatory Visit (HOSPITAL_COMMUNITY): Payer: Self-pay

## 2023-06-19 MED ORDER — ALUM & MAG HYDROXIDE-SIMETH 200-200-20 MG/5ML PO SUSP
30.0000 mL | Freq: Once | ORAL | Status: AC
  Administered 2023-06-19: 30 mL via ORAL
  Filled 2023-06-19: qty 30

## 2023-06-19 MED ORDER — ACETAMINOPHEN 500 MG PO TABS
500.0000 mg | ORAL_TABLET | Freq: Four times a day (QID) | ORAL | 0 refills | Status: AC | PRN
  Filled 2023-06-19: qty 100, 25d supply, fill #0

## 2023-06-19 MED ORDER — LIDOCAINE VISCOUS HCL 2 % MT SOLN
15.0000 mL | Freq: Once | OROMUCOSAL | Status: AC
  Administered 2023-06-19: 15 mL via OROMUCOSAL
  Filled 2023-06-19: qty 15

## 2023-06-19 NOTE — ED Provider Notes (Signed)
[facility]EM[facility] DEPT [facility] Emergency Department Provider Note MRN:  [id number]  Arrival date & time: 06/19/23     Chief Complaint   Mouth Pain    History of Present Illness   Caleb Rivers is a 67 y.o. year-old male with no pertinent past medical history presenting to the ED with chief complaint of oral pain.  Burning pain to the tongue, gums, entire mouth for the past 3 weeks.  Does not have a regular doctor.  Denies fever, no trauma.  No tooth pain.  Review of Systems  A thorough review of systems was obtained and all systems are negative except as noted in the HPI and PMH.   Patient's Health History    Past Medical History:  Diagnosis Date   Constipation    Gastric ulcer    Ulcer     Past Surgical History:  Procedure Laterality Date   LAPAROSCOPY N/A 02/26/2023   Procedure: LAPAROSCOPY DIAGNOSTIC;  Surgeon: Andria Meuse, MD;  Location: [facility] OR;  Service: General;  Laterality: N/A;   LEFT HEART CATHETERIZATION WITH CORONARY ANGIOGRAM N/A 09/25/2013   Procedure: LEFT HEART CATHETERIZATION WITH CORONARY ANGIOGRAM;  Surgeon: Lennette Bihari, MD;  Location: [facility] CATH LAB;  Service: Cardiovascular;  Laterality: N/A;    Family History  Problem Relation Age of Onset   Diabetes Mellitus II Neg Hx    Cancer Neg Hx     Social History   Socioeconomic History   Marital status: Single    Spouse name: Not on file   Number of children: Not on file   Years of education: Not on file   Highest education level: Not on file  Occupational History   Not on file  Tobacco Use   Smoking status: Never   Smokeless tobacco: Never  Substance and Sexual Activity   Alcohol use: No   Drug use: No   Sexual activity: Not Currently  Other Topics Concern   Not on file  Social History Narrative   Psychiatric nurse, not married.   Social Determinants of Health   Financial Resource Strain: Medium Risk (09/05/2019)   Overall Financial Resource Strain (CARDIA)    Difficulty of Paying  Living Expenses: Somewhat hard  Food Insecurity: No Food Insecurity (02/24/2023)   Hunger Vital Sign    Worried About Running Out of Food in the Last Year: Never true    Ran Out of Food in the Last Year: Never true  Transportation Needs: No Transportation Needs (02/24/2023)   PRAPARE - Administrator, Civil Service (Medical): No    Lack of Transportation (Non-Medical): No  Physical Activity: Inactive (09/05/2019)   Exercise Vital Sign    Days of Exercise per Week: 0 days    Minutes of Exercise per Session: 0 min  Stress: No Stress Concern Present (09/05/2019)   Harley[employer]Davidson of Occupational Health - Occupational Stress Questionnaire    Feeling of Stress : Not at all  Social Connections: Unknown (09/05/2019)   Social Connection and Isolation Panel [NHANES]    Frequency of Communication with Friends and Family: Patient declined    Frequency of Social Gatherings with Friends and Family: Patient declined    Attends Religious Services: Patient declined    Active Member of Clubs or Organizations: Patient declined    Attends Banker Meetings: Patient declined    Marital Status: Patient declined  Intimate Partner Violence: Not At Risk (02/24/2023)   Humiliation, Afraid, Rape, and Kick questionnaire    Fear of Current  or Ex-Partner: No    Emotionally Abused: No    Physically Abused: No    Sexually Abused: No     Physical Exam   Vitals:   06/19/23 0138 06/19/23 0440  BP: (!) 147/101 (!) 139/98  Pulse: 75 74  Resp: 18 18  Temp: 98.3 F (36.8 C) 98.3 F (36.8 C)  SpO2: 99% 98%    CONSTITUTIONAL: Well-appearing, NAD NEURO/PSYCH:  Alert and oriented x 3, no focal deficits EYES:  eyes equal and reactive ENT/NECK:  no LAD, no JVD CARDIO: Regular rate, well-perfused, normal S1 and S2 PULM:  CTAB no wheezing or rhonchi GI/GU:  non-distended, non-tender MSK/SPINE:  No gross deformities, no edema SKIN:  no rash, atraumatic   *Additional and/or pertinent findings  included in MDM below  Diagnostic and Interventional Summary    EKG Interpretation  Date/Time:    Ventricular Rate:    PR Interval:    QRS Duration:   QT Interval:    QTC Calculation:   R Axis:     Text Interpretation:         Labs Reviewed - No data to display  No orders to display    Medications  lidocaine (XYLOCAINE) 2 % viscous mouth solution 15 mL (has no administration in time range)  alum & mag hydroxide-simeth (MAALOX/MYLANTA) 200-200-20 MG/5ML suspension 30 mL (has no administration in time range)     Procedures  /  Critical Care Procedures  ED Course and Medical Decision Making  Initial Impression and Ddx Generally poor tooth health but no tooth pain.  I see no signs of abscess or infection.  I see no objective pathology to explain patient's burning oral pain.  Vital signs are normal, resting comfortably, wakes easily, no airway concerns, moving his mouth normally, talking normally.  Past medical/surgical history that increases complexity of ED encounter: None  Interpretation of Diagnostics Laboratory and/or imaging options to aid in the diagnosis/care of the patient were considered.  After careful history and physical examination, it was determined that there was no indication for diagnostics at this time.  Patient Reassessment and Ultimate Disposition/Management     No emergent process, appropriate for discharge.  Patient management required discussion with the following services or consulting groups:  None  Complexity of Problems Addressed Acute complicated illness or Injury  Additional Data Reviewed and Analyzed Further history obtained from: None  Additional Factors Impacting ED Encounter Risk Prescriptions  Elmer Sow. Pilar Plate, MD [facility] Emergency Medicine [facility] [email or web address]  Final Clinical Impressions(s) / ED Diagnoses     ICD-10-CM   1. Oral pain of unknown etiology  K13.79       ED Discharge Orders           Ordered    acetaminophen (TYLENOL) 500 MG tablet  Every 6 hours PRN        06/19/23 0514             Discharge Instructions Discussed with and Provided to Patient:    Discharge Instructions      You were evaluated in the Emergency Department and after careful evaluation, we did not find any emergent condition requiring admission or further testing in the hospital.  Your exam/testing today is overall reassuring.  Recommend follow-up with a primary care doctor or dentist or an ENT specialist to further discuss her symptoms.  Until then can use Tylenol anti-inflammatory provided for the pain.  Please return to the Emergency Department if you experience any worsening of your  condition.   Thank you for allowing [place] to be a part of your care.      Sabas Sous, MD 06/19/23 [phone]

## 2023-06-19 NOTE — Discharge Instructions (Addendum)
You were evaluated in the Emergency Department and after careful evaluation, we did not find any emergent condition requiring admission or further testing in the hospital.  Your exam/testing today is overall reassuring.  Recommend follow-up with a primary care doctor or dentist or an ENT specialist to further discuss her symptoms.  Until then can use Tylenol anti-inflammatory provided for the pain.  Please return to the Emergency Department if you experience any worsening of your condition.   Thank you for allowing [place] to be a part of your care.

## 2023-07-08 ENCOUNTER — Encounter (INDEPENDENT_AMBULATORY_CARE_PROVIDER_SITE_OTHER): Payer: Self-pay | Admitting: Primary Care

## 2023-07-08 ENCOUNTER — Ambulatory Visit (INDEPENDENT_AMBULATORY_CARE_PROVIDER_SITE_OTHER): Payer: Self-pay | Admitting: Primary Care

## 2023-07-08 ENCOUNTER — Other Ambulatory Visit: Payer: Self-pay

## 2023-07-08 VITALS — BP 157/90 | HR 78 | Resp 16 | Wt 216.8 lb

## 2023-07-08 DIAGNOSIS — I10 Essential (primary) hypertension: Secondary | ICD-10-CM

## 2023-07-08 MED ORDER — HYDROCHLOROTHIAZIDE 25 MG PO TABS
25.0000 mg | ORAL_TABLET | Freq: Every day | ORAL | 1 refills | Status: DC
  Filled 2023-07-08: qty 90, 90d supply, fill #0

## 2023-07-08 MED ORDER — OMEPRAZOLE 20 MG PO CPDR
20.0000 mg | DELAYED_RELEASE_CAPSULE | Freq: Every day | ORAL | 0 refills | Status: DC
  Filled 2023-07-08: qty 30, 30d supply, fill #0

## 2023-07-08 MED ORDER — AMLODIPINE BESYLATE 5 MG PO TABS
10.0000 mg | ORAL_TABLET | Freq: Every day | ORAL | 1 refills | Status: DC
  Filled 2023-07-08: qty 90, 45d supply, fill #0

## 2023-07-08 NOTE — Patient Instructions (Signed)
Grer votre hypertension Managing Your Hypertension L'hypertension, galement appele tension artrielle leve, se produit lorsque la pression du sang exerce contre les parois des artres est trop forte. Les artres [employer] vaisseaux sanguins qui Harley-Davidson sang du cour Warehouse manager. L'hypertension oblige le cour  battre davantage pour The Kroger sang et Estate manager/land agent rtrcissement ou la raideur des artres. Comprendre les mesures de la tension artrielle La mesure de la tension artrielle est donne par deux chiffres : un nombre suprieur sur un nombre infrieur. Le premier chiffre (le plus lev) correspond  la pression systolique. Il s'agit de la mesure de la pression dans les artres au moment o le cour se contracte. Le second chiffre (le plus bas) correspond  la pression diastolique. Il s'agit de la mesure de la pression dans les artres Lincoln National Corporation cour est au repos. Chez la plupart des [place], une tension artrielle normale se situe en dessous de 120/80. Votre tension artrielle cible peut varier en fonction de vos problmes de sant, de votre ge et World Fuel Services Corporation. La tension artrielle est classe en quatre stades. En fonction de la mesure de votre tension artrielle, IT trainer de soins de sant pourra utiliser les stades suivants pour Leggett & Platt type de traitement qui vous convient, si ncessaire. La pression systolique et la pression diastolique sont mesures au moyen d'une unit appele  millimtres de mercure  (mmHg). Normale Pression systolique : infrieure  120. Pression diastolique : infrieure  80. leve Pression systolique : 120  129. Pression diastolique : infrieure  80. Hypertension stade 1 Pression systolique : 130  139. Pression diastolique : 80  89. Hypertension stade 2 Pression systolique : 140 ou suprieure. Pression diastolique : 90 ou suprieure. Quels effets cette affection peut-elle avoir sur moi ? Il est trs important  de prendre en charge son hypertension. Au fil du Fiserv, l'hypertension peut endommager les artres et American Express flux sanguin arrivant aux parties [employer].R. [employer]nelley, Management consultant, le cour et EMCOR. Une hypertension non traite ou non contrle peut entraner : Une crise cardiaque. Un accident vasculaire crbral. Un affaiblissement des vaisseaux sanguins (anvrisme). Une insuffisance cardiaque. Des lsions rnales. Des lsions oculaires. Des troubles de la mmoire et de la concentration. La dmence vasculaire. Que puis-je faire pour grer cette affection ? L'hypertension peut se grer en apportant des changements  son mode de vie et, Physicist, medical, par PACCAR Inc. Votre prestataire de soins de sant vous aidera  tablir un plan visant  ramener votre tension artrielle dans une zone plus proche de la normale. Vous pourriez tre orient(e) vers un conseiller qui vous aidera  adopter une alimentation saine et tablir un programme d'activit physique. Nutrition  Adoptez une alimentation riche en fibres et en potassium, et pauvre en sel (sodium), en sucre ajout et en matires grasses. Vous pourriez, par Hormel Foods, adopter le plan d'alimentation DASH. DASH est l'abrviation de  Dietary Approaches to Stop Hypertension  (Approches dittiques pour arrter l'hypertension). Pour suivre ce rgime : Mangez beaucoup de fruits et de lgumes frais. Essayez de remplir la moiti de votre assiette de fruits et de lgumes  chaque repas. Consommez des crales compltes, par Jacobs Engineering ptes au bl complet, du riz brun ou du pain complet. Remplissez environ un quart de votre assiette de crales compltes. Consommez des Boston Scientific grasses. vitez les coupes de viande grasses, les viandes transformes ou saumures, et la EMCOR. Remplissez environ un quart de votre assiette de protines  maigres, WESCO International, du poulet sans la peau, des haricots secs, des  oufs ou du tofu. vitez les aliments prpars ou transforms. Ceux-ci ont tendance  tre trs riches en sodium, en sucre ajout et en matires grasses. Rduisez votre consommation journalire de sodium. En gnral, les personnes souffrant d'hypertension devraient consommer moins de 1 500 mg de sodium par jour. Mode de Tax inspector l'aide de votre prestataire de soins de sant pour BlueLinx un poids sant ou pour Union Pacific Corporation. Demandez-lui quel est votre poids optimal. Faites au moins 30 minutes d'exercice physique qui font battre votre cour rapidement (exercice arobique) chaque jour ou presque. Vous pourriez, par Hormel Foods, Therapist, music, nager ou faire du vlo. Incluez galement dans votre routine hebdomadaire, des exercices pour renforcer vos muscles (exercice de rsistance) en pratiquant, par exemple, l'haltrophilie. Tobi Bastos de faire ce type d'exercices pendant 30 minutes au moins 3 jours par semaine. N'utilisez pas de produits contenant de la nicotine ou du tabac. Il s'agit notamment des cigarettes, du tabac  mcher et des vapoteuses, comme les cigarettes lectroniques. Si vous avez besoin d'aide pour arrter de fumer, demandez conseil  votre prestataire de soins de sant. Contrlez toutes les affections  long terme (chroniques) dont vous souffrez, par exemple, un taux de cholestrol lev et un diabte. Identifiez les causes de votre stress et trouvez des Tesoro Corporation de Oceanographer. Vous pourriez, par exemple, pratiquer la mditation, la respiration profonde ou rserver du Berkshire Hathaway des YUM! Brands vous plaisent. Consommation d'alcool Ne buvez pas d'alcool si : Votre prestataire de soins de sant vous l'interdit. Vous tes enceinte, pourriez l'tre ou envisagez de dbuter Group 1 Automotive. Si vous consommez de l'alcool : Limitez votre consommation  : 1 verre par jour pour Becton, Dickinson and Company. 2 verres par Energy East Corporation. Sachez quelle quantit d'alcool est contenue dans votre verre. Aux .-U., un  verre correspond  une bouteille de bire (355 ml [12 onces]),  un verre de vin (148 ml [5 onces]) ou  un verre  liqueur d'alcool fort (44 ml [1,5 once]). Mdicaments Votre prestataire de soins de sant pourra vous prescrire des mdicaments si les modifications que vous aurez apportes  votre mode de vie n'ont pas suffi  contrler votre tension artrielle et si : Votre pression systolique est gale ou suprieure  130. Votre pression diastolique est gale ou suprieure  80. Prenez vos mdicaments en suivant scrupuleusement les instructions de votre prestataire de soins de sant. Suivez attentivement les instructions. Vous devez prendre vos mdicaments contre l'hypertension en suivant les recommandations de votre prestataire de soins de sant. Le mdicament ne sera pas aussi efficace si vous sautez des doses. Sauter des doses vous expose Owens-Illinois. Surveillance Avant de prendre votre tension artrielle : Ne fumez pas, ne buvez pas de boissons contenant de la cafine et ne faites pas d'exercice physique au cours des 30 minutes prcdant la Staley. Allez aux toilettes et videz votre vessie Fletcher Anon). Asseyez-vous tranquillement pendant au moins 5 minutes avant de prendre votre tension. Prenez votre tension artrielle Chesapeake Energy indiqu par votre prestataire de soins de sant. Pour ce faire : Ambulance person Constellation Energy droit et appuy. Posez vos pieds  plat sur le sol. Ne croisez pas vos jambes. Posez votre bras sur une surface plane, McDonald's Corporation table. Veillez  ce que votre bras se trouve au niveau de votre cour. Chaque fois que vous effectuez une [place], ralisez deux ou trois lectures  une minute d'intervalle et notez les rsultats. Vous  devrez peut-tre galement faire vrifier votre tension artrielle rgulirement par Nucor Corporation de soins de sant. Informations gnrales Printmaker de soins de sant de votre rgime alimentaire, de vos habitudes en  matire d'activit physique et d'autres facteurs du mode de vie pouvant contribuer  l'hypertension. Passez en revue tous les mdicaments que vous prenez avec votre prestataire de soins de sant, car ils pourraient avoir des effets indsirables ou des interactions. Rendez-vous  toutes les visites de suivi. Votre prestataire de soins de sant peut vous aider  laborer un plan personnalis pour grer votre hypertension artrielle. Pour plus d'informations National Heart, Lung, and Blood Institute ([employer], des poumons et du sang) : PopSteam.is American Heart Association (Association amricaine de cardiologie) : www.heart.org Prenez Sales promotion account executive de soins de sant si : Vous pensez avoir une raction aux mdicaments que vous avez pris. Vous souffrez de maux de tte rcurrents. Vous National Oilwell Varco. Vos chevilles gonflent. Vous avez des troubles de Paramedic. Demandez immdiatement de l'aide si : Vous souffrez de maux de tte intenses ou vous vous sentez dsorient(e). Vous ressentez une faiblesse inhabituelle, un engourdissement ou une sensation d'vanouissement. Vous ressentez de fortes douleurs au niveau de la poitrine ou de l'abdomen. Vous vomissez  plusieurs reprises. Vous avez des Secondary school teacher. Ces symptmes peuvent constituer une urgence mdicale. Demandez de l'aide immdiatement. Appelez le 911. N'attendez pas de voir si les symptmes disparaissent. Ne prenez Event organiser, faites-vous accompagner  l'hpital. Rsum L'hypertension se produit lorsque la force de Praxair sang dans vos artres est trop forte. Si cette affection n'est pas contrle, elle peut vous exposer  de graves complications. Votre tension artrielle cible peut varier en fonction de vos problmes de sant, de votre ge et World Fuel Services Corporation. Chez la plupart des [place], une tension artrielle normale est infrieure  120/80. L'hypertension se gre en apportant des  changements  son mode de vie ou par des ONEOK, ou bien par Countrywide Financial des deux. Les Soil scientist  son mode de vie pour tenter de grer l'hypertension consistent notamment  perdre du poids, adopter une alimentation saine et pauvre en sodium, faire davantage d'exercice physique et Interior and spatial designer. Ces conseils et renseignements ne sauraient se substituer  l'avis mdical de votre prestataire de soins de sant. Par consquent, il est primordial de parler de toutes vos proccupations avec votre prestataire de soins de sant. Document Revised: 09/25/2021 Document Reviewed: 09/25/2021 Elsevier Patient Education  2024 [employer]vin[employer].

## 2023-07-08 NOTE — Progress Notes (Signed)
[facility]  Russ Looper, is a 67 y.o. male  ONG:[id number]  GMW:[id number]  DOB - 07-Jun-1956  Chief Complaint  Patient presents with   Abdominal Pain   Back Pain       Subjective:   Mr.Cirilo Mcevers is a 67 y.o. male here today for a acute visit. He is complaining of abdominal pain and back pain. Patient has No headache, No chest pain, No abdominal pain - No Nausea, No new weakness tingling or numbness, No Cough - shortness of breath  No problems updated.  Allergies  Allergen Reactions   Asa [Aspirin] Other (See Comments)    Current and Hx of GI ulcer - per Pt and friend/translator   Nsaids Other (See Comments)    Current and Hx of GI ulcer per pt and friend/translator   Peanut-Containing Drug Products     Pain.      Past Medical History:  Diagnosis Date   Constipation    Gastric ulcer    Ulcer     Current Outpatient Medications on File Prior to Visit  Medication Sig Dispense Refill   acetaminophen (TYLENOL) 500 MG tablet Take 1 tablet (500 mg total) by mouth every 6 (six) hours as needed. 30 tablet 0   oxyCODONE (OXY IR/ROXICODONE) 5 MG immediate release tablet Take 1 tablet (5 mg total) by mouth every 6 (six) hours as needed for breakthrough pain. (Patient not taking: Reported on 07/08/2023) 15 tablet 0   polyethylene glycol powder (GLYCOLAX/MIRALAX) 17 GM/SCOOP powder Take 17 g by mouth 2 (two) times daily. Until daily soft stools OTC (Patient not taking: Reported on 02/26/2023) 225 g 0   No current facility-administered medications on file prior to visit.    Objective:   Vitals:   07/08/23 1101 07/08/23 1102  BP: (Abnormal) 157/89 (Abnormal) 157/90  Pulse: 78   Resp: 16   SpO2: 100%   Weight: 216 lb 12.8 oz (98.3 kg)     Comprehensive ROS Pertinent positive and negative noted in HPI   Exam General appearance : Awake, alert, not in any distress. Speech Clear. Not toxic looking HEENT: Atraumatic and Normocephalic, pupils equally  reactive to light and accomodation Neck: Supple, no JVD. No cervical lymphadenopathy.  Chest: Good air entry bilaterally, no added sounds  CVS: S1 S2 regular, no murmurs.  Abdomen: Bowel sounds present, Non tender and not distended with no gaurding, rigidity or rebound. Extremities: B/L Lower Ext shows no edema, both legs are warm to touch Neurology: Awake alert, and oriented X 3, CN II-XII intact, Non focal Skin: No Rash  Data Review Lab Results  Component Value Date   HGBA1C  04/29/2009    5.0 (NOTE) The ADA recommends the following therapeutic goal for glycemic control related to Hgb A1c measurement: Goal of therapy: <6.5 Hgb A1c  Reference: American Diabetes Association: Clinical Practice Recommendations 2010, Diabetes Care, 2010, 33: (Suppl  1).    Assessment & Plan  Jeromie was seen today for abdominal pain and back pain.  Diagnoses and all orders for this visit:  Essential hypertension BP goal - < 130/80 Explained that having normal blood pressure is the goal and medications are helping to get to goal and maintain normal blood pressure. DIET: Limit salt intake, read nutrition labels to check salt content, limit fried and high fatty foods  Avoid using multisymptom OTC cold preparations that generally contain sudafed which can rise BP. Consult with pharmacist on best cold relief products to use for persons with HTN EXERCISE Discussed incorporating  exercise such as walking - 30 minutes most days of the week and can do in 10 minute intervals    Other orders -     hydrochlorothiazide (HYDRODIURIL) 25 MG tablet; Take 1 tablet (25 mg total) by mouth daily. -     omeprazole (PRILOSEC) 20 MG capsule; Take 1 capsule (20 mg total) by mouth daily. -     amLODipine (NORVASC) 5 MG tablet; Take 2 tablets (10 mg total) by mouth daily.    Patient have been counseled extensively about nutrition and exercise. Other issues discussed during this visit include: low cholesterol diet, weight  control and daily exercise, foot care, annual eye examinations at Ophthalmology, importance of adherence with medications and regular follow-up. We also discussed long term complications of uncontrolled diabetes and hypertension.   Return in about 3 weeks (around 07/29/2023) for Bp ck only .  The patient was given clear instructions to go to ER or return to medical center if symptoms don't improve, worsen or new problems develop. The patient verbalized understanding. The patient was told to call to get lab results if they haven't heard anything in the next week.   This note has been created with Education officer, environmental. Any transcriptional errors are unintentional.   Grayce Sessions, NP 07/09/2023, 9:50 AM [id number] [address]uster

## 2023-07-14 ENCOUNTER — Other Ambulatory Visit: Payer: Self-pay

## 2023-07-14 ENCOUNTER — Encounter (HOSPITAL_COMMUNITY): Payer: Self-pay

## 2023-07-14 ENCOUNTER — Emergency Department (HOSPITAL_COMMUNITY): Payer: Self-pay

## 2023-07-14 ENCOUNTER — Emergency Department (HOSPITAL_COMMUNITY)
Admission: EM | Admit: 2023-07-14 | Discharge: 2023-07-14 | Disposition: A | Discharge status: Home / Self Care | Payer: Self-pay | Attending: Emergency Medicine | Admitting: Emergency Medicine

## 2023-07-14 DIAGNOSIS — R1110 Vomiting, unspecified: Secondary | ICD-10-CM | POA: Insufficient documentation

## 2023-07-14 DIAGNOSIS — R112 Nausea with vomiting, unspecified: Secondary | ICD-10-CM

## 2023-07-14 DIAGNOSIS — K146 Glossodynia: Secondary | ICD-10-CM | POA: Insufficient documentation

## 2023-07-14 LAB — COMPREHENSIVE METABOLIC PANEL
ALT: 41 U/L (ref 0–44)
AST: 43 U/L — ABNORMAL HIGH (ref 15–41)
Albumin: 4.2 g/dL (ref 3.5–5.0)
Alkaline Phosphatase: 69 U/L (ref 38–126)
Anion gap: 11 (ref 5–15)
BUN: 25 mg/dL — ABNORMAL HIGH (ref 8–23)
CO2: 21 mmol/L — ABNORMAL LOW (ref 22–32)
Calcium: 9.4 mg/dL (ref 8.9–10.3)
Chloride: 96 mmol/L — ABNORMAL LOW (ref 98–111)
Creatinine, Ser: 1.23 mg/dL (ref 0.61–1.24)
GFR, Estimated: >60 mL/min (ref >60)
Glucose, Bld: 115 mg/dL — ABNORMAL HIGH (ref 70–99)
Potassium: 3.9 mmol/L (ref 3.5–5.1)
Sodium: 128 mmol/L — ABNORMAL LOW (ref 135–145)
Total Bilirubin: 1.1 mg/dL (ref 0.3–1.2)
Total Protein: 8.4 g/dL — ABNORMAL HIGH (ref 6.5–8.1)

## 2023-07-14 LAB — CBC
HCT: 49.3 % (ref 39.0–52.0)
Hemoglobin: 17 g/dL (ref 13.0–17.0)
MCH: 31.2 pg (ref 26.0–34.0)
MCHC: 34.5 g/dL (ref 30.0–36.0)
MCV: 90.5 fL (ref 80.0–100.0)
Platelets: 232 10*3/uL (ref 150–400)
RBC: 5.45 MIL/uL (ref 4.22–5.81)
RDW: 13.7 % (ref 11.5–15.5)
WBC: 8.1 10*3/uL (ref 4.0–10.5)
nRBC: 0 % (ref 0.0–0.2)

## 2023-07-14 LAB — URINALYSIS, ROUTINE W REFLEX MICROSCOPIC
Bilirubin Urine: NEGATIVE
Glucose, UA: NEGATIVE mg/dL
Hgb urine dipstick: NEGATIVE
Ketones, ur: 5 mg/dL — AB
Leukocytes,Ua: NEGATIVE
Nitrite: NEGATIVE
Protein, ur: NEGATIVE mg/dL
Specific Gravity, Urine: 1.023 (ref 1.005–1.030)
pH: 5 (ref 5.0–8.0)

## 2023-07-14 LAB — LIPASE, BLOOD: Lipase: 30 U/L (ref 11–51)

## 2023-07-14 MED ORDER — ONDANSETRON HCL 4 MG PO TABS
4.0000 mg | ORAL_TABLET | Freq: Four times a day (QID) | ORAL | 0 refills | Status: DC
  Filled 2023-07-14: qty 12, 3d supply, fill #0

## 2023-07-14 MED ORDER — NYSTATIN 100000 UNIT/ML MT SUSP
5.0000 mL | Freq: Four times a day (QID) | OROMUCOSAL | 0 refills | Status: DC | PRN
  Filled 2023-07-14 (×2): qty 180, 9d supply, fill #0

## 2023-07-14 MED ORDER — IOHEXOL 350 MG/ML SOLN
75.0000 mL | Freq: Once | INTRAVENOUS | Status: AC | PRN
  Administered 2023-07-14: 75 mL via INTRAVENOUS

## 2023-07-14 MED ORDER — SODIUM CHLORIDE 0.9 % IV BOLUS
1000.0000 mL | Freq: Once | INTRAVENOUS | Status: AC
  Administered 2023-07-14: 1000 mL via INTRAVENOUS

## 2023-07-14 MED ORDER — ONDANSETRON HCL 4 MG PO TABS: 4.0000 mg | ORAL_TABLET | Freq: Four times a day (QID) | ORAL | 0 refills | Status: DC

## 2023-07-14 MED ORDER — NYSTATIN 100000 UNIT/ML MT SUSP: 5.0000 mL | Freq: Four times a day (QID) | OROMUCOSAL | 0 refills | Status: DC | PRN

## 2023-07-14 MED ORDER — ONDANSETRON HCL 4 MG/2ML IJ SOLN
4.0000 mg | Freq: Once | INTRAMUSCULAR | Status: AC
  Administered 2023-07-14: 4 mg via INTRAVENOUS
  Filled 2023-07-14: qty 2

## 2023-07-14 NOTE — ED Notes (Signed)
Pt tolerated PO challenge, pt was able to keep down graham crackers and 8oz of ginerale.

## 2023-07-14 NOTE — Discharge Instructions (Addendum)
Thank you for allowing [place] to be a part of your care today.  You were seen in the ED for nausea and vomiting.   I have sent a medication for nausea to the pharmacy.  You may take this every 6 hours as needed.  Increase your fluid intake over the next few days as well.  Schedule a follow up appointment with your primary care doctor.  I have sent a mouthwash to the pharmacy to help with the burning sensation on your tongue.  Please schedule a follow up appointment with a dentist about this.  I have provided a list of dental resources in the area.   Return to the ED if you develop sudden worsening of your symptoms, are unable to keep fluids down despite medication, or if you have any new concerns.    Merci de [employer] de faire partie de vos soins aujourd'hui.  Vous avez t vu aux urgences pour des [employer].   J'ai envoy un mdicament contre les [employer].  Vous pouvez le prendre toutes les 6 heures si ncessaire.  Augmentez galement votre consommation de liquide au cours des prochains jours.  Planifiez un rendez-vous de suivi avec votre mdecin traitant.  J'ai envoy un bain de bouche  la pharmacie pour [employer] sensation de brlure sur la [place].  Veuillez planifier un rendez-vous de suivi avec un dentiste  ce sujet.  J'ai fourni une liste de Scientific laboratory technician la rgion.   Retournez aux urgences si vous dveloppez une aggravation soudaine de vos symptmes, si vous tes incapable de retenir les [employer] ou si vous avez de nouvelles inquitudes.

## 2023-07-14 NOTE — ED Triage Notes (Signed)
Pt arrived via POV c/o emesis x5 today. Pt has not been able to keep water down and is afraid of being dehydrated. Pt denies any pain.

## 2023-07-14 NOTE — ED Notes (Signed)
Pt verbalized understanding of discharge instructions. Opportunity for questions provided. Pt provided a work note.

## 2023-07-14 NOTE — ED Provider Notes (Signed)
[facility] Emergency Department Provider Note MRN:  [id number]  Arrival date & time: 07/14/23     Chief Complaint   Emesis   History of Present Illness   Caleb Rivers is a 67 y.o. year-old male presents to the ED with chief complaint of vomiting.  States that he hasn't been able to keep down fluids.  Reports that he hasn't been having regular BMs.  Denies any fever.  Denies diarrhea.  States that he has tried increasing fiber in his diet by eating oatmeal.  Saw an RN at work who was concerned about dehydration.  He also states that his tongue has been burning.  Was seen back in June for the same tongue pain.  Hx of SBO and gastritis.  History provided by patient.   Review of Systems  Pertinent positive and negative review of systems noted in HPI.    Physical Exam   Vitals:   07/14/23 0049 07/14/23 0536  BP:  (!) 136/94  Pulse:  88  Resp:  16  Temp:  97.6 F (36.4 C)  SpO2: 96% 94%    CONSTITUTIONAL:  non toxic-appearing, NAD NEURO:  Alert and oriented x 3, CN 3-12 grossly intact EYES:  eyes equal and reactive ENT/NECK:  Supple, no stridor, tongue appears normal CARDIO:  normal rate, regular rhythm, appears well-perfused  PULM:  No respiratory distress, CTAB GI/GU:  non-distended, no focal tenderness MSK/SPINE:  No gross deformities, no edema, moves all extremities  SKIN:  no rash, atraumatic   *Additional and/or pertinent findings included in MDM below  Diagnostic and Interventional Summary    EKG Interpretation Date/Time:    Ventricular Rate:    PR Interval:    QRS Duration:    QT Interval:    QTC Calculation:   R Axis:      Text Interpretation:         Labs Reviewed  COMPREHENSIVE METABOLIC PANEL - Abnormal; Notable for the following components:      Result Value   Sodium 128 (*)    Chloride 96 (*)    CO2 21 (*)    Glucose, Bld 115 (*)    BUN 25 (*)    Total Protein 8.4 (*)    AST 43 (*)    All other components  within normal limits  LIPASE, BLOOD  CBC  URINALYSIS, ROUTINE W REFLEX MICROSCOPIC    No orders to display    Medications - No data to display   Procedures  /  Critical Care Procedures  ED Course and Medical Decision Making  I have reviewed the triage vital signs, the nursing notes, and pertinent available records from the EMR.  Social Determinants Affecting Complexity of Care: Patient has no clinically significant social determinants affecting this chief complaint..   ED Course:    Medical Decision Making Amount and/or Complexity of Data Reviewed Labs: ordered. Radiology: ordered.  Risk Prescription drug management.         Consultants:    Treatment and Plan: Patient signed out to oncoming team at shift change.  Plan:  Follow-up on CT.  PO challenge if CT is reassuring.    Final Clinical Impressions(s) / ED Diagnoses  No diagnosis found.  ED Discharge Orders     None         Discharge Instructions Discussed with and Provided to Patient:   Discharge Instructions   None      Roxy Horseman, Cordelia Poche 07/14/23 2952    Palumbo, April, MD 07/14/23  0640  

## 2023-07-14 NOTE — ED Provider Notes (Signed)
Accepted handoff at shift change from [employer], [place]. Please see prior provider note for more detail.   Briefly: Patient is 67 y.o. with past medical history significant for SBO and gastritis presents to the ED with vomiting.  He has not been able to keep down fluids.  Patient has been having regular bowel movements, no diarrhea.  Concern for dehydration.   DDX: concern for SBO, gastritis  Plan: follow up on CT imaging.  PO challenge if CT is reassuring.      Results for orders placed or performed during the hospital encounter of 07/14/23  Lipase, blood  Result Value Ref Range   Lipase 30 11 - 51 U/L  Comprehensive metabolic panel  Result Value Ref Range   Sodium 128 (L) 135 - 145 mmol/L   Potassium 3.9 3.5 - 5.1 mmol/L   Chloride 96 (L) 98 - 111 mmol/L   CO2 21 (L) 22 - 32 mmol/L   Glucose, Bld 115 (H) 70 - 99 mg/dL   BUN 25 (H) 8 - 23 mg/dL   Creatinine, Ser 9.56 0.61 - 1.24 mg/dL   Calcium 9.4 8.9 - 21.3 mg/dL   Total Protein 8.4 (H) 6.5 - 8.1 g/dL   Albumin 4.2 3.5 - 5.0 g/dL   AST 43 (H) 15 - 41 U/L   ALT 41 0 - 44 U/L   Alkaline Phosphatase 69 38 - 126 U/L   Total Bilirubin 1.1 0.3 - 1.2 mg/dL   GFR, Estimated >08 >65 mL/min   Anion gap 11 5 - 15  CBC  Result Value Ref Range   WBC 8.1 4.0 - 10.5 K/uL   RBC 5.45 4.22 - 5.81 MIL/uL   Hemoglobin 17.0 13.0 - 17.0 g/dL   HCT 78.4 69.6 - 29.5 %   MCV 90.5 80.0 - 100.0 fL   MCH 31.2 26.0 - 34.0 pg   MCHC 34.5 30.0 - 36.0 g/dL   RDW 28.4 13.2 - 44.0 %   Platelets 232 150 - 400 K/uL   nRBC 0.0 0.0 - 0.2 %  Urinalysis, Routine w reflex microscopic -Urine, Clean Catch  Result Value Ref Range   Color, Urine YELLOW YELLOW   APPearance CLEAR CLEAR   Specific Gravity, Urine 1.023 1.005 - 1.030   pH 5.0 5.0 - 8.0   Glucose, UA NEGATIVE NEGATIVE mg/dL   Hgb urine dipstick NEGATIVE NEGATIVE   Bilirubin Urine NEGATIVE NEGATIVE   Ketones, ur 5 (A) NEGATIVE mg/dL   Protein, ur NEGATIVE NEGATIVE mg/dL   Nitrite NEGATIVE  NEGATIVE   Leukocytes,Ua NEGATIVE NEGATIVE   No results found.  Physical Exam  BP (!) 136/94 (BP Location: Right Arm)   Pulse 88   Temp 97.6 F (36.4 C) (Oral)   Resp 16   SpO2 94%   Physical Exam Vitals and nursing note reviewed.  Constitutional:      General: He is not in acute distress.    Appearance: Normal appearance. He is not ill-appearing or diaphoretic.  HENT:     Mouth/Throat:     Lips: Pink.     Mouth: Mucous membranes are moist.     Dentition: No gum lesions.     Tongue: No lesions.     Pharynx: Oropharynx is clear.  Cardiovascular:     Rate and Rhythm: Normal rate and regular rhythm.  Pulmonary:     Effort: Pulmonary effort is normal.  Neurological:     Mental Status: He is alert. Mental status is at baseline.  Psychiatric:  Mood and Affect: Mood normal.        Behavior: Behavior normal.     Procedures  Procedures  ED Course / MDM    Medical Decision Making Amount and/or Complexity of Data Reviewed Labs: ordered. Radiology: ordered.  Risk Prescription drug management.   CT results without evidence of SBO, perforation, or other acute intraabdominal abnormality.  There is diverticulosis without diverticulitis.  Patient was able to tolerate a PO challenge of graham crackers and water.  Upon reassessment, patient reports he is feeling better.  Patient does continue to complain of burning pain in his mouth, but this has been going on "a long time".   Prescription for Zofran sent to pharmacy for nausea and vomiting.  Stressed to patient increasing fluid intake and following up with primary care provider.  Will send prescription for magic mouthwash to pharmacy as well to treat burning mouth syndrome.  Tongue is overall normal in appearance without obvious thrush or lesions.  Advised patient to follow up with a dentist.  Dental resources were provided.   The patient has been appropriately medically screened and/or stabilized in the ED. I have low  suspicion for any other emergent medical condition which would require further screening, evaluation or treatment in the ED or require inpatient management. At time of discharge the patient is hemodynamically stable and in no acute distress. I have discussed work-up results and diagnosis with patient and answered all questions. Patient is agreeable with discharge plan. We discussed strict return precautions for returning to the emergency department and they verbalized understanding.       Melton Alar R, PA-C 07/14/23 1610    Gloris Manchester, MD 07/14/23 [phone]

## 2023-07-14 NOTE — ED Notes (Signed)
ED Provider at bedside. 

## 2023-07-18 ENCOUNTER — Emergency Department (HOSPITAL_COMMUNITY): Payer: Self-pay

## 2023-07-18 ENCOUNTER — Inpatient Hospital Stay (HOSPITAL_COMMUNITY)
Admission: EM | Admit: 2023-07-18 | Discharge: 2023-07-20 | DRG: 641 | Disposition: A | Discharge status: Home / Self Care | Payer: Self-pay | Attending: Internal Medicine | Admitting: Internal Medicine

## 2023-07-18 ENCOUNTER — Encounter (HOSPITAL_COMMUNITY): Payer: Self-pay | Admitting: Emergency Medicine

## 2023-07-18 ENCOUNTER — Other Ambulatory Visit: Payer: Self-pay

## 2023-07-18 DIAGNOSIS — K5730 Diverticulosis of large intestine without perforation or abscess without bleeding: Secondary | ICD-10-CM | POA: Diagnosis present

## 2023-07-18 DIAGNOSIS — B9681 Helicobacter pylori [H. pylori] as the cause of diseases classified elsewhere: Secondary | ICD-10-CM

## 2023-07-18 DIAGNOSIS — Z8711 Personal history of peptic ulcer disease: Secondary | ICD-10-CM

## 2023-07-18 DIAGNOSIS — T887XXA Unspecified adverse effect of drug or medicament, initial encounter: Secondary | ICD-10-CM

## 2023-07-18 DIAGNOSIS — Z79899 Other long term (current) drug therapy: Secondary | ICD-10-CM

## 2023-07-18 DIAGNOSIS — K5790 Diverticulosis of intestine, part unspecified, without perforation or abscess without bleeding: Secondary | ICD-10-CM | POA: Insufficient documentation

## 2023-07-18 DIAGNOSIS — E871 Hypo-osmolality and hyponatremia: Principal | ICD-10-CM

## 2023-07-18 DIAGNOSIS — K219 Gastro-esophageal reflux disease without esophagitis: Secondary | ICD-10-CM | POA: Insufficient documentation

## 2023-07-18 DIAGNOSIS — E860 Dehydration: Secondary | ICD-10-CM | POA: Diagnosis present

## 2023-07-18 DIAGNOSIS — K5900 Constipation, unspecified: Secondary | ICD-10-CM | POA: Diagnosis present

## 2023-07-18 DIAGNOSIS — I10 Essential (primary) hypertension: Secondary | ICD-10-CM | POA: Insufficient documentation

## 2023-07-18 DIAGNOSIS — Z91010 Allergy to peanuts: Secondary | ICD-10-CM

## 2023-07-18 DIAGNOSIS — R112 Nausea with vomiting, unspecified: Secondary | ICD-10-CM | POA: Diagnosis present

## 2023-07-18 DIAGNOSIS — Z886 Allergy status to analgesic agent status: Secondary | ICD-10-CM

## 2023-07-18 LAB — COMPREHENSIVE METABOLIC PANEL
ALT: 43 U/L (ref 0–44)
AST: 52 U/L — ABNORMAL HIGH (ref 15–41)
Albumin: 4.1 g/dL (ref 3.5–5.0)
Alkaline Phosphatase: 67 U/L (ref 38–126)
Anion gap: 13 (ref 5–15)
BUN: 10 mg/dL (ref 8–23)
CO2: 22 mmol/L (ref 22–32)
Calcium: 9.4 mg/dL (ref 8.9–10.3)
Chloride: 84 mmol/L — ABNORMAL LOW (ref 98–111)
Creatinine, Ser: 1.1 mg/dL (ref 0.61–1.24)
GFR, Estimated: >60 mL/min (ref >60)
Glucose, Bld: 118 mg/dL — ABNORMAL HIGH (ref 70–99)
Potassium: 3.6 mmol/L (ref 3.5–5.1)
Sodium: 119 mmol/L — CL (ref 135–145)
Total Bilirubin: 0.7 mg/dL (ref 0.3–1.2)
Total Protein: 8.2 g/dL — ABNORMAL HIGH (ref 6.5–8.1)

## 2023-07-18 LAB — CBC
HCT: 45.3 % (ref 39.0–52.0)
Hemoglobin: 15.9 g/dL (ref 13.0–17.0)
MCH: 31.2 pg (ref 26.0–34.0)
MCHC: 35.1 g/dL (ref 30.0–36.0)
MCV: 88.8 fL (ref 80.0–100.0)
Platelets: 237 10*3/uL (ref 150–400)
RBC: 5.1 MIL/uL (ref 4.22–5.81)
RDW: 12.9 % (ref 11.5–15.5)
WBC: 9.1 10*3/uL (ref 4.0–10.5)
nRBC: 0 % (ref 0.0–0.2)

## 2023-07-18 LAB — URINALYSIS, ROUTINE W REFLEX MICROSCOPIC
Bilirubin Urine: NEGATIVE
Glucose, UA: NEGATIVE mg/dL
Hgb urine dipstick: NEGATIVE
Ketones, ur: 5 mg/dL — AB
Leukocytes,Ua: NEGATIVE
Nitrite: NEGATIVE
Protein, ur: NEGATIVE mg/dL
Specific Gravity, Urine: 1.013 (ref 1.005–1.030)
pH: 7 (ref 5.0–8.0)

## 2023-07-18 LAB — LIPASE, BLOOD: Lipase: 32 U/L (ref 11–51)

## 2023-07-18 MED ORDER — PANTOPRAZOLE SODIUM 40 MG IV SOLR
40.0000 mg | Freq: Every day | INTRAVENOUS | Status: DC
  Administered 2023-07-18 – 2023-07-19 (×2): 40 mg via INTRAVENOUS
  Filled 2023-07-18 (×2): qty 10

## 2023-07-18 MED ORDER — SODIUM CHLORIDE 0.9% FLUSH
3.0000 mL | Freq: Two times a day (BID) | INTRAVENOUS | Status: DC
  Administered 2023-07-18 – 2023-07-20 (×3): 3 mL via INTRAVENOUS

## 2023-07-18 MED ORDER — AMLODIPINE BESYLATE 5 MG PO TABS: 10.0000 mg | ORAL_TABLET | Freq: Every day | ORAL | Status: DC

## 2023-07-18 MED ORDER — SODIUM CHLORIDE 0.9% FLUSH: 3.0000 mL | INTRAVENOUS | Status: DC | PRN

## 2023-07-18 MED ORDER — SODIUM CHLORIDE 0.9 % IV SOLN: 250.0000 mL | INTRAVENOUS | Status: DC | PRN

## 2023-07-18 MED ORDER — HYDRALAZINE HCL 20 MG/ML IJ SOLN: 10.0000 mg | Freq: Four times a day (QID) | INTRAMUSCULAR | Status: DC | PRN

## 2023-07-18 MED ORDER — ENOXAPARIN SODIUM 40 MG/0.4ML IJ SOSY
40.0000 mg | PREFILLED_SYRINGE | INTRAMUSCULAR | Status: DC
  Administered 2023-07-19 – 2023-07-20 (×2): 40 mg via SUBCUTANEOUS
  Filled 2023-07-18 (×2): qty 0.4

## 2023-07-18 MED ORDER — ONDANSETRON HCL 4 MG/2ML IJ SOLN
4.0000 mg | Freq: Once | INTRAMUSCULAR | Status: AC
  Administered 2023-07-18: 4 mg via INTRAVENOUS
  Filled 2023-07-18: qty 2

## 2023-07-18 MED ORDER — ACETAMINOPHEN 500 MG PO TABS: 500.0000 mg | ORAL_TABLET | Freq: Four times a day (QID) | ORAL | Status: DC | PRN

## 2023-07-18 MED ORDER — ACETAMINOPHEN 325 MG PO TABS: 650.0000 mg | ORAL_TABLET | Freq: Four times a day (QID) | ORAL | Status: DC | PRN

## 2023-07-18 MED ORDER — SODIUM CHLORIDE 0.9 % IV SOLN: Freq: Once | INTRAVENOUS | Status: AC

## 2023-07-18 MED ORDER — ONDANSETRON HCL 4 MG/2ML IJ SOLN: 4.0000 mg | Freq: Four times a day (QID) | INTRAMUSCULAR | Status: DC | PRN

## 2023-07-18 MED ORDER — SODIUM CHLORIDE 0.9 % IV BOLUS
1000.0000 mL | Freq: Once | INTRAVENOUS | Status: AC
  Administered 2023-07-18: 1000 mL via INTRAVENOUS

## 2023-07-18 MED ORDER — IOHEXOL 350 MG/ML SOLN
75.0000 mL | Freq: Once | INTRAVENOUS | Status: AC | PRN
  Administered 2023-07-18: 75 mL via INTRAVENOUS

## 2023-07-18 MED ORDER — ONDANSETRON HCL 4 MG PO TABS: 4.0000 mg | ORAL_TABLET | Freq: Four times a day (QID) | ORAL | Status: DC | PRN

## 2023-07-18 MED ORDER — ACETAMINOPHEN 650 MG RE SUPP: 650.0000 mg | Freq: Four times a day (QID) | RECTAL | Status: DC | PRN

## 2023-07-18 MED ORDER — SODIUM CHLORIDE 0.9 % IV SOLN: INTRAVENOUS | Status: DC

## 2023-07-18 NOTE — ED Triage Notes (Signed)
Pt with abdominal pain, emesis, and not able to have a bowel movement since he was discharged from here 4 days ago.

## 2023-07-18 NOTE — ED Notes (Signed)
Date and time results received: 07/18/23 2002 (use smartphrase ".now" to insert current time)  Test: Sodium Critical Value: 119  Name of Provider Notified: Blanchie Dessert, Cordelia Poche

## 2023-07-18 NOTE — ED Provider Notes (Signed)
[place] EMERGENCY DEPARTMENT AT [facility] Provider Note   CSN: [id number] Arrival date & time: 07/18/23  1904     History  Chief Complaint  Patient presents with   Abdominal Pain    Caleb Rivers is a 67 y.o. male.   Abdominal Pain  67 year old male recently started on HCTZ present emergency room today with some fatigue and confusion no nausea or vomiting.  Found to have low sodium here.  No headaches or head injuries.  Not on any anticoagulation.  Denies any alcohol intake.  Denies any pain.  States that he has had some abdominal pain over the past few days.  He was seen in emergency room 4 days ago and had reassuring workup at that time does have a history of small bowel obstruction he states he has not had a bowel movement over the past few days and seems to have trouble recalling if he has eaten much.  No urinary symptoms.   Home Medications Prior to Admission medications   Medication Sig Start Date End Date Taking? Authorizing Provider  acetaminophen (TYLENOL) 500 MG tablet Take 1 tablet (500 mg total) by mouth every 6 (six) hours as needed. 06/19/23   Sabas Sous, MD  amLODipine (NORVASC) 5 MG tablet Take 2 tablets (10 mg total) by mouth daily. 07/08/23   Grayce Sessions, NP  hydrochlorothiazide (HYDRODIURIL) 25 MG tablet Take 1 tablet (25 mg total) by mouth daily. 07/08/23   Grayce Sessions, NP  magic mouthwash (nystatin, lidocaine, diphenhydrAMINE, alum & mag hydroxide) suspension Swish and spit 5 mLs 4 (four) times daily as needed for mouth pain. 07/14/23   Clark, Meghan R, PA-C  omeprazole (PRILOSEC) 20 MG capsule Take 1 capsule (20 mg total) by mouth daily. 07/08/23 08/07/23  Grayce Sessions, NP  ondansetron (ZOFRAN) 4 MG tablet Take 1 tablet (4 mg total) by mouth every 6 (six) hours. 07/14/23   Clark, Meghan R, PA-C  oxyCODONE (OXY IR/ROXICODONE) 5 MG immediate release tablet Take 1 tablet (5 mg total) by mouth every 6 (six) hours as needed for  breakthrough pain. Patient not taking: Reported on 07/08/2023 02/27/23   Maczis, Elmer Sow, PA-C  polyethylene glycol powder (GLYCOLAX/MIRALAX) 17 GM/SCOOP powder Take 17 g by mouth 2 (two) times daily. Until daily soft stools OTC Patient not taking: Reported on 02/26/2023 01/08/23   Roxy Horseman, PA-C      Allergies    Asa [aspirin], Nsaids, and Peanut-containing drug products    Review of Systems   Review of Systems  Gastrointestinal:  Positive for abdominal pain.    Physical Exam Updated Vital Signs BP (!) 151/90   Pulse 71   Temp 98.1 F (36.7 C) (Oral)   Resp 16   SpO2 100%  Physical Exam Vitals and nursing note reviewed.  Constitutional:      General: He is not in acute distress.    Comments: [place] language interpreter was used for the entirety of visit  Patient is quite confused, very nonlinear with his history giving.  Multiple times during the conversation [place] language interpreter clarified with this examiner that the patient seems to be confused and nonlinear.  HENT:     Head: Normocephalic and atraumatic.     Nose: Nose normal.  Eyes:     General: No scleral icterus. Cardiovascular:     Rate and Rhythm: Normal rate and regular rhythm.     Pulses: Normal pulses.     Heart sounds: Normal heart sounds.  Pulmonary:     Effort: Pulmonary effort is normal. No respiratory distress.     Breath sounds: No wheezing.  Abdominal:     Palpations: Abdomen is soft.     Tenderness: There is no abdominal tenderness.  Musculoskeletal:     Cervical back: Normal range of motion.     Right lower leg: No edema.     Left lower leg: No edema.  Skin:    General: Skin is warm and dry.     Capillary Refill: Capillary refill takes less than 2 seconds.  Neurological:     Mental Status: He is alert. Mental status is at baseline.     Comments: Moves all 4 extremities, sensation intact in all 4 extremities  Psychiatric:        Mood and Affect: Mood normal.        Behavior:  Behavior normal.     ED Results / Procedures / Treatments   Labs (all labs ordered are listed, but only abnormal results are displayed) Labs Reviewed  COMPREHENSIVE METABOLIC PANEL - Abnormal; Notable for the following components:      Result Value   Sodium 119 (*)    Chloride 84 (*)    Glucose, Bld 118 (*)    Total Protein 8.2 (*)    AST 52 (*)    All other components within normal limits  URINALYSIS, ROUTINE W REFLEX MICROSCOPIC - Abnormal; Notable for the following components:   Color, Urine AMBER (*)    Ketones, ur 5 (*)    All other components within normal limits  LIPASE, BLOOD  CBC    EKG EKG Interpretation Date/Time:  Saturday July 18 2023 20:26:26 EDT Ventricular Rate:  78 PR Interval:  161 QRS Duration:  95 QT Interval:  381 QTC Calculation: 434 R Axis:   -4  Text Interpretation: Sinus rhythm Consider right atrial enlargement nonspecific ST changes No significant change since last tracing Confirmed by Alvino Blood ([id number]) on 07/18/2023 8:34:54 PM  Radiology CT ABDOMEN PELVIS W CONTRAST  Result Date: 07/18/2023 CLINICAL DATA:  Acute abdominal pain and vomiting, initial encounter EXAM: CT ABDOMEN AND PELVIS WITH CONTRAST TECHNIQUE: Multidetector CT imaging of the abdomen and pelvis was performed using the standard protocol following bolus administration of intravenous contrast. RADIATION DOSE REDUCTION: This exam was performed according to the departmental dose-optimization program which includes automated exposure control, adjustment of the mA and/or kV according to patient size and/or use of iterative reconstruction technique. CONTRAST:  75mL OMNIPAQUE IOHEXOL 350 MG/ML SOLN COMPARISON:  07/14/2023 FINDINGS: Lower chest: No acute abnormality. Hepatobiliary: No focal liver abnormality is seen. No gallstones, gallbladder wall thickening, or biliary dilatation. Pancreas: Unremarkable. No pancreatic ductal dilatation or surrounding inflammatory changes. Spleen: Normal  in size without focal abnormality. Adrenals/Urinary Tract: Adrenal glands are within normal limits. Kidneys demonstrate a normal enhancement pattern bilaterally. No renal calculi or obstructive changes are seen. Normal excretion is noted on delayed images. The bladder is decompressed. Stomach/Bowel: Colon is predominately decompressed. No significant inflammatory changes are noted. The appendix is not well visualized. No inflammatory changes to suggest appendicitis are noted. Small bowel and stomach are within normal limits. Vascular/Lymphatic: No significant vascular findings are present. No enlarged abdominal or pelvic lymph nodes. Reproductive: Prostate is unremarkable. Other: No abdominal wall hernia or abnormality. No abdominopelvic ascites. Musculoskeletal: No acute or significant osseous findings. IMPRESSION: Mild motion artifact limits the examination although no acute abnormality is seen. Electronically Signed   By: Alcide Clever M.D.   On:  07/18/2023 22:08    Procedures .Critical Care  Performed by: Gailen Shelter, PA Authorized by: Gailen Shelter, PA   Critical care provider statement:    Critical care time (minutes):  35   Critical care time was exclusive of:  Separately billable procedures and treating other patients and teaching time   Critical care was necessary to treat or prevent imminent or life-threatening deterioration of the following conditions:  Metabolic crisis   Critical care was time spent personally by me on the following activities:  Development of treatment plan with patient or surrogate, discussions with consultants, evaluation of patient's response to treatment, examination of patient, ordering and review of laboratory studies, ordering and review of radiographic studies, ordering and performing treatments and interventions, pulse oximetry, re-evaluation of patient's condition and review of old charts   Care discussed with: admitting provider       Medications  Ordered in ED Medications  sodium chloride 0.9 % bolus 1,000 mL (1,000 mLs Intravenous New Bag/Given 07/18/23 2129)  0.9 %  sodium chloride infusion ( Intravenous New Bag/Given 07/18/23 2129)  ondansetron (ZOFRAN) injection 4 mg (4 mg Intravenous Given 07/18/23 2212)  iohexol (OMNIPAQUE) 350 MG/ML injection 75 mL (75 mLs Intravenous Contrast Given 07/18/23 2200)    ED Course/ Medical Decision Making/ A&P Clinical Course as of 07/18/23 2242  Sat Jul 18, 2023  2009 Hx of sbo and gastritis  States he's had abd pain and NV since 4 days ago was seen in ER and had normal CT at that time.   Pshx of  [WF]    Clinical Course User Index [WF] Gailen Shelter, [place]                             Medical Decision Making Amount and/or Complexity of Data Reviewed Labs: ordered. Radiology: ordered.  Risk Prescription drug management. Decision regarding hospitalization.  This patient presents to the ED for concern of vomiting, this involves a number of treatment options, and is a complaint that carries with it a moderate to high risk of complications and morbidity. A differential diagnosis was considered for the patient's symptoms which is discussed below:   The emergent differential diagnosis for vomiting includes, but is not limited to ACS/MI, Boerhaave's, DKA, Intracranial Hemorrhage, Ischemic bowel, Meningitis, Sepsis, Acute gastric dilation, Acetaminophen toxicity, Adrenal insufficiency, Appendicitis, Aspirin toxicity, Bowel obstruction/ileus, Cholecystitis, CNS tumor. Electrolyte abnormalities, Elevated ICP, Gastric outlet obstruction, Hyperemesis gravidarum, Pancreatitis, Peritonitis, Ruptured viscus, Testicular torsion/ovarian torsion, Biliary colic, Cannabinoid hyperemesis syndrome, Disulfiram effect, ETOH, Gastritis, Gastroenteritis, Gastroparesis, Hepatitis, Ibuprofen, Labyrinthitis, Migraine, Motion sickness, Narcotic withdrawal, Thyroid, Pregnancy, Peptic ulcer disease, Renal colic, and  UTI    Co morbidities: Discussed in HPI   Brief History:  67 year old male recently started on HCTZ present emergency room today with some fatigue and confusion no nausea or vomiting.  Found to have low sodium here.  No headaches or head injuries.  Not on any anticoagulation.  Denies any alcohol intake.  Denies any pain.  States that he has had some abdominal pain over the past few days.  He was seen in emergency room 4 days ago and had reassuring workup at that time does have a history of small bowel obstruction he states he has not had a bowel movement over the past few days and seems to have trouble recalling if he has eaten much.  No urinary symptoms.     EMR reviewed including pt PMHx, past surgical  history and past visits to ER.   See HPI for more details   Lab Tests:   I ordered and independently interpreted labs. Labs notable for CMP with sodium of 119, chloride of 84, normal creatinine, urinalysis dark amber otherwise unremarkable lipase within normal limits, CBC unremarkable no leukocytosis or anemia.  Urine electrolytes pending  Imaging Studies:  CT abdomen pelvis with contrast unremarkable    Cardiac Monitoring:  The patient was maintained on a cardiac monitor.  I personally viewed and interpreted the cardiac monitored which showed an underlying rhythm of: NSR EKG non-ischemic   Medicines ordered:  I ordered medication including 1 L normal saline, Zofran for nausea and hydration Reevaluation of the patient after these medicines showed that the patient improved I have reviewed the patients home medicines and have made adjustments as needed   Critical Interventions:   admission for symptomatic hyponatremia with sodium of 119   Consults/Attending Physician   I discussed this case with my attending physician who cosigned this note including patient's presenting symptoms, physical exam, and planned diagnostics and interventions. Attending physician stated  agreement with plan or made changes to plan which were implemented.  Discussed with Dr. Janalyn Shy of hospitalist service who will admit  Reevaluation:  After the interventions noted above I re-evaluated patient and found that they have :improved   Social Determinants of Health:      Problem List / ED Course:  Symptomatic hyponatremia in the setting of new HCTZ and some vomiting.  I was able to talk to patient's brother who confirmed that he has been taking his blood pressure medication.  Seems that he has not been on HCTZ or other blood pressure medication in the past.  Has had several episodes of nonbloody nonbilious emesis.  Reassuring workup. Admitted to medicine.    Dispostion:  After consideration of the diagnostic results and the patients response to treatment, I feel that the patent would benefit from admission.     Final Clinical Impression(s) / ED Diagnoses Final diagnoses:  Hyponatremia  Dehydration  Medication side effect    Rx / DC Orders ED Discharge Orders     None         Gailen Shelter, [place] 07/18/23 2347    Lonell Grandchild, MD 07/19/23 1223

## 2023-07-18 NOTE — H&P (Addendum)
History and Physical    Caleb Rivers NWG:[id number] DOB: July 06, 1956 DOA: 07/18/2023  PCP: Grayce Sessions, NP   Patient coming from: Home   Chief Complaint:  Chief Complaint  Patient presents with   Abdominal Pain    HPI:  Caleb Rivers is a 67 y.o. male with medical history significant of peptic ulcer disease, diverticulosis and SBO underwent diagnostic laparoscopy 02/25/2023 presented to emergency department complaining of nausea, vomiting with associated mild abdominal pain.  Patient reported that he started having gradual onset nausea vomiting for last few days with associated mild abdominal pain.  Denies any fever and chills.  Reported recently being started on new blood pressure regimen (hydrochlorothiazide).  Denies any alcohol drinking habit.  Denies any previous smoking.  Patient denies any headache, blurry vision, chest pain, palpitation, shortness of breath, constipation or diarrhea. Patient reported bowel movement today morning.  Per chart review patient has recent ED visit on 07/14/2023 with similar complaint of nausea, vomiting and generalized abdominal pain.  CT abdomen pelvis did not show any evidence of bowel obstruction and workup revealed slightly low sodium 128.  Patient was managed conservatively with Zofran and encouraged oral fluid intake.   ED Course:  I did initial presentation heart rate 71, respiratory 16, blood pressure 143/90 and O2 sat 100% room air. With concern for SBO CT abdomen pelvis obtained which showed colon is predominantly decompressed and no significant inflammatory change. Lipase 32 within normal range.  CMP showed low sodium 119, potassium 3.6, chloride 84, bicarb 22, blood glucose on 818, BUN 10, creatinine 1.1, calcium 9.4, GFR above 60. CBC unremarkable. UA unremarkable.  In the ED patient received NS bolus 1 L and started on NS infusion at 125 mL/h.  Hospitalist team has been consulted to admit patient for management of  hyponatremia.   Review of Systems:  Review of Systems  Constitutional:  Negative for chills, fever, malaise/fatigue and weight loss.  Respiratory:  Negative for cough.   Cardiovascular:  Negative for chest pain, palpitations and leg swelling.  Gastrointestinal:  Positive for nausea. Negative for abdominal pain, constipation, diarrhea, heartburn and vomiting.  Neurological:  Negative for dizziness, tremors, seizures, loss of consciousness, weakness and headaches.  Psychiatric/Behavioral:  The patient is not nervous/anxious.     Past Medical History:  Diagnosis Date   Constipation    Gastric ulcer    Ulcer     Past Surgical History:  Procedure Laterality Date   LAPAROSCOPY N/A 02/26/2023   Procedure: LAPAROSCOPY DIAGNOSTIC;  Surgeon: Andria Meuse, MD;  Location: [facility] OR;  Service: General;  Laterality: N/A;   LEFT HEART CATHETERIZATION WITH CORONARY ANGIOGRAM N/A 09/25/2013   Procedure: LEFT HEART CATHETERIZATION WITH CORONARY ANGIOGRAM;  Surgeon: Lennette Bihari, MD;  Location: [facility] CATH LAB;  Service: Cardiovascular;  Laterality: N/A;     reports that he has never smoked. He has never used smokeless tobacco. He reports that he does not drink alcohol and does not use drugs.  Allergies  Allergen Reactions   Asa [Aspirin] Other (See Comments)    Current and Hx of GI ulcer - per Pt and friend/translator   Nsaids Other (See Comments)    Current and Hx of GI ulcer per pt and friend/translator   Peanut-Containing Drug Products     Pain.      Family History  Problem Relation Age of Onset   Diabetes Mellitus II Neg Hx    Cancer Neg Hx     Prior to Admission medications  Medication Sig Start Date End Date Taking? Authorizing Provider  acetaminophen (TYLENOL) 500 MG tablet Take 1 tablet (500 mg total) by mouth every 6 (six) hours as needed. 06/19/23   Sabas Sous, MD  amLODipine (NORVASC) 5 MG tablet Take 2 tablets (10 mg total) by mouth daily. 07/08/23   Grayce Sessions, NP  hydrochlorothiazide (HYDRODIURIL) 25 MG tablet Take 1 tablet (25 mg total) by mouth daily. 07/08/23   Grayce Sessions, NP  magic mouthwash (nystatin, lidocaine, diphenhydrAMINE, alum & mag hydroxide) suspension Swish and spit 5 mLs 4 (four) times daily as needed for mouth pain. 07/14/23   Clark, Meghan R, PA-C  omeprazole (PRILOSEC) 20 MG capsule Take 1 capsule (20 mg total) by mouth daily. 07/08/23 08/07/23  Grayce Sessions, NP  ondansetron (ZOFRAN) 4 MG tablet Take 1 tablet (4 mg total) by mouth every 6 (six) hours. 07/14/23   Clark, Meghan R, PA-C  oxyCODONE (OXY IR/ROXICODONE) 5 MG immediate release tablet Take 1 tablet (5 mg total) by mouth every 6 (six) hours as needed for breakthrough pain. Patient not taking: Reported on 07/08/2023 02/27/23   Maczis, Elmer Sow, PA-C  polyethylene glycol powder (GLYCOLAX/MIRALAX) 17 GM/SCOOP powder Take 17 g by mouth 2 (two) times daily. Until daily soft stools OTC Patient not taking: Reported on 02/26/2023 01/08/23   Roxy Horseman, PA-C     Physical Exam: Vitals:   07/18/23 1911 07/18/23 1940 07/18/23 2030  BP: (!) 143/90  (!) 151/90  Pulse: 78  71  Resp: 18  16  Temp: 98.1 F (36.7 C)    TempSrc: Oral    SpO2: 100% 100% 100%    Physical Exam Constitutional:      General: He is not in acute distress.    Appearance: He is not ill-appearing.  Cardiovascular:     Rate and Rhythm: Normal rate and regular rhythm.  Pulmonary:     Effort: Pulmonary effort is normal.  Abdominal:     General: Bowel sounds are normal.     Palpations: Abdomen is soft.     Tenderness: There is no abdominal tenderness.  Skin:    Capillary Refill: Capillary refill takes less than 2 seconds.  Neurological:     Mental Status: He is alert and oriented to person, place, and time.  Psychiatric:        Mood and Affect: Mood is not anxious.      Labs on Admission: I have personally reviewed following labs and imaging studies  CBC: Recent Labs  Lab  07/14/23 0056 07/18/23 1919  WBC 8.1 9.1  HGB 17.0 15.9  HCT 49.3 45.3  MCV 90.5 88.8  PLT 232 237   Basic Metabolic Panel: Recent Labs  Lab 07/14/23 0056 07/18/23 1919  NA 128* 119*  K 3.9 3.6  CL 96* 84*  CO2 21* 22  GLUCOSE 115* 118*  BUN 25* 10  CREATININE 1.23 1.10  CALCIUM 9.4 9.4   GFR: Estimated Creatinine Clearance: 83.3 mL/min (by C-G formula based on SCr of 1.1 mg/dL). Liver Function Tests: Recent Labs  Lab 07/14/23 0056 07/18/23 1919  AST 43* 52*  ALT 41 43  ALKPHOS 69 67  BILITOT 1.1 0.7  PROT 8.4* 8.2*  ALBUMIN 4.2 4.1   Recent Labs  Lab 07/14/23 0056 07/18/23 1919  LIPASE 30 32   No results for input(s): "AMMONIA" in the last 168 hours. Coagulation Profile: No results for input(s): "INR", "PROTIME" in the last 168 hours. Cardiac Enzymes: No results for  input(s): "CKTOTAL", "CKMB", "CKMBINDEX", "TROPONINI", "TROPONINIHS" in the last 168 hours. BNP (last 3 results) No results for input(s): "BNP" in the last 8760 hours. HbA1C: No results for input(s): "HGBA1C" in the last 72 hours. CBG: No results for input(s): "GLUCAP" in the last 168 hours. Lipid Profile: No results for input(s): "CHOL", "HDL", "LDLCALC", "TRIG", "CHOLHDL", "LDLDIRECT" in the last 72 hours. Thyroid Function Tests: No results for input(s): "TSH", "T4TOTAL", "FREET4", "T3FREE", "THYROIDAB" in the last 72 hours. Anemia Panel: No results for input(s): "VITAMINB12", "FOLATE", "FERRITIN", "TIBC", "IRON", "RETICCTPCT" in the last 72 hours. Urine analysis:    Component Value Date/Time   COLORURINE AMBER (A) 07/18/2023 1921   APPEARANCEUR CLEAR 07/18/2023 1921   LABSPEC 1.013 07/18/2023 1921   PHURINE 7.0 07/18/2023 1921   GLUCOSEU NEGATIVE 07/18/2023 1921   HGBUR NEGATIVE 07/18/2023 1921   BILIRUBINUR NEGATIVE 07/18/2023 1921   KETONESUR 5 (A) 07/18/2023 1921   PROTEINUR NEGATIVE 07/18/2023 1921   UROBILINOGEN 0.2 08/30/2014 1922   NITRITE NEGATIVE 07/18/2023 1921    LEUKOCYTESUR NEGATIVE 07/18/2023 1921    Radiological Exams on Admission: I have personally reviewed images CT ABDOMEN PELVIS W CONTRAST  Result Date: 07/18/2023 CLINICAL DATA:  Acute abdominal pain and vomiting, initial encounter EXAM: CT ABDOMEN AND PELVIS WITH CONTRAST TECHNIQUE: Multidetector CT imaging of the abdomen and pelvis was performed using the standard protocol following bolus administration of intravenous contrast. RADIATION DOSE REDUCTION: This exam was performed according to the departmental dose-optimization program which includes automated exposure control, adjustment of the mA and/or kV according to patient size and/or use of iterative reconstruction technique. CONTRAST:  75mL OMNIPAQUE IOHEXOL 350 MG/ML SOLN COMPARISON:  07/14/2023 FINDINGS: Lower chest: No acute abnormality. Hepatobiliary: No focal liver abnormality is seen. No gallstones, gallbladder wall thickening, or biliary dilatation. Pancreas: Unremarkable. No pancreatic ductal dilatation or surrounding inflammatory changes. Spleen: Normal in size without focal abnormality. Adrenals/Urinary Tract: Adrenal glands are within normal limits. Kidneys demonstrate a normal enhancement pattern bilaterally. No renal calculi or obstructive changes are seen. Normal excretion is noted on delayed images. The bladder is decompressed. Stomach/Bowel: Colon is predominately decompressed. No significant inflammatory changes are noted. The appendix is not well visualized. No inflammatory changes to suggest appendicitis are noted. Small bowel and stomach are within normal limits. Vascular/Lymphatic: No significant vascular findings are present. No enlarged abdominal or pelvic lymph nodes. Reproductive: Prostate is unremarkable. Other: No abdominal wall hernia or abnormality. No abdominopelvic ascites. Musculoskeletal: No acute or significant osseous findings. IMPRESSION: Mild motion artifact limits the examination although no acute abnormality is seen.  Electronically Signed   By: Alcide Clever M.D.   On: 07/18/2023 22:08    EKG: My personal interpretation of EKG shows: Normal sinus rhythm and evidence of right atrial enlargement.  No ST and T wave abnormality.    Assessment/Plan: Principal Problem:   Hyponatremia Active Problems:   Nausea & vomiting   Essential hypertension   GERD (gastroesophageal reflux disease)    Assessment and Plan: Euvolemic hyponatremia Hyponatremia secondary from HCTZ and poor oral intake of solute - Serum sodium 119 on presentation.  Patient was in the ED 4 days ago 07/14/2023 when sodium was 128. -Patient coming with similar complaint of nausea, vomiting and abdominal pain for last 5 days and poor oral intake p.o..  Denies any history of alcohol use and smoking.  He has been recently started on hydrochlorothiazide 2 weeks ago.  Acute worsening of hyponatremia in the setting of hydrochlorothiazide and vomiting with associated poor oral intake. -  Checking serum osmolarity, urine osmolarity, urine sodium and TSH level - Consulted and reached out PCCM.  Discussed case with Dr. Denese Killings.   Given patient is asymptomatic and hemodynamically stable, hyponatremia can be manageable at progressive unit.  Recommended slow correction of sodium with IV NS 75 to 100 ml/h, sodium level every 4 hours and reach out to ICU if patient becomes disoriented/confused or symptomatic hyponatremic.  Appreciate input. -Continue NS 75 mL/h and check sodium level every 4 hours. -Discontinued hydrochlorothiazide.  -Continue to monitor mentation, development of any seizure and confusion. - Neurocheck every 4 hours - Continue fall precaution and seizure precaution - Admitting patient to progressive unit - Continue telemonitoring   Diverticulosis - CT abdomen from 07/14/2023 showed distal colonic diverticulosis. Stable.    History of GERD Nausea and vomiting - Patient reporting chronic nausea vomiting almost for 1 week.  He has poor  appetite and poor oral intake.  Previous history of SBO.  Lipase within normal range and no leukocytosis.  CT abdomen pelvis ruled out any obstruction this time. - Continue to monitor - Checking GI panel. - Continue to IV Protonix 40 mg daily -Continue Zofran as needed -Starting clear liquid diet and advance diet as patient tolerates.  Essential hypertension - Resume home amlodipine -Continue hydralazine as needed   DVT prophylaxis:  Lovenox Code Status:  Full Code Diet: Clear liquid diet Family Communication: Discussed treatment plan with patient.  He is French-speaking but understand [place]. Disposition Plan: Plan to discharge to home in 2 to 3 days based on improvement of the sodium level and oral food intake tolerance. Consults: PCCM Admission status:   Inpatient, Step Down Unit  Severity of Illness: The appropriate patient status for this patient is INPATIENT. Inpatient status is judged to be reasonable and necessary in order to provide the required intensity of service to ensure the patient's safety. The patient's presenting symptoms, physical exam findings, and initial radiographic and laboratory data in the context of their chronic comorbidities is felt to place them at high risk for further clinical deterioration. Furthermore, it is not anticipated that the patient will be medically stable for discharge from the hospital within 2 midnights of admission.   * I certify that at the point of admission it is my clinical judgment that the patient will require inpatient hospital care spanning beyond 2 midnights from the point of admission due to high intensity of service, high risk for further deterioration and high frequency of surveillance required.Marland Kitchen    Tereasa Coop, MD [facility]  How to contact the [facility] Attending or Consulting provider 7A - 7P or covering provider during after hours 7P -7A, for this patient.  Check the care team in [facility] and look for a)  attending/consulting [facility] provider listed and b) the [facility] team listed Log into [email or web address] and use [facility]'s universal password to access. If you do not have the password, please contact the hospital operator. Locate the [facility] provider you are looking for under [facility] and page to a number that you can be directly reached. If you still have difficulty reaching the provider, please page the [facility] (Director on Call) for the Hospitalists listed on amion for assistance.  07/18/2023, 11:28 PM

## 2023-07-19 DIAGNOSIS — I10 Essential (primary) hypertension: Secondary | ICD-10-CM

## 2023-07-19 DIAGNOSIS — K5790 Diverticulosis of intestine, part unspecified, without perforation or abscess without bleeding: Secondary | ICD-10-CM | POA: Insufficient documentation

## 2023-07-19 LAB — BASIC METABOLIC PANEL
Anion gap: 14 (ref 5–15)
Anion gap: 12 (ref 5–15)
Anion gap: 12 (ref 5–15)
BUN: 10 mg/dL (ref 8–23)
BUN: 9 mg/dL (ref 8–23)
BUN: 7 mg/dL — ABNORMAL LOW (ref 8–23)
CO2: 22 mmol/L (ref 22–32)
CO2: 22 mmol/L (ref 22–32)
CO2: 24 mmol/L (ref 22–32)
Calcium: 9 mg/dL (ref 8.9–10.3)
Calcium: 9 mg/dL (ref 8.9–10.3)
Calcium: 9.4 mg/dL (ref 8.9–10.3)
Chloride: 85 mmol/L — ABNORMAL LOW (ref 98–111)
Chloride: 89 mmol/L — ABNORMAL LOW (ref 98–111)
Chloride: 88 mmol/L — ABNORMAL LOW (ref 98–111)
Creatinine, Ser: 1.03 mg/dL (ref 0.61–1.24)
Creatinine, Ser: 0.98 mg/dL (ref 0.61–1.24)
Creatinine, Ser: 1.18 mg/dL (ref 0.61–1.24)
GFR, Estimated: >60 mL/min (ref >60)
GFR, Estimated: >60 mL/min (ref >60)
GFR, Estimated: >60 mL/min (ref >60)
Glucose, Bld: 110 mg/dL — ABNORMAL HIGH (ref 70–99)
Glucose, Bld: 106 mg/dL — ABNORMAL HIGH (ref 70–99)
Glucose, Bld: 92 mg/dL (ref 70–99)
Potassium: 3.9 mmol/L (ref 3.5–5.1)
Potassium: 3.8 mmol/L (ref 3.5–5.1)
Potassium: 3.7 mmol/L (ref 3.5–5.1)
Sodium: 121 mmol/L — ABNORMAL LOW (ref 135–145)
Sodium: 123 mmol/L — ABNORMAL LOW (ref 135–145)
Sodium: 124 mmol/L — ABNORMAL LOW (ref 135–145)

## 2023-07-19 LAB — GASTROINTESTINAL PANEL BY PCR, STOOL (REPLACES STOOL CULTURE)

## 2023-07-19 LAB — CBC
HCT: 42.9 % (ref 39.0–52.0)
Hemoglobin: 15.1 g/dL (ref 13.0–17.0)
MCH: 31.5 pg (ref 26.0–34.0)
MCHC: 35.2 g/dL (ref 30.0–36.0)
MCV: 89.4 fL (ref 80.0–100.0)
Platelets: 226 10*3/uL (ref 150–400)
RBC: 4.8 MIL/uL (ref 4.22–5.81)
RDW: 12.9 % (ref 11.5–15.5)
WBC: 8 10*3/uL (ref 4.0–10.5)
nRBC: 0 % (ref 0.0–0.2)

## 2023-07-19 LAB — RAPID URINE DRUG SCREEN, HOSP PERFORMED
Amphetamines: NOT DETECTED
Barbiturates: NOT DETECTED
Benzodiazepines: NOT DETECTED
Cocaine: NOT DETECTED
Opiates: NOT DETECTED
Tetrahydrocannabinol: NOT DETECTED

## 2023-07-19 LAB — TSH: TSH: 0.597 u[IU]/mL (ref 0.350–4.500)

## 2023-07-19 LAB — NA AND K (SODIUM & POTASSIUM), RAND UR
Potassium Urine: 12 mmol/L
Sodium, Ur: 32 mmol/L

## 2023-07-19 LAB — HEMOGLOBIN A1C
Hgb A1c MFr Bld: 5.7 % — ABNORMAL HIGH (ref 4.8–5.6)
Mean Plasma Glucose: 116.89 mg/dL

## 2023-07-19 LAB — LIPID PANEL
Cholesterol: 174 mg/dL (ref 0–200)
HDL: 38 mg/dL — ABNORMAL LOW (ref >40)
LDL Cholesterol: 129 mg/dL — ABNORMAL HIGH (ref 0–99)
Total CHOL/HDL Ratio: 4.6 RATIO
Triglycerides: 37 mg/dL (ref <150)
VLDL: 7 mg/dL (ref 0–40)

## 2023-07-19 LAB — OSMOLALITY, URINE: Osmolality, Ur: 176 mOsm/kg — ABNORMAL LOW (ref 300–900)

## 2023-07-19 LAB — OSMOLALITY: Osmolality: 259 mOsm/kg — ABNORMAL LOW (ref 275–295)

## 2023-07-19 MED ORDER — HYDRALAZINE HCL 20 MG/ML IJ SOLN: 5.0000 mg | Freq: Four times a day (QID) | INTRAMUSCULAR | Status: DC | PRN

## 2023-07-19 MED ORDER — AMLODIPINE BESYLATE 5 MG PO TABS
5.0000 mg | ORAL_TABLET | Freq: Every day | ORAL | Status: DC
  Administered 2023-07-20: 5 mg via ORAL
  Filled 2023-07-19: qty 1

## 2023-07-19 MED ORDER — SODIUM CHLORIDE 0.9 % IV SOLN: INTRAVENOUS | Status: AC

## 2023-07-19 NOTE — Progress Notes (Signed)
[facility] PROGRESS NOTE   Caleb Rivers EAV:[id number] DOB: [birth year] DOA: 07/18/2023  PCP: Grayce Sessions, NP  Brief History/Interval Summary:  67 y.o. male with medical history significant for peptic ulcer disease, diverticulosis and SBO, underwent diagnostic laparoscopy 02/25/2023 presented to emergency department complaining of nausea, vomiting with associated mild abdominal pain.  Patient reported having been constipated for several days.  CT scan of the abdomen pelvis did not show any acute findings.  Blood work did show hyponatremia.  He was hospitalized for further management.    Consultants: None  Procedures: None    Subjective/Interval History: Patient mentions that he had a bowel movement this morning and feels better.  Denies any abdominal pain nausea or vomiting.  No chest pain or shortness of breath.    Assessment/Plan:  Hyponatremia Likely due to combination of GI the symptoms as well as being on HCTZ.  HCTZ is a relatively new medication for him. Agree with normal saline infusion.  Sodium levels gradually improving.  Will recheck labs tomorrow morning.  Nausea and vomiting Likely due to constipation.  Had bowel movement this morning and feels much better.  CT scan reassuringly did not show any acute findings.  Will advance his diet today and see how he does.  Essential hypertension Continue with amlodipine.  HCTZ has been discontinued.  History of GERD Continue PPI  DVT Prophylaxis: Lovenox Code Status: Full code Family Communication: Discussed with patient Disposition Plan: Mobilize.  Anticipate discharge tomorrow.  Status is: Inpatient Remains inpatient appropriate because: Hyponatremia, nausea vomiting      Medications: Scheduled:  amLODipine  10 mg Oral Daily   enoxaparin (LOVENOX) injection  40 mg Subcutaneous Q24H   pantoprazole (PROTONIX) IV  40 mg Intravenous Daily   sodium chloride flush  3 mL Intravenous Q12H    Continuous:  sodium chloride     sodium chloride     NWG:NFAOZH chloride, acetaminophen **OR** acetaminophen, hydrALAZINE, ondansetron **OR** ondansetron (ZOFRAN) IV, sodium chloride flush  Antibiotics: Anti-infectives (From admission, onward)    None       Objective:  Vital Signs  Vitals:   07/19/23 0030 07/19/23 0200 07/19/23 0400 07/19/23 0526  BP: 125/73 135/69 106/68 109/70  Pulse:  74  71  Resp: 20 18 17 18   Temp:  98.3 F (36.8 C)  98.3 F (36.8 C)  TempSrc:  Oral  Oral  SpO2:  98%  98%    Intake/Output Summary (Last 24 hours) at 07/19/2023 0843 Last data filed at 07/19/2023 0453 Gross per 24 hour  Intake 1741.54 ml  Output --  Net 1741.54 ml   There were no vitals filed for this visit.  General appearance: Awake alert.  In no distress Resp: Clear to auscultation bilaterally.  Normal effort Cardio: S1-S2 is normal regular.  No S3-S4.  No rubs murmurs or bruit GI: Abdomen is soft.  Nontender nondistended.  Bowel sounds are present normal.  No masses organomegaly Extremities: No edema.  Full range of motion of lower extremities. Neurologic: Alert and oriented x3.  No focal neurological deficits.    Lab Results:  Data Reviewed: I have personally reviewed following labs and reports of the imaging studies  CBC: Recent Labs  Lab 07/14/23 0056 07/18/23 1919 07/19/23 0247  WBC 8.1 9.1 8.0  HGB 17.0 15.9 15.1  HCT 49.3 45.3 42.9  MCV 90.5 88.8 89.4  PLT 232 237 226    Basic Metabolic Panel: Recent Labs  Lab 07/14/23 0056 07/18/23 1919 07/19/23 0036 07/19/23 0247  NA 128* 119* 121* 123*  K 3.9 3.6 3.9 3.8  CL 96* 84* 85* 89*  CO2 21* 22 22 22   GLUCOSE 115* 118* 110* 106*  BUN 25* 10 10 9   CREATININE 1.23 1.10 1.03 0.98  CALCIUM 9.4 9.4 9.0 9.0    GFR: Estimated Creatinine Clearance: 93.4 mL/min (by C-G formula based on SCr of 0.98 mg/dL).  Liver Function Tests: Recent Labs  Lab 07/14/23 0056 07/18/23 1919  AST 43* 52*  ALT 41 43   ALKPHOS 69 67  BILITOT 1.1 0.7  PROT 8.4* 8.2*  ALBUMIN 4.2 4.1    Recent Labs  Lab 07/14/23 0056 07/18/23 1919  LIPASE 30 32     HbA1C: Recent Labs    07/18/23 1919  HGBA1C 5.7*     Lipid Profile: Recent Labs    07/19/23 0247  CHOL 174  HDL 38*  LDLCALC 129*  TRIG 37  CHOLHDL 4.6    Thyroid Function Tests: Recent Labs    07/19/23 0036  TSH 0.597     Radiology Studies: CT ABDOMEN PELVIS W CONTRAST  Result Date: 07/18/2023 CLINICAL DATA:  Acute abdominal pain and vomiting, initial encounter EXAM: CT ABDOMEN AND PELVIS WITH CONTRAST TECHNIQUE: Multidetector CT imaging of the abdomen and pelvis was performed using the standard protocol following bolus administration of intravenous contrast. RADIATION DOSE REDUCTION: This exam was performed according to the departmental dose-optimization program which includes automated exposure control, adjustment of the mA and/or kV according to patient size and/or use of iterative reconstruction technique. CONTRAST:  75mL OMNIPAQUE IOHEXOL 350 MG/ML SOLN COMPARISON:  07/14/2023 FINDINGS: Lower chest: No acute abnormality. Hepatobiliary: No focal liver abnormality is seen. No gallstones, gallbladder wall thickening, or biliary dilatation. Pancreas: Unremarkable. No pancreatic ductal dilatation or surrounding inflammatory changes. Spleen: Normal in size without focal abnormality. Adrenals/Urinary Tract: Adrenal glands are within normal limits. Kidneys demonstrate a normal enhancement pattern bilaterally. No renal calculi or obstructive changes are seen. Normal excretion is noted on delayed images. The bladder is decompressed. Stomach/Bowel: Colon is predominately decompressed. No significant inflammatory changes are noted. The appendix is not well visualized. No inflammatory changes to suggest appendicitis are noted. Small bowel and stomach are within normal limits. Vascular/Lymphatic: No significant vascular findings are present. No  enlarged abdominal or pelvic lymph nodes. Reproductive: Prostate is unremarkable. Other: No abdominal wall hernia or abnormality. No abdominopelvic ascites. Musculoskeletal: No acute or significant osseous findings. IMPRESSION: Mild motion artifact limits the examination although no acute abnormality is seen. Electronically Signed   By: Alcide Clever M.D.   On: 07/18/2023 22:08       LOS: 1 day   Osvaldo Shipper  [facility] Pager on [email or web address]  07/19/2023, 8:43 AM

## 2023-07-19 NOTE — ED Notes (Signed)
PT ambulates without difficulty.  AO x 4.  Tolerated liquid diet without difficulty.  Has had 2 bm's since arrival and is very happy.  States he took a very strong "African laxitive" before he came and it's finally working!

## 2023-07-19 NOTE — ED Notes (Signed)
MD notified of abnormal GI panel result.  States no further actions needed at this time.

## 2023-07-19 NOTE — Plan of Care (Signed)

## 2023-07-19 NOTE — ED Notes (Signed)
ED TO INPATIENT HANDOFF REPORT  ED Nurse Name and Phone #: [phone]  S Name/Age/Gender Caleb Rivers 67 y.o. male Room/Bed: 003C/003C  Code Status   Code Status: Full Code  Home/SNF/Other Home Patient oriented to: self, place, time, and situation Is this baseline? Yes   Triage Complete: Triage complete  Chief Complaint Hyponatremia [E87.1]  Triage Note Pt with abdominal pain, emesis, and not able to have a bowel movement since he was discharged from here 4 days ago.     Allergies Allergies  Allergen Reactions   Asa [Aspirin] Other (See Comments)    Current and Hx of GI ulcer - per Pt and friend/translator   Nsaids Other (See Comments)    Current and Hx of GI ulcer per pt and friend/translator   Peanut-Containing Drug Products     Pain.      Level of Care/Admitting Diagnosis ED Disposition     ED Disposition  Admit   Condition  --   Comment  Hospital Area: [facility] [[id number]]  Level of Care: Progressive [102]  Admit to Progressive based on following criteria: MULTISYSTEM THREATS such as stable sepsis, metabolic/electrolyte imbalance with or without encephalopathy that is responding to early treatment.  May admit patient to [name] or [facility] if equivalent level of care is available:: No  Covid Evaluation: Asymptomatic - no recent exposure (last 10 days) testing not required  Diagnosis: Hyponatremia [454098]  Admitting Physician: Tereasa Coop [[id number]]  Attending Physician: Tereasa Coop [[id number]]  Certification:: I certify this patient will need inpatient services for at least 2 midnights  Estimated Length of Stay: 5          B Medical/Surgery History Past Medical History:  Diagnosis Date   Constipation    Gastric ulcer    Ulcer    Past Surgical History:  Procedure Laterality Date   LAPAROSCOPY N/A 02/26/2023   Procedure: LAPAROSCOPY DIAGNOSTIC;  Surgeon: Andria Meuse, MD;  Location: [facility] OR;  Service:  General;  Laterality: N/A;   LEFT HEART CATHETERIZATION WITH CORONARY ANGIOGRAM N/A 09/25/2013   Procedure: LEFT HEART CATHETERIZATION WITH CORONARY ANGIOGRAM;  Surgeon: Lennette Bihari, MD;  Location: [facility];  Service: Cardiovascular;  Laterality: N/A;     A IV Location/Drains/Wounds Patient Lines/Drains/Airways Status     Active Line/Drains/Airways     Name Placement date Placement time Site Days   Peripheral IV 07/18/23 20 G Anterior;Left Forearm 07/18/23  2122  Forearm  1   Incision - 3 Ports Umbilicus Left;Lateral Left;Lateral 02/26/23  1020  -- 143            Intake/Output Last 24 hours  Intake/Output Summary (Last 24 hours) at 07/19/2023 1634 Last data filed at 07/19/2023 0453 Gross per 24 hour  Intake 1741.54 ml  Output --  Net 1741.54 ml    Labs/Imaging Results for orders placed or performed during the hospital encounter of 07/18/23 (from the past 48 hour(s))  Lipase, blood     Status: None   Collection Time: 07/18/23  7:19 PM  Result Value Ref Range   Lipase 32 11 - 51 U/L    Comment: Performed at [facility], [address] [place], [place] [address]  Comprehensive metabolic panel     Status: Abnormal   Collection Time: 07/18/23  7:19 PM  Result Value Ref Range   Sodium 119 (LL) 135 - 145 mmol/L    Comment: CRITICAL RESULT CALLED TO, READ BACK BY AND VERIFIED WITH E,TEASLEY RN @  2003 07/18/23 E,BENTON   Potassium 3.6 3.5 - 5.1 mmol/L   Chloride 84 (L) 98 - 111 mmol/L   CO2 22 22 - 32 mmol/L   Glucose, Bld 118 (H) 70 - 99 mg/dL    Comment: Glucose reference range applies only to samples taken after fasting for at least 8 hours.   BUN 10 8 - 23 mg/dL   Creatinine, Ser 1.51 0.61 - 1.24 mg/dL   Calcium 9.4 8.9 - 76.1 mg/dL   Total Protein 8.2 (H) 6.5 - 8.1 g/dL   Albumin 4.1 3.5 - 5.0 g/dL   AST 52 (H) 15 - 41 U/L   ALT 43 0 - 44 U/L   Alkaline Phosphatase 67 38 - 126 U/L   Total Bilirubin 0.7 0.3 - 1.2 mg/dL   GFR, Estimated >60 >73 mL/min     Comment: (NOTE) Calculated using the CKD-EPI Creatinine Equation (2021)    Anion gap 13 5 - 15    Comment: Performed at [facility], [address] [place], [place] [address]  CBC     Status: None   Collection Time: 07/18/23  7:19 PM  Result Value Ref Range   WBC 9.1 4.0 - 10.5 K/uL   RBC 5.10 4.22 - 5.81 MIL/uL   Hemoglobin 15.9 13.0 - 17.0 g/dL   HCT 69.4 85.4 - 62.7 %   MCV 88.8 80.0 - 100.0 fL   MCH 31.2 26.0 - 34.0 pg   MCHC 35.1 30.0 - 36.0 g/dL   RDW 03.5 00.9 - 38.1 %   Platelets 237 150 - 400 K/uL   nRBC 0.0 0.0 - 0.2 %    Comment: Performed at [facility], [address] [place], [place] [address]  Hemoglobin A1c     Status: Abnormal   Collection Time: 07/18/23  7:19 PM  Result Value Ref Range   Hgb A1c MFr Bld 5.7 (H) 4.8 - 5.6 %    Comment: (NOTE) Pre diabetes:          5.7%-6.4%  Diabetes:              >6.4%  Glycemic control for   <7.0% adults with diabetes    Mean Plasma Glucose 116.89 mg/dL    Comment: Performed at [facility], [address] [place], [place] [address]  Urinalysis, Routine w reflex microscopic -Urine, Clean Catch     Status: Abnormal   Collection Time: 07/18/23  7:21 PM  Result Value Ref Range   Color, Urine AMBER (A) YELLOW    Comment: BIOCHEMICALS MAY BE AFFECTED BY COLOR   APPearance CLEAR CLEAR   Specific Gravity, Urine 1.013 1.005 - 1.030   pH 7.0 5.0 - 8.0   Glucose, UA NEGATIVE NEGATIVE mg/dL   Hgb urine dipstick NEGATIVE NEGATIVE   Bilirubin Urine NEGATIVE NEGATIVE   Ketones, ur 5 (A) NEGATIVE mg/dL   Protein, ur NEGATIVE NEGATIVE mg/dL   Nitrite NEGATIVE NEGATIVE   Leukocytes,Ua NEGATIVE NEGATIVE    Comment: Performed at [facility], [address], [place], [place] [address]  Basic metabolic panel     Status: Abnormal   Collection Time: 07/19/23 12:36 AM  Result Value Ref Range   Sodium 121 (L) 135 - 145 mmol/L   Potassium 3.9 3.5 - 5.1 mmol/L   Chloride 85 (L) 98 - 111 mmol/L   CO2 22 22  - 32 mmol/L   Glucose, Bld 110 (H) 70 - 99 mg/dL    Comment:  Glucose reference range applies only to samples taken after fasting for at least 8 hours.   BUN 10 8 - 23 mg/dL   Creatinine, Ser 1.61 0.61 - 1.24 mg/dL   Calcium 9.0 8.9 - 09.6 mg/dL   GFR, Estimated >04 >54 mL/min    Comment: (NOTE) Calculated using the CKD-EPI Creatinine Equation (2021)    Anion gap 14 5 - 15    Comment: Performed at [facility], [address], [place], [place] [address]  Osmolality     Status: Abnormal   Collection Time: 07/19/23 12:36 AM  Result Value Ref Range   Osmolality 259 (L) 275 - 295 mOsm/kg    Comment: Performed at [facility], [address], [place], [place] [address]  TSH     Status: None   Collection Time: 07/19/23 12:36 AM  Result Value Ref Range   TSH 0.597 0.350 - 4.500 uIU/mL    Comment: Performed by a 3rd Generation assay with a functional sensitivity of <=0.01 uIU/mL. Performed at [facility], [address], [place], [place] [address]   Basic metabolic panel     Status: Abnormal   Collection Time: 07/19/23  2:47 AM  Result Value Ref Range   Sodium 123 (L) 135 - 145 mmol/L   Potassium 3.8 3.5 - 5.1 mmol/L   Chloride 89 (L) 98 - 111 mmol/L   CO2 22 22 - 32 mmol/L   Glucose, Bld 106 (H) 70 - 99 mg/dL    Comment: Glucose reference range applies only to samples taken after fasting for at least 8 hours.   BUN 9 8 - 23 mg/dL   Creatinine, Ser 1.30 0.61 - 1.24 mg/dL   Calcium 9.0 8.9 - 86.5 mg/dL   GFR, Estimated >78 >46 mL/min    Comment: (NOTE) Calculated using the CKD-EPI Creatinine Equation (2021)    Anion gap 12 5 - 15    Comment: Performed at [facility], [address] [place], [place] [address]  CBC     Status: None   Collection Time: 07/19/23  2:47 AM  Result Value Ref Range   WBC 8.0 4.0 - 10.5 K/uL   RBC 4.80 4.22 - 5.81 MIL/uL   Hemoglobin 15.1 13.0 - 17.0 g/dL   HCT 28.4 13.2 - 44.0 %   MCV 89.4 80.0 - 100.0 fL   MCH 31.5 26.0 -  34.0 pg   MCHC 35.2 30.0 - 36.0 g/dL   RDW 10.2 72.5 - 36.6 %   Platelets 226 150 - 400 K/uL   nRBC 0.0 0.0 - 0.2 %    Comment: Performed at [facility], [address] [place], [place] [address]  Lipid panel     Status: Abnormal   Collection Time: 07/19/23  2:47 AM  Result Value Ref Range   Cholesterol 174 0 - 200 mg/dL   Triglycerides 37 <742 mg/dL   HDL 38 (L) >59 mg/dL   Total CHOL/HDL Ratio 4.6 RATIO   VLDL 7 0 - 40 mg/dL   LDL Cholesterol 563 (H) 0 - 99 mg/dL    Comment:        Total Cholesterol/HDL:CHD Risk Coronary Heart Disease Risk Table                     Men   Women  1/2 Average Risk   3.4   3.3  Average Risk       5.0   4.4  2 X Average Risk   9.6   7.1  3 X Average Risk  23.4   11.0        Use the calculated Patient Ratio above and the CHD Risk Table to determine the patient's CHD Risk.        ATP III CLASSIFICATION (LDL):  <100     mg/dL   Optimal  161-096  mg/dL   Near or Above                    Optimal  130-159  mg/dL   Borderline  045-409  mg/dL   High  >811     mg/dL   Very High Performed at [facility], [address] [place], [place] [address]   Gastrointestinal Panel by PCR , Stool     Status: Abnormal   Collection Time: 07/19/23  6:01 AM   Specimen: Stool  Result Value Ref Range   Campylobacter species NOT DETECTED NOT DETECTED   Plesimonas shigelloides NOT DETECTED NOT DETECTED   Salmonella species NOT DETECTED NOT DETECTED   Yersinia enterocolitica NOT DETECTED NOT DETECTED   Vibrio species NOT DETECTED NOT DETECTED   Vibrio cholerae NOT DETECTED NOT DETECTED   Enteroaggregative E coli (EAEC) NOT DETECTED NOT DETECTED   Enterotoxigenic E coli (ETEC) NOT DETECTED NOT DETECTED   Shiga like toxin producing E coli (STEC) DETECTED (A) NOT DETECTED    Comment: RESULT CALLED TO, READ BACK BY AND VERIFIED WITH:  Zinia Innocent 1357 07/19/2023 CP    E. coli O157 NOT DETECTED NOT DETECTED   Shigella/Enteroinvasive E coli (EIEC)  NOT DETECTED NOT DETECTED   Cryptosporidium NOT DETECTED NOT DETECTED   Cyclospora cayetanensis NOT DETECTED NOT DETECTED   Entamoeba histolytica NOT DETECTED NOT DETECTED   Giardia lamblia NOT DETECTED NOT DETECTED   Adenovirus F40/41 NOT DETECTED NOT DETECTED   Astrovirus NOT DETECTED NOT DETECTED   Norovirus GI/GII NOT DETECTED NOT DETECTED   Rotavirus A NOT DETECTED NOT DETECTED   Sapovirus (I, II, IV, and V) NOT DETECTED NOT DETECTED    Comment: Performed at [facility], [address] [place], [place] [address]  Osmolality, urine     Status: Abnormal   Collection Time: 07/19/23  7:40 AM  Result Value Ref Range   Osmolality, Ur 176 (L) 300 - 900 mOsm/kg    Comment: Performed at [facility], [address] [place], [place] [address]  Na and K (sodium & potassium), rand urine     Status: None   Collection Time: 07/19/23  7:40 AM  Result Value Ref Range   Sodium, Ur 32 mmol/L   Potassium Urine 12 mmol/L    Comment: Performed at [facility], [address], [place], [place] [address]  Rapid urine drug screen (hospital performed)     Status: None   Collection Time: 07/19/23  7:41 AM  Result Value Ref Range   Opiates NONE DETECTED NONE DETECTED   Cocaine NONE DETECTED NONE DETECTED   Benzodiazepines NONE DETECTED NONE DETECTED   Amphetamines NONE DETECTED NONE DETECTED   Tetrahydrocannabinol NONE DETECTED NONE DETECTED   Barbiturates NONE DETECTED NONE DETECTED    Comment: (NOTE) DRUG SCREEN FOR MEDICAL PURPOSES ONLY.  IF CONFIRMATION IS NEEDED FOR ANY PURPOSE, NOTIFY LAB WITHIN 5 DAYS.  LOWEST DETECTABLE LIMITS FOR URINE DRUG SCREEN Drug Class  Cutoff (ng/mL) Amphetamine and metabolites    1000 Barbiturate and metabolites    200 Benzodiazepine                 200 Opiates and metabolites        300 Cocaine and metabolites        300 THC                            50 Performed at [facility], [address],  [place], [place] [address]   Basic metabolic panel     Status: Abnormal   Collection Time: 07/19/23  8:00 AM  Result Value Ref Range   Sodium 124 (L) 135 - 145 mmol/L   Potassium 3.7 3.5 - 5.1 mmol/L   Chloride 88 (L) 98 - 111 mmol/L   CO2 24 22 - 32 mmol/L   Glucose, Bld 92 70 - 99 mg/dL    Comment: Glucose reference range applies only to samples taken after fasting for at least 8 hours.   BUN 7 (L) 8 - 23 mg/dL   Creatinine, Ser 1.91 0.61 - 1.24 mg/dL   Calcium 9.4 8.9 - 47.8 mg/dL   GFR, Estimated >29 >56 mL/min    Comment: (NOTE) Calculated using the CKD-EPI Creatinine Equation (2021)    Anion gap 12 5 - 15    Comment: Performed at [facility], [address], [place], [place] [address]   CT ABDOMEN PELVIS W CONTRAST  Result Date: 07/18/2023 CLINICAL DATA:  Acute abdominal pain and vomiting, initial encounter EXAM: CT ABDOMEN AND PELVIS WITH CONTRAST TECHNIQUE: Multidetector CT imaging of the abdomen and pelvis was performed using the standard protocol following bolus administration of intravenous contrast. RADIATION DOSE REDUCTION: This exam was performed according to the departmental dose-optimization program which includes automated exposure control, adjustment of the mA and/or kV according to patient size and/or use of iterative reconstruction technique. CONTRAST:  75mL OMNIPAQUE IOHEXOL 350 MG/ML SOLN COMPARISON:  07/14/2023 FINDINGS: Lower chest: No acute abnormality. Hepatobiliary: No focal liver abnormality is seen. No gallstones, gallbladder wall thickening, or biliary dilatation. Pancreas: Unremarkable. No pancreatic ductal dilatation or surrounding inflammatory changes. Spleen: Normal in size without focal abnormality. Adrenals/Urinary Tract: Adrenal glands are within normal limits. Kidneys demonstrate a normal enhancement pattern bilaterally. No renal calculi or obstructive changes are seen. Normal excretion is noted on delayed images. The bladder is decompressed.  Stomach/Bowel: Colon is predominately decompressed. No significant inflammatory changes are noted. The appendix is not well visualized. No inflammatory changes to suggest appendicitis are noted. Small bowel and stomach are within normal limits. Vascular/Lymphatic: No significant vascular findings are present. No enlarged abdominal or pelvic lymph nodes. Reproductive: Prostate is unremarkable. Other: No abdominal wall hernia or abnormality. No abdominopelvic ascites. Musculoskeletal: No acute or significant osseous findings. IMPRESSION: Mild motion artifact limits the examination although no acute abnormality is seen. Electronically Signed   By: Alcide Clever M.D.   On: 07/18/2023 22:08    Pending Labs Unresulted Labs (From admission, onward)     Start     Ordered   07/20/23 0500  CBC  Tomorrow morning,   R        07/19/23 0843   07/20/23 0500  Basic metabolic panel  Tomorrow morning,   R        07/19/23 0843   07/19/23 0601  Miscellaneous test (send-out)  Once,   R  07/19/23 0601   07/18/23 2306  H. pylori antigen, stool  Once,   R        07/18/23 2305            Vitals/Pain Today's Vitals   07/19/23 1316 07/19/23 1515 07/19/23 1545 07/19/23 1615  BP: 139/77 125/74 112/65 120/75  Pulse: 77     Resp: (!) 24 (!) 26 16 17   Temp:      TempSrc:      SpO2: 100%     PainSc:        Isolation Precautions Enteric precautions (UV disinfection)  Medications Medications  enoxaparin (LOVENOX) injection 40 mg (40 mg Subcutaneous Given 07/19/23 1258)  sodium chloride flush (NS) 0.9 % injection 3 mL (3 mLs Intravenous Not Given 07/19/23 1258)  sodium chloride flush (NS) 0.9 % injection 3 mL (has no administration in time range)  0.9 %  sodium chloride infusion (has no administration in time range)  acetaminophen (TYLENOL) tablet 650 mg (has no administration in time range)    Or  acetaminophen (TYLENOL) suppository 650 mg (has no administration in time range)  ondansetron (ZOFRAN)  tablet 4 mg (has no administration in time range)    Or  ondansetron (ZOFRAN) injection 4 mg (has no administration in time range)  pantoprazole (PROTONIX) injection 40 mg (40 mg Intravenous Given 07/18/23 2356)  0.9 %  sodium chloride infusion ( Intravenous Rate/Dose Change 07/19/23 0937)  amLODipine (NORVASC) tablet 5 mg (has no administration in time range)  hydrALAZINE (APRESOLINE) injection 5 mg (has no administration in time range)  sodium chloride 0.9 % bolus 1,000 mL (0 mLs Intravenous Stopped 07/18/23 2257)  0.9 %  sodium chloride infusion (0 mLs Intravenous Stopped 07/18/23 2355)  ondansetron (ZOFRAN) injection 4 mg (4 mg Intravenous Given 07/18/23 2212)  iohexol (OMNIPAQUE) 350 MG/ML injection 75 mL (75 mLs Intravenous Contrast Given 07/18/23 2200)    Mobility walks     Focused Assessments Neuro Assessment Handoff:  Swallow screen pass?  No swallow performed         Neuro Assessment: Within Defined Limits Neuro Checks:      Has TPA been given? No If patient is a Neuro Trauma and patient is going to OR before floor call report to 4N Charge nurse: [phone] or [phone]   R Recommendations: See Admitting Provider Note  Report given to:   Additional Notes: Pt ao x 4.  Has been ambulating to bathroom.  Feeds himself.

## 2023-07-19 NOTE — Progress Notes (Signed)
  X-cover Note:  GI panel shows : Shiga like toxin producing E coli (STEC)  On enteric precautions.  Carollee Herter, DO [facility]

## 2023-07-20 ENCOUNTER — Other Ambulatory Visit (HOSPITAL_COMMUNITY): Payer: Self-pay

## 2023-07-20 DIAGNOSIS — E871 Hypo-osmolality and hyponatremia: Secondary | ICD-10-CM

## 2023-07-20 LAB — BASIC METABOLIC PANEL
Anion gap: 5 (ref 5–15)
BUN: 18 mg/dL (ref 8–23)
CO2: 23 mmol/L (ref 22–32)
Calcium: 8.7 mg/dL — ABNORMAL LOW (ref 8.9–10.3)
Chloride: 102 mmol/L (ref 98–111)
Creatinine, Ser: 1.18 mg/dL (ref 0.61–1.24)
GFR, Estimated: >60 mL/min (ref >60)
Glucose, Bld: 92 mg/dL (ref 70–99)
Potassium: 4.5 mmol/L (ref 3.5–5.1)
Sodium: 130 mmol/L — ABNORMAL LOW (ref 135–145)

## 2023-07-20 LAB — CBC
HCT: 45.5 % (ref 39.0–52.0)
Hemoglobin: 15.3 g/dL (ref 13.0–17.0)
MCH: 31.4 pg (ref 26.0–34.0)
MCHC: 33.6 g/dL (ref 30.0–36.0)
MCV: 93.4 fL (ref 80.0–100.0)
Platelets: 205 10*3/uL (ref 150–400)
RBC: 4.87 MIL/uL (ref 4.22–5.81)
RDW: 13.5 % (ref 11.5–15.5)
WBC: 6.8 10*3/uL (ref 4.0–10.5)
nRBC: 0 % (ref 0.0–0.2)

## 2023-07-20 MED ORDER — POLYETHYLENE GLYCOL 3350 17 GM/SCOOP PO POWD
17.0000 g | Freq: Every day | ORAL | 0 refills | Status: AC
  Filled 2023-07-20: qty 476, 28d supply, fill #0

## 2023-07-20 NOTE — Discharge Summary (Signed)
[facility]  Physician Discharge Summary   Patient ID: Caleb Rivers MRN: [id number] DOB/AGE: 1956/09/06 67 y.o.  Admit date: 07/18/2023 Discharge date: 07/20/2023    PCP: Grayce Sessions, NP  DISCHARGE DIAGNOSES:    Hyponatremia   Nausea & vomiting   Essential hypertension   GERD (gastroesophageal reflux disease)   Diverticulosis   RECOMMENDATIONS FOR OUTPATIENT FOLLOW UP: Outpatient follow-up with PCP in 1 week   Home Health: None Equipment/Devices: None  CODE STATUS: Full code  DISCHARGE CONDITION: fair  Diet recommendation: As before  INITIAL HISTORY: 67 y.o. male with medical history significant for peptic ulcer disease, diverticulosis and SBO, underwent diagnostic laparoscopy 02/25/2023 presented to emergency department complaining of nausea, vomiting with associated mild abdominal pain.  Patient reported having been constipated for several days.  CT scan of the abdomen pelvis did not show any acute findings.  Blood work did show hyponatremia.  He was hospitalized for further management.     HOSPITAL COURSE:   Hyponatremia Likely due to combination of GI the symptoms as well as being on HCTZ.  HCTZ is a relatively new medication for him.  Patient was given IV fluids with improvement in his sodium level.  Sodium is up to 130 this morning.  Hydrochlorothiazide will be discontinued.   Nausea and vomiting Likely due to constipation.  Bowel movement in the hospital.  CT scan reassuringly did not show any acute findings.  His diet was gradually advanced and he tolerated it well.  Even though he was constipated at admission a GI pathogen panel was ordered. After he was given laxatives he had a BM and the pathogen panel showed STEC. Significance of this unclear as patient does not have diarrhea. He was feeling much better at the time of discharge. No further testing or treatment at this time.   Essential hypertension Continue with amlodipine.  HCTZ has been  discontinued.   History of GERD Continue PPI  Patient is stable.  Feels better.  Okay for discharge home today.   PERTINENT LABS:  The results of significant diagnostics from this hospitalization (including imaging, microbiology, ancillary and laboratory) are listed below for reference.    Microbiology: Recent Results (from the past 240 hour(s))  Gastrointestinal Panel by PCR , Stool     Status: Abnormal   Collection Time: 07/19/23  6:01 AM   Specimen: Stool  Result Value Ref Range Status   Campylobacter species NOT DETECTED NOT DETECTED Final   Plesimonas shigelloides NOT DETECTED NOT DETECTED Final   Salmonella species NOT DETECTED NOT DETECTED Final   Yersinia enterocolitica NOT DETECTED NOT DETECTED Final   Vibrio species NOT DETECTED NOT DETECTED Final   Vibrio cholerae NOT DETECTED NOT DETECTED Final   Enteroaggregative E coli (EAEC) NOT DETECTED NOT DETECTED Final   Enterotoxigenic E coli (ETEC) NOT DETECTED NOT DETECTED Final   Shiga like toxin producing E coli (STEC) DETECTED (A) NOT DETECTED Final    Comment: RESULT CALLED TO, READ BACK BY AND VERIFIED WITH:  BRENDA MICHELSON [id number] 07/19/2023 CP    E. coli O157 NOT DETECTED NOT DETECTED Final   Shigella/Enteroinvasive E coli (EIEC) NOT DETECTED NOT DETECTED Final   Cryptosporidium NOT DETECTED NOT DETECTED Final   Cyclospora cayetanensis NOT DETECTED NOT DETECTED Final   Entamoeba histolytica NOT DETECTED NOT DETECTED Final   Giardia lamblia NOT DETECTED NOT DETECTED Final   Adenovirus F40/41 NOT DETECTED NOT DETECTED Final   Astrovirus NOT DETECTED NOT DETECTED Final   Norovirus GI/GII NOT DETECTED  NOT DETECTED Final   Rotavirus A NOT DETECTED NOT DETECTED Final   Sapovirus (I, II, IV, and V) NOT DETECTED NOT DETECTED Final    Comment: Performed at [facility], [address] [place], [place] [address]     Labs:   Basic Metabolic Panel: Recent Labs  Lab 07/18/23 1919 07/19/23 0036 07/19/23 0247  07/19/23 0800 07/20/23 0445  NA 119* 121* 123* 124* 130*  K 3.6 3.9 3.8 3.7 4.5  CL 84* 85* 89* 88* 102  CO2 22 22 22 24 23   GLUCOSE 118* 110* 106* 92 92  BUN 10 10 9  7* 18  CREATININE 1.10 1.03 0.98 1.18 1.18  CALCIUM 9.4 9.0 9.0 9.4 8.7*   Liver Function Tests: Recent Labs  Lab 07/14/23 0056 07/18/23 1919  AST 43* 52*  ALT 41 43  ALKPHOS 69 67  BILITOT 1.1 0.7  PROT 8.4* 8.2*  ALBUMIN 4.2 4.1   Recent Labs  Lab 07/14/23 0056 07/18/23 1919  LIPASE 30 32    CBC: Recent Labs  Lab 07/14/23 0056 07/18/23 1919 07/19/23 0247 07/20/23 0445  WBC 8.1 9.1 8.0 6.8  HGB 17.0 15.9 15.1 15.3  HCT 49.3 45.3 42.9 45.5  MCV 90.5 88.8 89.4 93.4  PLT 232 237 226 205     IMAGING STUDIES CT ABDOMEN PELVIS W CONTRAST  Result Date: 07/18/2023 CLINICAL DATA:  Acute abdominal pain and vomiting, initial encounter EXAM: CT ABDOMEN AND PELVIS WITH CONTRAST TECHNIQUE: Multidetector CT imaging of the abdomen and pelvis was performed using the standard protocol following bolus administration of intravenous contrast. RADIATION DOSE REDUCTION: This exam was performed according to the departmental dose-optimization program which includes automated exposure control, adjustment of the mA and/or kV according to patient size and/or use of iterative reconstruction technique. CONTRAST:  75mL OMNIPAQUE IOHEXOL 350 MG/ML SOLN COMPARISON:  07/14/2023 FINDINGS: Lower chest: No acute abnormality. Hepatobiliary: No focal liver abnormality is seen. No gallstones, gallbladder wall thickening, or biliary dilatation. Pancreas: Unremarkable. No pancreatic ductal dilatation or surrounding inflammatory changes. Spleen: Normal in size without focal abnormality. Adrenals/Urinary Tract: Adrenal glands are within normal limits. Kidneys demonstrate a normal enhancement pattern bilaterally. No renal calculi or obstructive changes are seen. Normal excretion is noted on delayed images. The bladder is decompressed.  Stomach/Bowel: Colon is predominately decompressed. No significant inflammatory changes are noted. The appendix is not well visualized. No inflammatory changes to suggest appendicitis are noted. Small bowel and stomach are within normal limits. Vascular/Lymphatic: No significant vascular findings are present. No enlarged abdominal or pelvic lymph nodes. Reproductive: Prostate is unremarkable. Other: No abdominal wall hernia or abnormality. No abdominopelvic ascites. Musculoskeletal: No acute or significant osseous findings. IMPRESSION: Mild motion artifact limits the examination although no acute abnormality is seen. Electronically Signed   By: Alcide Clever M.D.   On: 07/18/2023 22:08   CT ABDOMEN PELVIS W CONTRAST  Result Date: 07/14/2023 CLINICAL DATA:  Bowel obstruction suspected.  Vomiting. EXAM: CT ABDOMEN AND PELVIS WITH CONTRAST TECHNIQUE: Multidetector CT imaging of the abdomen and pelvis was performed using the standard protocol following bolus administration of intravenous contrast. RADIATION DOSE REDUCTION: This exam was performed according to the departmental dose-optimization program which includes automated exposure control, adjustment of the mA and/or kV according to patient size and/or use of iterative reconstruction technique. CONTRAST:  75mL OMNIPAQUE IOHEXOL 350 MG/ML SOLN COMPARISON:  02/24/2023 FINDINGS: Lower chest:  No contributory findings. Hepatobiliary: No focal liver abnormality.No evidence of biliary obstruction or stone. Pancreas: Unremarkable. Spleen: Unremarkable. Adrenals/Urinary  Tract: Negative adrenals. No hydronephrosis or stone. Unremarkable bladder. Stomach/Bowel: No obstruction. No appendicitis. Mild distal colonic diverticulosis. Vascular/Lymphatic: No acute vascular abnormality. Atheromatous wall thickening of the aorta and iliacs. No mass or adenopathy. Reproductive:No pathologic findings. Other: No ascites or pneumoperitoneum. Musculoskeletal: No acute abnormalities.  IMPRESSION: No bowel obstruction or visible inflammation. Mild atherosclerosis and distal colonic diverticulosis. Electronically Signed   By: Tiburcio Pea M.D.   On: 07/14/2023 07:18    DISCHARGE EXAMINATION: Vitals:   07/19/23 2036 07/20/23 0034 07/20/23 0448 07/20/23 0905  BP: 121/65 133/88 138/80 126/81  Pulse: 70 92 100 85  Resp: 18 18 18 18   Temp: 98.1 F (36.7 C) 98.5 F (36.9 C) 98.2 F (36.8 C) 98.4 F (36.9 C)  TempSrc: Oral Oral Oral Oral  SpO2: 99% 100% 97% 97%  Weight:   96.1 kg   Height:       General appearance: Awake alert.  In no distress Resp: Clear to auscultation bilaterally.  Normal effort Cardio: S1-S2 is normal regular.  No S3-S4.  No rubs murmurs or bruit GI: Abdomen is soft.  Nontender nondistended.  Bowel sounds are present normal.  No masses organomegaly   DISPOSITION: Home  Discharge Instructions     Call MD for:  difficulty breathing, headache or visual disturbances   Complete by: As directed    Call MD for:  extreme fatigue   Complete by: As directed    Call MD for:  persistant dizziness or light-headedness   Complete by: As directed    Call MD for:  persistant nausea and vomiting   Complete by: As directed    Call MD for:  severe uncontrolled pain   Complete by: As directed    Call MD for:  temperature >100.4   Complete by: As directed    Diet - low sodium heart healthy   Complete by: As directed    Discharge instructions   Complete by: As directed    Please take your medications as prescribed.  Please be sure to follow-up with your primary care provider within 1 week.  You were cared for by a hospitalist during your hospital stay. If you have any questions about your discharge medications or the care you received while you were in the hospital after you are discharged, you can call the unit and asked to speak with the hospitalist on call if the hospitalist that took care of you is not available. Once you are discharged, your primary care  physician will handle any further medical issues. Please note that NO REFILLS for any discharge medications will be authorized once you are discharged, as it is imperative that you return to your primary care physician (or establish a relationship with a primary care physician if you do not have one) for your aftercare needs so that they can reassess your need for medications and monitor your lab values. If you do not have a primary care physician, you can call [phone] for a physician referral.   Increase activity slowly   Complete by: As directed          Allergies as of 07/20/2023       Reactions   Asa [aspirin] Other (See Comments)   Current and Hx of GI ulcer - per Pt and friend/translator   Nsaids Other (See Comments)   Current and Hx of GI ulcer per pt and friend/translator   Peanut-containing Drug Products    Pain.          Medication List  STOP taking these medications    hydrochlorothiazide 25 MG tablet Commonly known as: HYDRODIURIL   oxyCODONE 5 MG immediate release tablet Commonly known as: Oxy IR/ROXICODONE       TAKE these medications    acetaminophen 500 MG tablet Commonly known as: TYLENOL Take 1 tablet (500 mg total) by mouth every 6 (six) hours as needed. What changed:  how much to take when to take this reasons to take this   amLODipine 5 MG tablet Commonly known as: NORVASC Take 2 tablets (10 mg total) by mouth daily.   magic mouthwash (nystatin, lidocaine, diphenhydrAMINE, alum & mag hydroxide) suspension Swish and spit 5 mLs 4 (four) times daily as needed for mouth pain.   omeprazole 20 MG capsule Commonly known as: PRILOSEC Take 1 capsule (20 mg total) by mouth daily.   ondansetron 4 MG tablet Commonly known as: ZOFRAN Take 1 tablet (4 mg total) by mouth every 6 (six) hours.   polyethylene glycol powder 17 GM/SCOOP powder Commonly known as: GLYCOLAX/MIRALAX Mix 17 g as directed and take by mouth daily until daily soft stools  OTC What changed:  when to take this additional instructions          Follow-up Information     Grayce Sessions, NP. Schedule an appointment as soon as possible for a visit in 1 week(s).   Specialty: Internal Medicine Why: post hospitalization follow up Contact information: [address] [place] [address] [phone]                 TOTAL DISCHARGE TIME: 35 minutes  Lora Glomski Rito Ehrlich  [facility] Pager on [email or web address]  07/20/2023, 5:26 PM

## 2023-07-21 ENCOUNTER — Other Ambulatory Visit: Payer: Self-pay | Admitting: Family Medicine

## 2023-07-21 ENCOUNTER — Telehealth (INDEPENDENT_AMBULATORY_CARE_PROVIDER_SITE_OTHER): Payer: Self-pay

## 2023-07-21 ENCOUNTER — Other Ambulatory Visit: Payer: Self-pay

## 2023-07-21 ENCOUNTER — Other Ambulatory Visit (HOSPITAL_COMMUNITY): Payer: Self-pay

## 2023-07-21 LAB — H. PYLORI ANTIGEN, STOOL: H. Pylori Stool Ag, Eia: POSITIVE — AB

## 2023-07-21 MED ORDER — CLARITHROMYCIN 500 MG PO TABS
500.0000 mg | ORAL_TABLET | Freq: Two times a day (BID) | ORAL | 0 refills | Status: DC
Start: 2023-07-21 — End: 2024-02-16
  Filled 2023-07-21 (×2): qty 28, 14d supply, fill #0

## 2023-07-21 MED ORDER — AMOXICILLIN 500 MG PO CAPS
1000.0000 mg | ORAL_CAPSULE | Freq: Two times a day (BID) | ORAL | 0 refills | Status: DC
Start: 2023-07-21 — End: 2024-02-16
  Filled 2023-07-21 (×2): qty 56, 14d supply, fill #0

## 2023-07-21 NOTE — Transitions of Care (Post Inpatient/ED Visit) (Unsigned)
   07/21/2023  Name: Caleb Rivers MRN: [id number] DOB: [birth year]  Today's TOC FU Call Status: Today's TOC FU Call Status:: Unsuccessul Call (1st Attempt) Unsuccessful Call (1st Attempt) Date: 07/21/23  Attempted to reach the patient regarding the most recent Inpatient/ED visit.  Follow Up Plan: Additional outreach attempts will be made to reach the patient to complete the Transitions of Care (Post Inpatient/ED visit) call.   Signature   Woodfin Ganja LPN [facility] Nurse Health Advisor Direct Dial [phone]

## 2023-07-22 ENCOUNTER — Telehealth (INDEPENDENT_AMBULATORY_CARE_PROVIDER_SITE_OTHER): Payer: Self-pay

## 2023-07-22 NOTE — Telephone Encounter (Signed)
Contacted pt to go over lab results pt is aware and doesn't have any questions or concerns 

## 2023-07-22 NOTE — Transitions of Care (Post Inpatient/ED Visit) (Unsigned)
   07/22/2023  Name: Caleb Rivers MRN: [id number] DOB: 1956-04-25  Today's TOC FU Call Status: Today's TOC FU Call Status:: Unsuccessful Call (2nd Attempt) Unsuccessful Call (1st Attempt) Date: 07/21/23 Unsuccessful Call (2nd Attempt) Date: 07/22/23  Attempted to reach the patient regarding the most recent Inpatient/ED visit.  Follow Up Plan: Additional outreach attempts will be made to reach the patient to complete the Transitions of Care (Post Inpatient/ED visit) call.   Signature   Woodfin Ganja LPN [facility] Nurse Health Advisor Direct Dial [phone]

## 2023-07-23 NOTE — Transitions of Care (Post Inpatient/ED Visit) (Signed)
   07/23/2023  Name: Caleb Rivers MRN: [id number] DOB: [birth year]  Today's TOC FU Call Status: Today's TOC FU Call Status:: Successful TOC FU Call Competed Unsuccessful Call (1st Attempt) Date: 07/21/23 Unsuccessful Call (2nd Attempt) Date: 07/22/23 Unsuccessful Call (3rd Attempt) Date: 07/23/23 [facility] FU Call Complete Date: 07/23/23  Attempted to reach the patient regarding the most recent Inpatient/ED visit.  Follow Up Plan: No further outreach attempts will be made at this time. We have been unable to contact the patient.  Signature   Woodfin Ganja LPN [facility] Nurse Health Advisor Direct Dial [phone]

## 2023-07-29 ENCOUNTER — Other Ambulatory Visit: Payer: Self-pay

## 2023-07-29 ENCOUNTER — Encounter (INDEPENDENT_AMBULATORY_CARE_PROVIDER_SITE_OTHER): Payer: Self-pay | Admitting: Primary Care

## 2023-07-29 ENCOUNTER — Telehealth: Payer: Self-pay

## 2023-07-29 ENCOUNTER — Ambulatory Visit (INDEPENDENT_AMBULATORY_CARE_PROVIDER_SITE_OTHER): Payer: Self-pay | Admitting: Primary Care

## 2023-07-29 VITALS — BP 129/82 | HR 80 | Resp 16 | Wt 211.8 lb

## 2023-07-29 DIAGNOSIS — Z0130 Encounter for examination of blood pressure without abnormal findings: Secondary | ICD-10-CM

## 2023-07-29 DIAGNOSIS — Z09 Encounter for follow-up examination after completed treatment for conditions other than malignant neoplasm: Secondary | ICD-10-CM

## 2023-07-29 NOTE — Progress Notes (Signed)
[facility]   Subjective:   Caleb Rivers is a 67 y.o. male presents for hospital follow up. Engineer, petroleum) Presented  to the ED with Patient reported that he started having gradual onset nausea vomiting for last few days with associated mild abdominal pain. Admit date to the hospital was 07/18/23, patient was discharged from the hospital on 07/20/23,Dx Nausea & vomiting , Hyponatremia , Essential hypertension , GERD (gastroesophageal reflux disease) Diverticulosis. Initial appt was for Bp f/u wnl- Patient has No headache, No chest pain, No abdominal pain - No Nausea, No new weakness tingling or numbness, No Cough - shortness of breath  Past Medical History:  Diagnosis Date   Constipation    Gastric ulcer    Ulcer      Allergies  Allergen Reactions   Asa [Aspirin] Other (See Comments)    Current and Hx of GI ulcer - per Pt and friend/translator   Nsaids Other (See Comments)    Current and Hx of GI ulcer per pt and friend/translator   Peanut-Containing Drug Products     Pain.        Current Outpatient Medications on File Prior to Visit  Medication Sig Dispense Refill   acetaminophen (TYLENOL) 500 MG tablet Take 1 tablet (500 mg total) by mouth every 6 (six) hours as needed. (Patient taking differently: Take 1,000 mg by mouth daily as needed for moderate pain.) 30 tablet 0   amLODipine (NORVASC) 5 MG tablet Take 2 tablets (10 mg total) by mouth daily. 90 tablet 1   amoxicillin (AMOXIL) 500 MG capsule Take 2 capsules (1,000 mg total) by mouth 2 (two) times daily. 56 capsule 0   clarithromycin (BIAXIN) 500 MG tablet Take 1 tablet (500 mg total) by mouth 2 (two) times daily. 28 tablet 0   magic mouthwash (nystatin, lidocaine, diphenhydrAMINE, alum & mag hydroxide) suspension Swish and spit 5 mLs 4 (four) times daily as needed for mouth pain. 180 mL 0   ondansetron (ZOFRAN) 4 MG tablet Take 1 tablet (4 mg total) by mouth every 6 (six) hours. 12 tablet 0    polyethylene glycol powder (GLYCOLAX/MIRALAX) 17 GM/SCOOP powder Mix 17 g as directed and take by mouth daily until daily soft stools OTC 510 g 0   No current facility-administered medications on file prior to visit.     Review of System: Comprehensive ROS Pertinent positive and negative noted in HPI   Objective:  Blood Pressure 129/82 (BP Location: Left Arm, Patient Position: Sitting, Cuff Size: Large)   Pulse 80   Respiration 16   Weight 211 lb 12.8 oz (96.1 kg)   Oxygen Saturation 97%   Body Mass Index 25.12 kg/m   Filed Weights   07/29/23 1000  Weight: 211 lb 12.8 oz (96.1 kg)   Physical Exam: General Appearance: Well nourished, in no apparent distress. Eyes: PERRLA, EOMs,  Sinuses: No Frontal/maxillary tenderness ENT/Mouth: Ext aud canals clear, Neck: Supple, thyroid normal.  Respiratory: Respiratory effort normal, BS equal bilaterally without rales, rhonchi, wheezing or stridor.  Cardio: RRR with no MRGs. Brisk peripheral pulses without edema.  Abdomen: Soft, + BS.  Non tender, no guarding, rebound, hernias, masses. Skin: Warm, dry without rashes, lesions, ecchymosis.  Neuro: Cranial nerves intact. Normal muscle tone, no cerebellar symptoms. Sensation intact.  Psych: Awake and oriented X 3, normal affect, Insight and Judgment appropriate.    Assessment:   Cord was seen today for hospitalization follow-up.  Diagnoses and all orders for this visit:  BP check  Wnl Explained that having normal blood pressure is the goal and medications are helping to get to goal and maintain normal blood pressure. DIET: Limit salt intake, read nutrition labels to check salt content, limit fried and high fatty foods  Avoid using multisymptom OTC cold preparations that generally contain sudafed which can rise BP. Consult with pharmacist on best cold relief products to use for persons with HTN EXERCISE Discussed incorporating exercise such as walking - 30 minutes most days of the week  and can do in 10 minute intervals     Hospital discharge follow-up Still taking abt's for H pyloric     This note has been created with Education officer, environmental. Any transcriptional errors are unintentional.   Grayce Sessions, NP 07/29/2023, 10:22 AM

## 2023-07-29 NOTE — Telephone Encounter (Signed)
Received call from patient stating " I'm at the pharmacy on [address] and there are no medications." A representative from the pharmacy advised he was there to get his magic mouthwash rx. Per chart review patient was prescribed on 07/14/23 magic mouthwash (nystatin, lidocaine, diphenhydrAMINE, alum & mag hydroxide) suspension with no refills. Patient will need to see his PCP to get additional prescriptions.   No additional TOC needs.

## 2023-07-31 ENCOUNTER — Other Ambulatory Visit: Payer: Self-pay

## 2023-07-31 ENCOUNTER — Other Ambulatory Visit (HOSPITAL_COMMUNITY): Payer: Self-pay

## 2023-07-31 ENCOUNTER — Emergency Department (HOSPITAL_COMMUNITY)
Admission: EM | Admit: 2023-07-31 | Discharge: 2023-07-31 | Disposition: A | Discharge status: Home / Self Care | Payer: Self-pay | Attending: Emergency Medicine | Admitting: Emergency Medicine

## 2023-07-31 DIAGNOSIS — E871 Hypo-osmolality and hyponatremia: Secondary | ICD-10-CM | POA: Insufficient documentation

## 2023-07-31 DIAGNOSIS — I10 Essential (primary) hypertension: Secondary | ICD-10-CM | POA: Insufficient documentation

## 2023-07-31 DIAGNOSIS — Z79899 Other long term (current) drug therapy: Secondary | ICD-10-CM | POA: Insufficient documentation

## 2023-07-31 DIAGNOSIS — Z91010 Allergy to peanuts: Secondary | ICD-10-CM | POA: Insufficient documentation

## 2023-07-31 LAB — CBC WITH DIFFERENTIAL/PLATELET
Abs Immature Granulocytes: 0.06 10*3/uL (ref 0.00–0.07)
Basophils Absolute: 0 10*3/uL (ref 0.0–0.1)
Basophils Relative: 1 %
Eosinophils Absolute: 0.1 10*3/uL (ref 0.0–0.5)
Eosinophils Relative: 1 %
HCT: 44 % (ref 39.0–52.0)
Hemoglobin: 15.2 g/dL (ref 13.0–17.0)
Immature Granulocytes: 1 %
Lymphocytes Relative: 33 %
Lymphs Abs: 2.3 10*3/uL (ref 0.7–4.0)
MCH: 31.1 pg (ref 26.0–34.0)
MCHC: 34.5 g/dL (ref 30.0–36.0)
MCV: 90.2 fL (ref 80.0–100.0)
Monocytes Absolute: 0.8 10*3/uL (ref 0.1–1.0)
Monocytes Relative: 12 %
Neutro Abs: 3.6 10*3/uL (ref 1.7–7.7)
Neutrophils Relative %: 52 %
Platelets: 256 10*3/uL (ref 150–400)
RBC: 4.88 MIL/uL (ref 4.22–5.81)
RDW: 13.6 % (ref 11.5–15.5)
WBC: 6.9 10*3/uL (ref 4.0–10.5)
nRBC: 0 % (ref 0.0–0.2)

## 2023-07-31 LAB — COMPREHENSIVE METABOLIC PANEL
ALT: 80 U/L — ABNORMAL HIGH (ref 0–44)
AST: 59 U/L — ABNORMAL HIGH (ref 15–41)
Albumin: 4.1 g/dL (ref 3.5–5.0)
Alkaline Phosphatase: 64 U/L (ref 38–126)
Anion gap: 10 (ref 5–15)
BUN: 19 mg/dL (ref 8–23)
CO2: 20 mmol/L — ABNORMAL LOW (ref 22–32)
Calcium: 9.2 mg/dL (ref 8.9–10.3)
Chloride: 93 mmol/L — ABNORMAL LOW (ref 98–111)
Creatinine, Ser: 1.08 mg/dL (ref 0.61–1.24)
GFR, Estimated: >60 mL/min (ref >60)
Glucose, Bld: 106 mg/dL — ABNORMAL HIGH (ref 70–99)
Potassium: 3.7 mmol/L (ref 3.5–5.1)
Sodium: 123 mmol/L — ABNORMAL LOW (ref 135–145)
Total Bilirubin: 0.8 mg/dL (ref 0.3–1.2)
Total Protein: 8 g/dL (ref 6.5–8.1)

## 2023-07-31 LAB — BASIC METABOLIC PANEL
Anion gap: 8 (ref 5–15)
BUN: 15 mg/dL (ref 8–23)
CO2: 23 mmol/L (ref 22–32)
Calcium: 8.7 mg/dL — ABNORMAL LOW (ref 8.9–10.3)
Chloride: 98 mmol/L (ref 98–111)
Creatinine, Ser: 1.09 mg/dL (ref 0.61–1.24)
GFR, Estimated: >60 mL/min (ref >60)
Glucose, Bld: 99 mg/dL (ref 70–99)
Potassium: 3.5 mmol/L (ref 3.5–5.1)
Sodium: 129 mmol/L — ABNORMAL LOW (ref 135–145)

## 2023-07-31 MED ORDER — NYSTATIN 100000 UNIT/ML MT SUSP: 5.0000 mL | Freq: Four times a day (QID) | OROMUCOSAL | 0 refills | Status: DC | PRN

## 2023-07-31 MED ORDER — NYSTATIN 100000 UNIT/ML MT SUSP
5.0000 mL | Freq: Four times a day (QID) | OROMUCOSAL | 0 refills | Status: DC | PRN
  Filled 2023-07-31: qty 180, 9d supply, fill #0

## 2023-07-31 MED ORDER — OMEPRAZOLE 20 MG PO CPDR: 20.0000 mg | DELAYED_RELEASE_CAPSULE | Freq: Every day | ORAL | 0 refills | Status: DC

## 2023-07-31 MED ORDER — SODIUM CHLORIDE 0.9 % IV BOLUS
1000.0000 mL | Freq: Once | INTRAVENOUS | Status: AC
  Administered 2023-07-31: 1000 mL via INTRAVENOUS

## 2023-07-31 MED ORDER — OMEPRAZOLE 20 MG PO CPDR
20.0000 mg | DELAYED_RELEASE_CAPSULE | Freq: Every day | ORAL | 0 refills | Status: DC
  Filled 2023-07-31: qty 30, 30d supply, fill #0

## 2023-07-31 NOTE — ED Triage Notes (Addendum)
Patient reports constipation with mild nausea , last BM 2 days ago , no fever or diarrhea .

## 2023-07-31 NOTE — ED Provider Notes (Signed)
[place] EMERGENCY DEPARTMENT AT [facility] Provider Note  CSN: [id number] Arrival date & time: 07/31/23 4098  Chief Complaint(s) Constipation  HPI Caleb Rivers is a 67 y.o. male history of hypertension, constipation presenting with constipation.  Patient reports constipation for the past 2 days.  He reports occasional nausea but has been eating and drinking normally.  No vomiting.  Still passing gas.  No abdominal pain.  No confusion, tremors, seizures.  Otherwise feels well.  Has been taking MiraLAX not improvement.  No fevers or chills, urinary symptoms.  Notably patient was recently hospitalized with hyponatremia.  This was thought to be due to his HCTZ.  Patient reports that he is continue to take this medicine as he did not know he was to stop it.  [place] interpreter used.    Past Medical History Past Medical History:  Diagnosis Date   Constipation    Gastric ulcer    Ulcer    Patient Active Problem List   Diagnosis Date Noted   Diverticulosis 07/19/2023   Hyponatremia 07/18/2023   Essential hypertension 07/18/2023   GERD (gastroesophageal reflux disease) 07/18/2023   History of peptic ulcer disease 09/05/2019   SBO (small bowel obstruction) (HCC) 08/30/2014   Abdominal pain, generalized 08/30/2014   Nausea & vomiting 08/30/2014   Abnormal resting ECG findings - repolarization changes; NOT STEMI 09/26/2013   Gastritis 09/25/2013   Home Medication(s) Prior to Admission medications   Medication Sig Start Date End Date Taking? Authorizing Provider  acetaminophen (TYLENOL) 500 MG tablet Take 1 tablet (500 mg total) by mouth every 6 (six) hours as needed. Patient taking differently: Take 1,000 mg by mouth daily as needed for moderate pain. 06/19/23   Sabas Sous, MD  amLODipine (NORVASC) 5 MG tablet Take 2 tablets (10 mg total) by mouth daily. 07/08/23   Grayce Sessions, NP  amoxicillin (AMOXIL) 500 MG capsule Take 2 capsules (1,000 mg total) by mouth 2  (two) times daily. 07/21/23   Hoy Register, MD  clarithromycin (BIAXIN) 500 MG tablet Take 1 tablet (500 mg total) by mouth 2 (two) times daily. 07/21/23   Hoy Register, MD  magic mouthwash (nystatin, lidocaine, diphenhydrAMINE, alum & mag hydroxide) suspension Swish and spit 5 mLs 4 (four) times daily as needed for mouth pain. 07/31/23   Lonell Grandchild, MD  omeprazole (PRILOSEC) 20 MG capsule Take 1 capsule (20 mg total) by mouth daily. 07/31/23   Lonell Grandchild, MD  ondansetron (ZOFRAN) 4 MG tablet Take 1 tablet (4 mg total) by mouth every 6 (six) hours. 07/14/23   Clark, Meghan R, PA-C  polyethylene glycol powder (GLYCOLAX/MIRALAX) 17 GM/SCOOP powder Mix 17 g as directed and take by mouth daily until daily soft stools OTC 07/20/23 08/19/23  Osvaldo Shipper, MD  Past Surgical History Past Surgical History:  Procedure Laterality Date   LAPAROSCOPY N/A 02/26/2023   Procedure: LAPAROSCOPY DIAGNOSTIC;  Surgeon: Andria Meuse, MD;  Location: [facility] OR;  Service: General;  Laterality: N/A;   LEFT HEART CATHETERIZATION WITH CORONARY ANGIOGRAM N/A 09/25/2013   Procedure: LEFT HEART CATHETERIZATION WITH CORONARY ANGIOGRAM;  Surgeon: Lennette Bihari, MD;  Location: [facility] CATH LAB;  Service: Cardiovascular;  Laterality: N/A;   Family History Family History  Problem Relation Age of Onset   Diabetes Mellitus II Neg Hx    Cancer Neg Hx     Social History Social History   Tobacco Use   Smoking status: Never   Smokeless tobacco: Never  Substance Use Topics   Alcohol use: No   Drug use: No   Allergies Asa [aspirin], Nsaids, and Peanut-containing drug products  Review of Systems Review of Systems  All other systems reviewed and are negative.   Physical Exam Vital Signs  I have reviewed the triage vital signs BP 136/81   Pulse 72   Temp 98.6 F (37 C)  (Oral)   Resp 18   SpO2 100%  Physical Exam Vitals and nursing note reviewed.  Constitutional:      General: He is not in acute distress.    Appearance: Normal appearance.  HENT:     Mouth/Throat:     Mouth: Mucous membranes are moist.  Eyes:     Conjunctiva/sclera: Conjunctivae normal.  Cardiovascular:     Rate and Rhythm: Normal rate and regular rhythm.  Pulmonary:     Effort: Pulmonary effort is normal. No respiratory distress.     Breath sounds: Normal breath sounds.  Abdominal:     General: Abdomen is flat.     Palpations: Abdomen is soft.     Tenderness: There is no abdominal tenderness.  Musculoskeletal:     Right lower leg: No edema.     Left lower leg: No edema.  Skin:    General: Skin is warm and dry.     Capillary Refill: Capillary refill takes less than 2 seconds.  Neurological:     Mental Status: He is alert and oriented to person, place, and time. Mental status is at baseline.  Psychiatric:        Mood and Affect: Mood normal.        Behavior: Behavior normal.     ED Results and Treatments Labs (all labs ordered are listed, but only abnormal results are displayed) Labs Reviewed  COMPREHENSIVE METABOLIC PANEL - Abnormal; Notable for the following components:      Result Value   Sodium 123 (*)    Chloride 93 (*)    CO2 20 (*)    Glucose, Bld 106 (*)    AST 59 (*)    ALT 80 (*)    All other components within normal limits  BASIC METABOLIC PANEL - Abnormal; Notable for the following components:   Sodium 129 (*)    Calcium 8.7 (*)    All other components within normal limits  CBC WITH DIFFERENTIAL/PLATELET  Radiology No results found.  Pertinent labs & imaging results that were available during my care of the patient were reviewed by me and considered in my medical decision making (see MDM for details).  Medications Ordered in  ED Medications  sodium chloride 0.9 % bolus 1,000 mL (0 mLs Intravenous Stopped 07/31/23 1218)                                                                                                                                     Procedures Procedures  (including critical care time)  Medical Decision Making / ED Course   MDM:  67 year old male presenting to the emergency department with constipation without abdominal pain or vomiting.  Physical exam reassuring.  No abdominal tenderness or distention.  He reports chronic constipation and this is similar.  Doubt acute process such as bowel obstruction, perforation, diverticulitis.  Laboratory test notable for sodium of 123.  This is decreased from his recent hospitalization.  He reports that he has continued to take HCTZ although according to documentation this was discontinued.  The patient reports he did not know he was to stop it.  Patient denies any symptoms, such as seizures, tremors, confusion and his constipation is a chronic issue for him.  Will give IV fluids, recheck sodium.  If improving, given likely cause is known, likely safe for discharge with follow-up with primary care doctor for recheck early next week. Patient understands need to stop hydrochlorothiazide. Clinical Course as of 07/31/23 1345  Fri Jul 31, 2023  1216 Sodium(!): 129 [WS]  1235 Repeat sodium is improved to 129. [WS]  1341 Reassessed patient, he reports he feels much better after getting IV fluids.  He no longer feels constipated and was able to have multiple bowel movements.  Not sure if hyponatremia is actually caused the patient's constipation but he does feel better.  Emphasized again the need to stop taking the HCTZ since this is what is causing him to have low sodium.  Patient also asked about "burning sensation in my mouth" refilled patient's prescription for Magic mouthwash, reviewed chart and patient was recently diagnosed with H. pylori and should still be  taking medication to help eradicate H. pylori, however it appears the patient is not taking his antibiotics (per patient report) and he is also not taking any antiacid.  I have refilled his antiacid and instructed him to complete his course of antibiotics for H. pylori treatment. Will discharge patient to home. All questions answered. Patient comfortable with plan of discharge. Return precautions discussed with patient and specified on the after visit summary.  [WS]    Clinical Course User Index [WS] Lonell Grandchild, MD     Additional history obtained:  -External records from outside source obtained and reviewed including: Chart review including previous notes, labs, imaging, consultation notes including recent discharge summary    Lab Tests: -I ordered, reviewed, and interpreted labs.  The pertinent results include:   Labs Reviewed  COMPREHENSIVE METABOLIC PANEL - Abnormal; Notable for the following components:      Result Value   Sodium 123 (*)    Chloride 93 (*)    CO2 20 (*)    Glucose, Bld 106 (*)    AST 59 (*)    ALT 80 (*)    All other components within normal limits  BASIC METABOLIC PANEL - Abnormal; Notable for the following components:   Sodium 129 (*)    Calcium 8.7 (*)    All other components within normal limits  CBC WITH DIFFERENTIAL/PLATELET    Notable for hyponatremia, improved on re-check, mild AST/ALT abnormalities discussed with patient   Medicines ordered and prescription drug management: Meds ordered this encounter  Medications   sodium chloride 0.9 % bolus 1,000 mL   DISCONTD: magic mouthwash (nystatin, lidocaine, diphenhydrAMINE, alum & mag hydroxide) suspension    Sig: Swish and spit 5 mLs 4 (four) times daily as needed for mouth pain.    Dispense:  180 mL    Refill:  0   DISCONTD: omeprazole (PRILOSEC) 20 MG capsule    Sig: Take 1 capsule (20 mg total) by mouth daily.    Dispense:  30 capsule    Refill:  0   magic mouthwash (nystatin,  lidocaine, diphenhydrAMINE, alum & mag hydroxide) suspension    Sig: Swish and spit 5 mLs 4 (four) times daily as needed for mouth pain.    Dispense:  180 mL    Refill:  0   omeprazole (PRILOSEC) 20 MG capsule    Sig: Take 1 capsule (20 mg total) by mouth daily.    Dispense:  30 capsule    Refill:  0    -I have reviewed the patients home medicines and have made adjustments as needed   Reevaluation: After the interventions noted above, I reevaluated the patient and found that their symptoms have improved  Co morbidities that complicate the patient evaluation  Past Medical History:  Diagnosis Date   Constipation    Gastric ulcer    Ulcer       Dispostion: Disposition decision including need for hospitalization was considered, and patient discharged from emergency department.    Final Clinical Impression(s) / ED Diagnoses Final diagnoses:  Hyponatremia     This chart was dictated using voice recognition software.  Despite best efforts to proofread,  errors can occur which can change the documentation meaning.    Lonell Grandchild, MD 07/31/23 1345

## 2023-07-31 NOTE — Discharge Instructions (Addendum)
We evaluated you for your nausea and constipation.  Your sodium level was low probably because you are still taking the HCTZ.  Please stop taking this medication.  I think it will help with your nausea and constipation.  If you continue to feel constipated you can take 2 scoops of MiraLAX daily instead of 1.  I have refilled your medicine for your burning mouth pain and your antacid medication.  Please finish the course of antibiotics that was prescribed.  You must finish this for your symptoms to improve.  Please return to the emergency department if you have any worsening symptoms.  Nous vous avons examin pour vos nauses et votre constipation. Votre taux de sodium tait bas probablement parce que vous prenez toujours de l'HCTZ. Veuillez arrter de prendre ce mdicament. Je pense qu'il vous aidera  soulager vos nauses et votre constipation. Si vous continuez  vous sentir constip, vous pouvez prendre 2 mesures de MiraLAX par [employer].  J'ai renouvel votre ordonnance pour vos brlures buccales et votre mdicament antiacide. Veuillez terminer le traitement antibiotique qui vous a t prescrit. Vous devez le terminer pour que vos symptmes s'amliorent.  Steele Sizer retourner au service des urgences si vos symptmes s'aggravent.

## 2023-10-29 ENCOUNTER — Ambulatory Visit (INDEPENDENT_AMBULATORY_CARE_PROVIDER_SITE_OTHER): Payer: Self-pay | Admitting: Primary Care

## 2024-02-14 ENCOUNTER — Emergency Department (HOSPITAL_COMMUNITY): Payer: Self-pay

## 2024-02-14 ENCOUNTER — Encounter (HOSPITAL_COMMUNITY): Payer: Self-pay

## 2024-02-14 ENCOUNTER — Other Ambulatory Visit: Payer: Self-pay

## 2024-02-14 ENCOUNTER — Inpatient Hospital Stay (HOSPITAL_COMMUNITY): Payer: Self-pay

## 2024-02-14 ENCOUNTER — Inpatient Hospital Stay (HOSPITAL_COMMUNITY)
Admission: EM | Admit: 2024-02-14 | Discharge: 2024-02-16 | DRG: 390 | Disposition: A | Discharge status: Home / Self Care | Payer: Self-pay | Attending: Internal Medicine | Admitting: Internal Medicine

## 2024-02-14 DIAGNOSIS — Z91010 Allergy to peanuts: Secondary | ICD-10-CM

## 2024-02-14 DIAGNOSIS — K565 Intestinal adhesions [bands], unspecified as to partial versus complete obstruction: Principal | ICD-10-CM | POA: Diagnosis present

## 2024-02-14 DIAGNOSIS — Z886 Allergy status to analgesic agent status: Secondary | ICD-10-CM

## 2024-02-14 DIAGNOSIS — Z79899 Other long term (current) drug therapy: Secondary | ICD-10-CM

## 2024-02-14 DIAGNOSIS — R7401 Elevation of levels of liver transaminase levels: Secondary | ICD-10-CM

## 2024-02-14 DIAGNOSIS — K56609 Unspecified intestinal obstruction, unspecified as to partial versus complete obstruction: Principal | ICD-10-CM | POA: Diagnosis present

## 2024-02-14 DIAGNOSIS — Z8711 Personal history of peptic ulcer disease: Secondary | ICD-10-CM

## 2024-02-14 DIAGNOSIS — I10 Essential (primary) hypertension: Secondary | ICD-10-CM | POA: Diagnosis present

## 2024-02-14 LAB — COMPREHENSIVE METABOLIC PANEL
ALT: 48 U/L — ABNORMAL HIGH (ref 0–44)
AST: 42 U/L — ABNORMAL HIGH (ref 15–41)
Albumin: 4 g/dL (ref 3.5–5.0)
Alkaline Phosphatase: 80 U/L (ref 38–126)
Anion gap: 13 (ref 5–15)
BUN: 20 mg/dL (ref 8–23)
CO2: 23 mmol/L (ref 22–32)
Calcium: 9.5 mg/dL (ref 8.9–10.3)
Chloride: 101 mmol/L (ref 98–111)
Creatinine, Ser: 1.1 mg/dL (ref 0.61–1.24)
GFR, Estimated: >60 mL/min (ref >60)
Glucose, Bld: 111 mg/dL — ABNORMAL HIGH (ref 70–99)
Potassium: 4 mmol/L (ref 3.5–5.1)
Sodium: 137 mmol/L (ref 135–145)
Total Bilirubin: 0.5 mg/dL (ref 0.0–1.2)
Total Protein: 7.9 g/dL (ref 6.5–8.1)

## 2024-02-14 LAB — CBC WITH DIFFERENTIAL/PLATELET
Abs Immature Granulocytes: 0.03 10*3/uL (ref 0.00–0.07)
Basophils Absolute: 0 10*3/uL (ref 0.0–0.1)
Basophils Relative: 1 %
Eosinophils Absolute: 0.1 10*3/uL (ref 0.0–0.5)
Eosinophils Relative: 1 %
HCT: 50.8 % (ref 39.0–52.0)
Hemoglobin: 17 g/dL (ref 13.0–17.0)
Immature Granulocytes: 1 %
Lymphocytes Relative: 33 %
Lymphs Abs: 2 10*3/uL (ref 0.7–4.0)
MCH: 31.3 pg (ref 26.0–34.0)
MCHC: 33.5 g/dL (ref 30.0–36.0)
MCV: 93.4 fL (ref 80.0–100.0)
Monocytes Absolute: 0.6 10*3/uL (ref 0.1–1.0)
Monocytes Relative: 10 %
Neutro Abs: 3.2 10*3/uL (ref 1.7–7.7)
Neutrophils Relative %: 54 %
Platelets: 209 10*3/uL (ref 150–400)
RBC: 5.44 MIL/uL (ref 4.22–5.81)
RDW: 14.6 % (ref 11.5–15.5)
WBC: 5.9 10*3/uL (ref 4.0–10.5)
nRBC: 0 % (ref 0.0–0.2)

## 2024-02-14 LAB — URINALYSIS, W/ REFLEX TO CULTURE (INFECTION SUSPECTED)
Bacteria, UA: NONE SEEN
Bilirubin Urine: NEGATIVE
Glucose, UA: NEGATIVE mg/dL
Hgb urine dipstick: NEGATIVE
Ketones, ur: NEGATIVE mg/dL
Leukocytes,Ua: NEGATIVE
Nitrite: NEGATIVE
Protein, ur: NEGATIVE mg/dL
Specific Gravity, Urine: 1.006 (ref 1.005–1.030)
pH: 6 (ref 5.0–8.0)

## 2024-02-14 LAB — HEPATITIS PANEL, ACUTE
HCV Ab: NONREACTIVE
Hep A IgM: NONREACTIVE
Hep B C IgM: NONREACTIVE
Hepatitis B Surface Ag: NONREACTIVE

## 2024-02-14 LAB — PROTIME-INR
INR: 1 (ref 0.8–1.2)
Prothrombin Time: 13 s (ref 11.4–15.2)

## 2024-02-14 LAB — ACETAMINOPHEN LEVEL: Acetaminophen (Tylenol), Serum: <10 ug/mL — ABNORMAL LOW (ref 10–30)

## 2024-02-14 LAB — LIPASE, BLOOD: Lipase: 38 U/L (ref 11–51)

## 2024-02-14 LAB — MAGNESIUM: Magnesium: 2 mg/dL (ref 1.7–2.4)

## 2024-02-14 MED ORDER — ONDANSETRON HCL 4 MG/2ML IJ SOLN
4.0000 mg | Freq: Once | INTRAMUSCULAR | Status: AC
  Administered 2024-02-14: 4 mg via INTRAVENOUS
  Filled 2024-02-14: qty 2

## 2024-02-14 MED ORDER — IOHEXOL 350 MG/ML SOLN
75.0000 mL | Freq: Once | INTRAVENOUS | Status: AC | PRN
  Administered 2024-02-14: 75 mL via INTRAVENOUS

## 2024-02-14 MED ORDER — ONDANSETRON HCL 4 MG PO TABS: 4.0000 mg | ORAL_TABLET | Freq: Four times a day (QID) | ORAL | Status: DC | PRN

## 2024-02-14 MED ORDER — ONDANSETRON HCL 4 MG/2ML IJ SOLN
4.0000 mg | Freq: Four times a day (QID) | INTRAMUSCULAR | Status: DC | PRN
  Administered 2024-02-15: 4 mg via INTRAVENOUS
  Filled 2024-02-14: qty 2

## 2024-02-14 MED ORDER — SODIUM CHLORIDE 0.9 % IV BOLUS
500.0000 mL | Freq: Once | INTRAVENOUS | Status: AC
  Administered 2024-02-14: 500 mL via INTRAVENOUS

## 2024-02-14 MED ORDER — MORPHINE SULFATE (PF) 2 MG/ML IV SOLN
2.0000 mg | INTRAVENOUS | Status: DC | PRN
  Administered 2024-02-14 – 2024-02-15 (×3): 2 mg via INTRAVENOUS
  Filled 2024-02-14 (×3): qty 1

## 2024-02-14 MED ORDER — SODIUM CHLORIDE 0.9 % IV SOLN: INTRAVENOUS | Status: AC

## 2024-02-14 MED ORDER — MORPHINE SULFATE (PF) 4 MG/ML IV SOLN
4.0000 mg | Freq: Once | INTRAVENOUS | Status: AC
  Administered 2024-02-14: 4 mg via INTRAVENOUS
  Filled 2024-02-14: qty 1

## 2024-02-14 MED ORDER — METOPROLOL TARTRATE 5 MG/5ML IV SOLN: 5.0000 mg | Freq: Four times a day (QID) | INTRAVENOUS | Status: DC | PRN

## 2024-02-14 NOTE — ED Notes (Signed)
Dr. Artis Flock was asked about NG tube for this patient,  and she stated that patient would need, but typically surgery makes the final decision. NG was placed, and x-ray confirmed by Dr. Artis Flock bedside.

## 2024-02-14 NOTE — H&P (Signed)
History and Physical    PatientJaden Rivers HQI:[id number] DOB: 02/29/1956 DOA: 02/14/2024 DOS: the patient was seen and examined on 02/14/2024 PCP: Grayce Sessions, NP  Patient coming from: Home - lives with son. Ambulates independently  Translation service used for patient in [place], but he didn't understand them. Requested family half way through to translate   Chief Complaint: constipation/abdominal pain   HPI: Caleb Rivers is a 68 y.o. male with medical history significant of PUD, diverticulosis, HTN and SBO with diagnostic lap in 02/25/23 who presented to ED with constipation/abdominal pain.  He states he started to have constipation and abdominal pain early this AM. Pain is peri umibilical and intermittent. Rated as a 6/10 and sharp when he gets it. No radiation. No N/V. Last had a normal BM yesterday. This AM he had tiny balls of poop. No flatus.   He has never had a colonoscopy. He had his surgery for lap in 2/24, but no other surgeries.   He has been feeling good. Denies any fever/chills, vision changes/headaches, chest pain or palpitations, shortness of breath or cough, dysuria or leg swelling.    He does not smoke or drink alcohol.   ER Course:  vitals: afebrile, bp: 175/103, HR: 81, RR: 20 oxygen: 100%RA Pertinent labs: AST: 41 ALT: 48 CT abdomen/pelvis: . Small bowel obstruction with transition point in the right abdomen (image 82 series 6) at an apparent angulated loop, favoring adhesion. Questionable accentuated enhancement along the medial wall of the transition point, underlying small bowel lesion difficult to totally exclude. Surgical consultation is recommended. 2. Small type 1 hiatal hernia. 3. Aortic atherosclerosis. In ED: general surgery consulted. Given zofran and 500 cc IVF bolus. TRH asked to admit.     Review of Systems: As mentioned in the history of present illness. All other systems reviewed and are negative. Past Medical History:  Diagnosis  Date   Constipation    Gastric ulcer    Ulcer    Past Surgical History:  Procedure Laterality Date   LAPAROSCOPY N/A 02/26/2023   Procedure: LAPAROSCOPY DIAGNOSTIC;  Surgeon: Andria Meuse, MD;  Location: [facility] OR;  Service: General;  Laterality: N/A;   LEFT HEART CATHETERIZATION WITH CORONARY ANGIOGRAM N/A 09/25/2013   Procedure: LEFT HEART CATHETERIZATION WITH CORONARY ANGIOGRAM;  Surgeon: Lennette Bihari, MD;  Location[facility] C[facility] LAB;  Service: Cardiovascular;  Laterality: N/A;   Social History:  reports that he has never smoked. He has never used smokeless tobacco. He reports that he does not drink alcohol and does not use drugs.  Allergies  Allergen Reactions   Asa [Aspirin] Other (See Comments)    Current and Hx of GI ulcer - per Pt and friend/translator   Nsaids Other (See Comments)    Current and Hx of GI ulcer per pt and friend/translator   Peanut-Containing Drug Products     Pain.      Family History  Problem Relation Age of Onset   Diabetes Mellitus II Neg Hx    Cancer Neg Hx     Prior to Admission medications   Medication Sig Start Date End Date Taking? Authorizing Provider  acetaminophen (TYLENOL) 500 MG tablet Take 1 tablet (500 mg total) by mouth every 6 (six) hours as needed. Patient taking differently: Take 1,000 mg by mouth daily as needed for moderate pain. 06/19/23   Sabas Sous, MD  amLODipine (NORVASC) 5 MG tablet Take 2 tablets (10 mg total) by mouth daily. 07/08/23   Grayce Sessions,  NP  amoxicillin (AMOXIL) 500 MG capsule Take 2 capsules (1,000 mg total) by mouth 2 (two) times daily. 07/21/23   Hoy Register, MD  clarithromycin (BIAXIN) 500 MG tablet Take 1 tablet (500 mg total) by mouth 2 (two) times daily. 07/21/23   Hoy Register, MD  magic mouthwash (nystatin, lidocaine, diphenhydrAMINE, alum & mag hydroxide) suspension Swish and spit 5 mLs 4 (four) times daily as needed for mouth pain. 07/31/23   Lonell Grandchild, MD  omeprazole (PRILOSEC)  20 MG capsule Take 1 capsule (20 mg total) by mouth daily. 07/31/23   Lonell Grandchild, MD  ondansetron (ZOFRAN) 4 MG tablet Take 1 tablet (4 mg total) by mouth every 6 (six) hours. 07/14/23   Lenard Simmer, PA-C    Physical Exam: Vitals:   02/14/24 0745 02/14/24 0900 02/14/24 0915 02/14/24 1121  BP: (!) 156/94 (!) 155/94 (!) 151/95 (!) 167/102  Pulse: 74 71 70 70  Resp:  20  18  Temp:    97.8 F (36.6 C)  TempSrc:    Oral  SpO2: 100% 100% 100% 100%  Weight:      Height:       General:  Appears calm and comfortable and is in NAD Eyes:  PERRL, EOMI, normal lids, iris ENT:  grossly normal hearing, lips & tongue, mmm; appropriate dentition Neck:  no LAD, masses or thyromegaly; no carotid bruits Cardiovascular:  RRR, no m/r/g. No LE edema.  Respiratory:   CTA bilaterally with no wheezes/rales/rhonchi.  Normal respiratory effort. Abdomen:  soft, distended. BS hypoactive, high pitched. No TTP  Back:   normal alignment, no CVAT Skin:  no rash or induration seen on limited exam Musculoskeletal:  grossly normal tone BUE/BLE, good ROM, no bony abnormality Lower extremity:  trace LE edema.  Limited foot exam with no ulcerations.  2+ distal pulses. Psychiatric:  grossly normal mood and affect, speech fluent and appropriate, AOx3 Neurologic:  CN 2-12 grossly intact, moves all extremities in coordinated fashion, sensation intact   Radiological Exams on Admission: Independently reviewed - see discussion in A/P where applicable  CT ABDOMEN PELVIS W CONTRAST Result Date: 02/14/2024 CLINICAL DATA:  Acute abdominal pain EXAM: CT ABDOMEN AND PELVIS WITH CONTRAST TECHNIQUE: Multidetector CT imaging of the abdomen and pelvis was performed using the standard protocol following bolus administration of intravenous contrast. RADIATION DOSE REDUCTION: This exam was performed according to the departmental dose-optimization program which includes automated exposure control, adjustment of the mA and/or kV  according to patient size and/or use of iterative reconstruction technique. CONTRAST:  75mL OMNIPAQUE IOHEXOL 350 MG/ML SOLN COMPARISON:  07/18/2023 FINDINGS: Lower chest: Small type 1 hiatal hernia. Hepatobiliary: Unremarkable Pancreas: Unremarkable Spleen: Unremarkable Adrenals/Urinary Tract: No significant findings. Stomach/Bowel: Dilated loops of small bowel up to 3.8 cm in diameter are observed extending to a transition point in the right abdomen at an apparent angulated loop as shown on image 82 series 6. Questionable accentuated enhancement along the medial wall of the transition point, underlying small bowel lesion difficult to totally exclude. The small bowel loops distal to this transition are of normal caliber. Vascular/Lymphatic: Atherosclerosis is present, including aortoiliac atherosclerotic disease. Reproductive: Unremarkable Other: No supplemental non-categorized findings. Musculoskeletal: Unremarkable IMPRESSION: 1. Small bowel obstruction with transition point in the right abdomen (image 82 series 6) at an apparent angulated loop, favoring adhesion. Questionable accentuated enhancement along the medial wall of the transition point, underlying small bowel lesion difficult to totally exclude. Surgical consultation is recommended. 2. Small type 1 hiatal  hernia. 3. Aortic atherosclerosis. Aortic Atherosclerosis (ICD10-I70.0). Electronically Signed   By: Gaylyn Rong M.D.   On: 02/14/2024 10:41    Labs on Admission: I have personally reviewed the available labs and imaging studies at the time of the admission.  Pertinent labs:   AST: 41 ALT: 48  Assessment and Plan: Principal Problem:   Small bowel obstruction (HCC) Active Problems:   Transaminitis   Essential hypertension    Assessment and Plan: * Small bowel obstruction (HCC) 68 year old male with hx of SBO with diagnostic lap in 01/2023 who presented to ED with complaints of intermittent abdominal pain and constipation found to  have SBO with transition point in right abdomen at an apparent angulated loop, favoring adhesion.  -Admit to MedSurg and make n.p.o. -no clinical findings for emergent surgery at this time  -no hx of abdominal surgeries except diagnostic lap in 01/2023 for SBO -concern for adhesions, but underlying small bowel lesion difficult to exclude  -pain free at this time  -place NG tube-abdominal xray pending  -general surgery consulted  -Gentle IV fluid hydration -Pain medication -encourage ambulation -Monitor electrolytes   Transaminitis Hx of transaminitis since 07/2023  Unremarkable findings on CT abdomen/pelvis today  Takes tylenol daily, check level Check hepatitis panel Trend   Essential hypertension Has history of elevated blood pressure in his chart and px for amlodipine, but does not take medication Quite elevated, will likely need to start oral therapy Will do IV while NPO  Start oral anti-HTN when tolerating PO     Advance Care Planning:   Code Status: Full Code   Consults: general surgery   DVT Prophylaxis: SCDS/TED hose   Family Communication: updated brother by phone   Severity of Illness: The appropriate patient status for this patient is INPATIENT. Inpatient status is judged to be reasonable and necessary in order to provide the required intensity of service to ensure the patient's safety. The patient's presenting symptoms, physical exam findings, and initial radiographic and laboratory data in the context of their chronic comorbidities is felt to place them at high risk for further clinical deterioration. Furthermore, it is not anticipated that the patient will be medically stable for discharge from the hospital within 2 midnights of admission.   * I certify that at the point of admission it is my clinical judgment that the patient will require inpatient hospital care spanning beyond 2 midnights from the point of admission due to high intensity of service, high risk for  further deterioration and high frequency of surveillance required.*  Author: Orland Mustard, MD 02/14/2024 12:22 PM  For on call review [email or web address].

## 2024-02-14 NOTE — Assessment & Plan Note (Signed)
Has history of elevated blood pressure in his chart and px for amlodipine, but does not take medication Quite elevated, will likely need to start oral therapy Will do IV while NPO  Start oral anti-HTN when tolerating PO

## 2024-02-14 NOTE — ED Notes (Signed)
 Patient transported to CT

## 2024-02-14 NOTE — ED Provider Notes (Signed)
[place] EMERGENCY DEPARTMENT AT [facility] Provider Note   CSN: [id number] Arrival date & time: 02/14/24  4098     History  Chief Complaint  Patient presents with   Abdominal Pain    Caleb Rivers is a 68 y.o. male.  68 year old male with prior medical history as detailed below presents for evaluation.  Patient arrives from home with complaint of diffuse abdominal pain and constipation.  He reports that his last bowel movement was yesterday.  Patient reports that he feels like he needs to go today but cannot.  He reports chronic issues with constipation.  He denies nausea or vomiting.  He denies fever.  He denies chest pain or shortness of breath.  He reports regular use of a laxative powder.  He denies use of enemas or other treatments for constipation.  [place] interpreter used for history.  Chart review reveals evidence of diagnostic laparoscopy performed in February 2024 for evaluation of high-grade small bowel obstruction.  The history is provided by the patient and medical records.       Home Medications Prior to Admission medications   Medication Sig Start Date End Date Taking? Authorizing Provider  acetaminophen (TYLENOL) 500 MG tablet Take 1 tablet (500 mg total) by mouth every 6 (six) hours as needed. Patient taking differently: Take 1,000 mg by mouth daily as needed for moderate pain. 06/19/23   Sabas Sous, MD  amLODipine (NORVASC) 5 MG tablet Take 2 tablets (10 mg total) by mouth daily. 07/08/23   Grayce Sessions, NP  amoxicillin (AMOXIL) 500 MG capsule Take 2 capsules (1,000 mg total) by mouth 2 (two) times daily. 07/21/23   Hoy Register, MD  clarithromycin (BIAXIN) 500 MG tablet Take 1 tablet (500 mg total) by mouth 2 (two) times daily. 07/21/23   Hoy Register, MD  magic mouthwash (nystatin, lidocaine, diphenhydrAMINE, alum & mag hydroxide) suspension Swish and spit 5 mLs 4 (four) times daily as needed for mouth pain. 07/31/23   Lonell Grandchild, MD  omeprazole (PRILOSEC) 20 MG capsule Take 1 capsule (20 mg total) by mouth daily. 07/31/23   Lonell Grandchild, MD  ondansetron (ZOFRAN) 4 MG tablet Take 1 tablet (4 mg total) by mouth every 6 (six) hours. 07/14/23   Melton Alar R, PA-C      Allergies    Asa [aspirin], Nsaids, and Peanut-containing drug products    Review of Systems   Review of Systems  All other systems reviewed and are negative.   Physical Exam Updated Vital Signs BP (!) 175/103   Pulse 81   SpO2 100%  Physical Exam Vitals and nursing note reviewed.  Constitutional:      General: He is not in acute distress.    Appearance: He is well-developed.  HENT:     Head: Normocephalic and atraumatic.  Eyes:     Conjunctiva/sclera: Conjunctivae normal.  Cardiovascular:     Rate and Rhythm: Normal rate and regular rhythm.     Heart sounds: No murmur heard. Pulmonary:     Effort: Pulmonary effort is normal. No respiratory distress.     Breath sounds: Normal breath sounds.  Abdominal:     Palpations: Abdomen is soft.     Tenderness: There is no abdominal tenderness.  Musculoskeletal:        General: No swelling.     Cervical back: Neck supple.  Skin:    General: Skin is warm and dry.     Capillary Refill: Capillary refill takes less  than 2 seconds.  Neurological:     Mental Status: He is alert.  Psychiatric:        Mood and Affect: Mood normal.     ED Results / Procedures / Treatments   Labs (all labs ordered are listed, but only abnormal results are displayed) Labs Reviewed  COMPREHENSIVE METABOLIC PANEL - Abnormal; Notable for the following components:      Result Value   Glucose, Bld 111 (*)    AST 42 (*)    ALT 48 (*)    All other components within normal limits  URINALYSIS, W/ REFLEX TO CULTURE (INFECTION SUSPECTED) - Abnormal; Notable for the following components:   Color, Urine STRAW (*)    All other components within normal limits  CBC WITH DIFFERENTIAL/PLATELET  LIPASE, BLOOD     EKG None  Radiology No results found.  Procedures Procedures    Medications Ordered in ED Medications  sodium chloride 0.9 % bolus 500 mL (has no administration in time range)    ED Course/ Medical Decision Making/ A&P                                 Medical Decision Making Amount and/or Complexity of Data Reviewed Labs: ordered. Radiology: ordered.  Risk Prescription drug management. Decision regarding hospitalization.    Medical Screen Complete  This patient presented to the ED with complaint of constipation, abdominal pain.  This complaint involves an extensive number of treatment options. The initial differential diagnosis includes, but is not limited to, bowel obstruction, constipation, intra-abdominal pathology such as infection, abscess, etc.  This presentation is: Acute, Chronic, Self-Limited, Previously Undiagnosed, Uncertain Prognosis, Complicated, Systemic Symptoms, and Threat to Life/Bodily Function  Patient reports constipation, diffuse abdominal pain, difficulty having a bowel movement.  CT imaging suggest small bowel obstruction.  Patient with history of same.  Patient will require admission for further workup and treatment.  Hospitalist service made aware of case.  General surgery made aware of need for consultation.  Co morbidities that complicated the patient's evaluation  See HPI   Additional history obtained:  External records from outside sources obtained and reviewed including prior ED visits and prior Inpatient records.    Lab Tests:  I ordered and personally interpreted labs.  The pertinent results include: CBC, CMP, lipase, UA   Imaging Studies ordered:  I ordered imaging studies including CT abdomen pelvis I independently visualized and interpreted obtained imaging which showed likely small bowel obstruction with transition point I agree with the radiologist interpretation.   Problem List / ED Course:  Abdominal  pain, small bowel obstruction   Reevaluation:  After the interventions noted above, I reevaluated the patient and found that they have: stayed the same   Disposition:  After consideration of the diagnostic results and the patients response to treatment, I feel that the patent would benefit from admission.          Final Clinical Impression(s) / ED Diagnoses Final diagnoses:  Small bowel obstruction [facility])    Rx / DC Orders ED Discharge Orders     None         Wynetta Fines, MD 02/14/24 1110

## 2024-02-14 NOTE — Assessment & Plan Note (Addendum)
68 year old male with hx of SBO with diagnostic lap in 01/2023 who presented to ED with complaints of intermittent abdominal pain and constipation found to have SBO with transition point in right abdomen at an apparent angulated loop, favoring adhesion. Symptoms started improving, started getting bowel movement and no nausea or vomiting. General surgery is on board-ordered imaging for SBO protocol and if contrast is passing through they will start him on diet -Monitor electrolytes -Follow-up general surgery recommendations

## 2024-02-14 NOTE — Assessment & Plan Note (Addendum)
Improved.  Hepatitis panel negative.  Tylenol levels negative Unremarkable findings on CT abdomen/pelvis today

## 2024-02-14 NOTE — ED Triage Notes (Signed)
Pt c/o generalized abdominal pain and nausea started last night. Pt last BM was yesterday. Pt states today is when he felt it was hard to have a BM.

## 2024-02-15 ENCOUNTER — Inpatient Hospital Stay (HOSPITAL_COMMUNITY): Payer: Self-pay

## 2024-02-15 DIAGNOSIS — R7401 Elevation of levels of liver transaminase levels: Secondary | ICD-10-CM

## 2024-02-15 DIAGNOSIS — I10 Essential (primary) hypertension: Secondary | ICD-10-CM

## 2024-02-15 LAB — CBC
HCT: 48 % (ref 39.0–52.0)
Hemoglobin: 16.3 g/dL (ref 13.0–17.0)
MCH: 31.7 pg (ref 26.0–34.0)
MCHC: 34 g/dL (ref 30.0–36.0)
MCV: 93.4 fL (ref 80.0–100.0)
Platelets: 229 10*3/uL (ref 150–400)
RBC: 5.14 MIL/uL (ref 4.22–5.81)
RDW: 14.6 % (ref 11.5–15.5)
WBC: 7.1 10*3/uL (ref 4.0–10.5)
nRBC: 0 % (ref 0.0–0.2)

## 2024-02-15 LAB — COMPREHENSIVE METABOLIC PANEL
ALT: 36 U/L (ref 0–44)
AST: 33 U/L (ref 15–41)
Albumin: 3.4 g/dL — ABNORMAL LOW (ref 3.5–5.0)
Alkaline Phosphatase: 60 U/L (ref 38–126)
Anion gap: 8 (ref 5–15)
BUN: 14 mg/dL (ref 8–23)
CO2: 24 mmol/L (ref 22–32)
Calcium: 9.1 mg/dL (ref 8.9–10.3)
Chloride: 105 mmol/L (ref 98–111)
Creatinine, Ser: 1.07 mg/dL (ref 0.61–1.24)
GFR, Estimated: >60 mL/min (ref >60)
Glucose, Bld: 105 mg/dL — ABNORMAL HIGH (ref 70–99)
Potassium: 4 mmol/L (ref 3.5–5.1)
Sodium: 137 mmol/L (ref 135–145)
Total Bilirubin: 0.8 mg/dL (ref 0.0–1.2)
Total Protein: 7 g/dL (ref 6.5–8.1)

## 2024-02-15 MED ORDER — AMLODIPINE BESYLATE 5 MG PO TABS
5.0000 mg | ORAL_TABLET | Freq: Every day | ORAL | Status: DC
  Administered 2024-02-15 – 2024-02-16 (×2): 5 mg via ORAL
  Filled 2024-02-15 (×2): qty 1

## 2024-02-15 MED ORDER — DIATRIZOATE MEGLUMINE & SODIUM 66-10 % PO SOLN
90.0000 mL | Freq: Once | ORAL | Status: AC
  Administered 2024-02-15: 90 mL via NASOGASTRIC
  Filled 2024-02-15: qty 90

## 2024-02-15 NOTE — TOC CM/SW Note (Signed)
Transition of Care [facility]) - Inpatient Brief Assessment   Patient Details  Name: Caleb Rivers MRN: [id number] Date of Birth: 06/10/1956  Transition of Care [facility]) CM/SW Contact:    Harriet Masson, RN Phone Number: 02/15/2024, 12:26 PM   Clinical Narrative:  Patient admitted for SBO. This RNCM emailed FC for potential medicaid.  TOC will continue to follow for [facility] letter needs.  Transition of Care Asessment: Insurance and Status: Insurance coverage has been reviewed (potential for medicaid) Patient has primary care physician: Yes Home environment has been reviewed: safe to discharge home Prior level of function:: indpendentnt Prior/Current Home Services: No current home services Social Drivers of Health Review: SDOH reviewed no interventions necessary Readmission risk has been reviewed: Yes Transition of care needs: transition of care needs identified, TOC will continue to follow

## 2024-02-15 NOTE — Plan of Care (Signed)

## 2024-02-15 NOTE — Hospital Course (Addendum)
Taken from H&P.  Caleb Rivers is a 68 y.o. male with medical history significant of PUD, diverticulosis, HTN and SBO with diagnostic lap in 02/25/23 who presented to ED with constipation/abdominal pain.  No nausea or vomiting, last bowel movement was on 2/15.  CT abdomen and pelvis with concern of SBO, transition point in the right abdomen, favoring adhesion.  Also concern of a small bowel lesion.  General surgery was consulted.  NG tube was placed with SBO protocol.  2/17: Vital and labs stable, patient started having bowel movement with improvement of abdominal pain, no nausea or vomiting, NG tube was disconnected.  Patient is going for abdominal imaging per his SBO protocol and if contrast is going through then surgery will likely start on diet.  2/18: Remained hemodynamically stable.  Able to tolerate soft diet.  Abdominal imaging with contrast in the colon, remained mildly dilated.  General surgery cleared him for discharge.  Patient was instructed to avoid constipation and slowly advance his diet.  No nausea or vomiting.  He will continue on current medications and follow-up with his providers closely for further management.

## 2024-02-15 NOTE — Consult Note (Signed)
Reason for Consult/Chief Complaint: SBO Consultant: Artis Flock, MD  Caleb Rivers is an 68 y.o. male.   HPI: 65M with one day history of constipation and abdominal pain. Per documentation, he needs a [place] interpreter, but when on was used, he did not understand them. He does speak a limited amount of English and was able to share that he had a normal BM 2/15 and began having symptoms of pain 2/16 with inability to have a BM despite efforts. He had a negative diagnostic lap ~1y ago for SBO and question of SB neoplasm.   Past Medical History:  Diagnosis Date   Constipation    Gastric ulcer    Ulcer     Past Surgical History:  Procedure Laterality Date   LAPAROSCOPY N/A 02/26/2023   Procedure: LAPAROSCOPY DIAGNOSTIC;  Surgeon: Andria Meuse, MD;  Location: [facility] OR;  Service: General;  Laterality: N/A;   LEFT HEART CATHETERIZATION WITH CORONARY ANGIOGRAM N/A 09/25/2013   Procedure: LEFT HEART CATHETERIZATION WITH CORONARY ANGIOGRAM;  Surgeon: Lennette Bihari, MD;  Location: [facility] CATH LAB;  Service: Cardiovascular;  Laterality: N/A;    Family History  Problem Relation Age of Onset   Diabetes Mellitus II Neg Hx    Cancer Neg Hx     Social History:  reports that he has never smoked. He has never used smokeless tobacco. He reports that he does not drink alcohol and does not use drugs.  Allergies:  Allergies  Allergen Reactions   Asa [Aspirin] Other (See Comments)    Current and Hx of GI ulcer - per Pt and friend/translator   Nsaids Other (See Comments)    Current and Hx of GI ulcer per pt and friend/translator   Peanut-Containing Drug Products     Pain.      Medications: I have reviewed the patient's current medications.  Results for orders placed or performed during the hospital encounter of 02/14/24 (from the past 48 hours)  Comprehensive metabolic panel     Status: Abnormal   Collection Time: 02/14/24  7:41 AM  Result Value Ref Range   Sodium 137 135 - 145 mmol/L    Potassium 4.0 3.5 - 5.1 mmol/L   Chloride 101 98 - 111 mmol/L   CO2 23 22 - 32 mmol/L   Glucose, Bld 111 (H) 70 - 99 mg/dL    Comment: Glucose reference range applies only to samples taken after fasting for at least 8 hours.   BUN 20 8 - 23 mg/dL   Creatinine, Ser 1.61 0.61 - 1.24 mg/dL   Calcium 9.5 8.9 - 09.6 mg/dL   Total Protein 7.9 6.5 - 8.1 g/dL   Albumin 4.0 3.5 - 5.0 g/dL   AST 42 (H) 15 - 41 U/L   ALT 48 (H) 0 - 44 U/L   Alkaline Phosphatase 80 38 - 126 U/L   Total Bilirubin 0.5 0.0 - 1.2 mg/dL   GFR, Estimated >04 >54 mL/min    Comment: (NOTE) Calculated using the CKD-EPI Creatinine Equation (2021)    Anion gap 13 5 - 15    Comment: Performed at [facility], [address], [place], [place] [address]  CBC with Differential     Status: None   Collection Time: 02/14/24  7:41 AM  Result Value Ref Range   WBC 5.9 4.0 - 10.5 K/uL   RBC 5.44 4.22 - 5.81 MIL/uL   Hemoglobin 17.0 13.0 - 17.0 g/dL   HCT 91.4 78.2 - 95.6 %  MCV 93.4 80.0 - 100.0 fL   MCH 31.3 26.0 - 34.0 pg   MCHC 33.5 30.0 - 36.0 g/dL   RDW 82.9 56.2 - 13.0 %   Platelets 209 150 - 400 K/uL   nRBC 0.0 0.0 - 0.2 %   Neutrophils Relative % 54 %   Neutro Abs 3.2 1.7 - 7.7 K/uL   Lymphocytes Relative 33 %   Lymphs Abs 2.0 0.7 - 4.0 K/uL   Monocytes Relative 10 %   Monocytes Absolute 0.6 0.1 - 1.0 K/uL   Eosinophils Relative 1 %   Eosinophils Absolute 0.1 0.0 - 0.5 K/uL   Basophils Relative 1 %   Basophils Absolute 0.0 0.0 - 0.1 K/uL   Immature Granulocytes 1 %   Abs Immature Granulocytes 0.03 0.00 - 0.07 K/uL    Comment: Performed at [facility], [address] [place], [place] [address]  Lipase, blood     Status: None   Collection Time: 02/14/24  7:41 AM  Result Value Ref Range   Lipase 38 11 - 51 U/L    Comment: Performed at [facility], [address], [place], [place] [address]  Urinalysis, w/ Reflex to Culture (Infection Suspected) -Urine, Clean Catch     Status: Abnormal    Collection Time: 02/14/24 10:10 AM  Result Value Ref Range   Specimen Source URINE, CLEAN CATCH    Color, Urine STRAW (A) YELLOW   APPearance CLEAR CLEAR   Specific Gravity, Urine 1.006 1.005 - 1.030   pH 6.0 5.0 - 8.0   Glucose, UA NEGATIVE NEGATIVE mg/dL   Hgb urine dipstick NEGATIVE NEGATIVE   Bilirubin Urine NEGATIVE NEGATIVE   Ketones, ur NEGATIVE NEGATIVE mg/dL   Protein, ur NEGATIVE NEGATIVE mg/dL   Nitrite NEGATIVE NEGATIVE   Leukocytes,Ua NEGATIVE NEGATIVE   RBC / HPF 0-5 0 - 5 RBC/hpf   WBC, UA 0-5 0 - 5 WBC/hpf    Comment:        Reflex urine culture not performed if WBC <=10, OR if Squamous epithelial cells >5. If Squamous epithelial cells >5 suggest recollection.    Bacteria, UA NONE SEEN NONE SEEN   Squamous Epithelial / HPF 0-5 0 - 5 /HPF    Comment: Performed at [facility], [address] [place], [place] [address]  Hepatitis panel, acute     Status: None   Collection Time: 02/14/24  1:59 PM  Result Value Ref Range   Hepatitis B Surface Ag NON REACTIVE NON REACTIVE   HCV Ab NON REACTIVE NON REACTIVE    Comment: (NOTE) Nonreactive HCV antibody screen is consistent with no HCV infections,  unless recent infection is suspected or other evidence exists to indicate HCV infection.     Hep A IgM NON REACTIVE NON REACTIVE   Hep B C IgM NON REACTIVE NON REACTIVE    Comment: Performed at [facility], [address] [place], [place] [address]  Protime-INR     Status: None   Collection Time: 02/14/24  1:59 PM  Result Value Ref Range   Prothrombin Time 13.0 11.4 - 15.2 seconds   INR 1.0 0.8 - 1.2    Comment: (NOTE) INR goal varies based on device and disease states. Performed at [facility], [address] [place], [place] [address]   Acetaminophen level     Status: Abnormal   Collection Time: 02/14/24  1:59 PM  Result Value Ref Range   Acetaminophen (Tylenol), Serum <  10 (L) 10 - 30 ug/mL    Comment: (NOTE) Therapeutic concentrations  vary significantly. A range of 10-30 ug/mL  may be an effective concentration for many patients. However, some  are best treated at concentrations outside of this range. Acetaminophen concentrations >150 ug/mL at 4 hours after ingestion  and >50 ug/mL at 12 hours after ingestion are often associated with  toxic reactions.  Performed at [facility], [address] [place], [place] [address]   Magnesium     Status: None   Collection Time: 02/14/24  1:59 PM  Result Value Ref Range   Magnesium 2.0 1.7 - 2.4 mg/dL    Comment: Performed at [facility] Lab, [address], [place], [place] [address]  Comprehensive metabolic panel     Status: Abnormal   Collection Time: 02/15/24  4:32 AM  Result Value Ref Range   Sodium 137 135 - 145 mmol/L   Potassium 4.0 3.5 - 5.1 mmol/L   Chloride 105 98 - 111 mmol/L   CO2 24 22 - 32 mmol/L   Glucose, Bld 105 (H) 70 - 99 mg/dL    Comment: Glucose reference range applies only to samples taken after fasting for at least 8 hours.   BUN 14 8 - 23 mg/dL   Creatinine, Ser 6.96 0.61 - 1.24 mg/dL   Calcium 9.1 8.9 - 29.5 mg/dL   Total Protein 7.0 6.5 - 8.1 g/dL   Albumin 3.4 (L) 3.5 - 5.0 g/dL   AST 33 15 - 41 U/L   ALT 36 0 - 44 U/L   Alkaline Phosphatase 60 38 - 126 U/L   Total Bilirubin 0.8 0.0 - 1.2 mg/dL   GFR, Estimated >28 >41 mL/min    Comment: (NOTE) Calculated using the CKD-EPI Creatinine Equation (2021)    Anion gap 8 5 - 15    Comment: Performed at [facility], [address], [place], [place] [address]  CBC     Status: None   Collection Time: 02/15/24  4:32 AM  Result Value Ref Range   WBC 7.1 4.0 - 10.5 K/uL   RBC 5.14 4.22 - 5.81 MIL/uL   Hemoglobin 16.3 13.0 - 17.0 g/dL   HCT 10.2 72.5 - 36.6 %   MCV 93.4 80.0 - 100.0 fL   MCH 31.7 26.0 - 34.0 pg   MCHC 34.0 30.0 - 36.0 g/dL   RDW 44.0 34.7 - 42.5 %   Platelets 229 150 - 400 K/uL   nRBC 0.0 0.0 - 0.2 %    Comment: Performed at [facility] Lab, [address] [place], [place] [address]    DG Abd Portable 1 View Addendum Date: 02/14/2024 ADDENDUM REPORT: 02/14/2024 13:48 ADDENDUM: Please note, the nasogastric tube side hole is overlying the fundal region and tip overlying the proximal body region. Tube can be further advanced by 2-4 inches 2 put the tip and side hole in the midbody region. Electronically Signed   By: Jules Schick M.D.   On: 02/14/2024 13:48   Result Date: 02/14/2024 CLINICAL DATA:  Nasogastric tube placement. EXAM: PORTABLE ABDOMEN - 1 VIEW COMPARISON:  02/24/2023. FINDINGS: The bowel gas pattern is non-obstructive. No evidence of pneumoperitoneum. No acute osseous abnormalities. The soft tissues are within normal limits. Surgical changes, devices, tubes and lines: Nasogastric tube is seen with its tip and side hole overlying the left upper quadrant, overlying the proximal stomach region. IMPRESSION: *Nonobstructive bowel gas pattern. Nasogastric tube is seen with its  tip and side hole overlying the left upper quadrant, overlying the proximal stomach region. Electronically Signed: By: Jules Schick M.D. On: 02/14/2024 12:49   CT ABDOMEN PELVIS W CONTRAST Result Date: 02/14/2024 CLINICAL DATA:  Acute abdominal pain EXAM: CT ABDOMEN AND PELVIS WITH CONTRAST TECHNIQUE: Multidetector CT imaging of the abdomen and pelvis was performed using the standard protocol following bolus administration of intravenous contrast. RADIATION DOSE REDUCTION: This exam was performed according to the departmental dose-optimization program which includes automated exposure control, adjustment of the mA and/or kV according to patient size and/or use of iterative reconstruction technique. CONTRAST:  75mL OMNIPAQUE IOHEXOL 350 MG/ML SOLN COMPARISON:  07/18/2023 FINDINGS: Lower chest: Small type 1 hiatal hernia. Hepatobiliary: Unremarkable Pancreas: Unremarkable Spleen: Unremarkable Adrenals/Urinary Tract: No significant findings. Stomach/Bowel: Dilated loops of small  bowel up to 3.8 cm in diameter are observed extending to a transition point in the right abdomen at an apparent angulated loop as shown on image 82 series 6. Questionable accentuated enhancement along the medial wall of the transition point, underlying small bowel lesion difficult to totally exclude. The small bowel loops distal to this transition are of normal caliber. Vascular/Lymphatic: Atherosclerosis is present, including aortoiliac atherosclerotic disease. Reproductive: Unremarkable Other: No supplemental non-categorized findings. Musculoskeletal: Unremarkable IMPRESSION: 1. Small bowel obstruction with transition point in the right abdomen (image 82 series 6) at an apparent angulated loop, favoring adhesion. Questionable accentuated enhancement along the medial wall of the transition point, underlying small bowel lesion difficult to totally exclude. Surgical consultation is recommended. 2. Small type 1 hiatal hernia. 3. Aortic atherosclerosis. Aortic Atherosclerosis (ICD10-I70.0). Electronically Signed   By: Gaylyn Rong M.D.   On: 02/14/2024 10:41    ROS 10 point review of systems is negative except as listed above in HPI.   Physical Exam Blood pressure (!) 147/93, pulse 73, temperature 98.1 F (36.7 C), temperature source Oral, resp. rate 18, height 6\' 5"  (1.956 m), weight 99.8 kg, SpO2 100%. Constitutional: well-developed, well-nourished HEENT: pupils equal, round, reactive to light, 2mm b/l, moist conjunctiva, external inspection of ears and nose normal, hearing intact Oropharynx: normal oropharyngeal mucosa, normal dentition Neck: no thyromegaly, trachea midline, no midline cervical tenderness to palpation Chest: breath sounds equal bilaterally, normal respiratory effort, no midline or lateral chest wall tenderness to palpation/deformity Abdomen: soft, NT, no bruising, no hepatosplenomegaly Skin: warm, dry, no rashes Psych: normal memory, normal mood/affect      Assessment/Plan: SBO - NGT in place, SBO protocol.  FEN - NPO except sips/chips DVT - SCDs, LMWH Dispo - med-surg    Diamantina Monks, MD General and Trauma Surgery [facility]

## 2024-02-15 NOTE — Progress Notes (Signed)
  Progress Note   PatientCheron Rivers HQI:[id number] DOB: 05/02/1956 DOA: 02/14/2024     1 DOS: the patient was seen and examined on 02/15/2024   Brief hospital course: Taken from H&P.  Sheriff Rodenberg is a 68 y.o. male with medical history significant of PUD, diverticulosis, HTN and SBO with diagnostic lap in 02/25/23 who presented to ED with constipation/abdominal pain.  No nausea or vomiting, last bowel movement was on 2/15.  CT abdomen and pelvis with concern of SBO, transition point in the right abdomen, favoring adhesion.  Also concern of a small bowel lesion.  General surgery was consulted.  NG tube was placed with SBO protocol.  2/17: Vital and labs stable, patient started having bowel movement with improvement of abdominal pain, no nausea or vomiting, NG tube was disconnected.  Patient is going for abdominal imaging per his SBO protocol and if contrast is going through then surgery will likely start on diet.      Assessment and Plan: * Small bowel obstruction (HCC) 68 year old male with hx of SBO with diagnostic lap in 01/2023 who presented to ED with complaints of intermittent abdominal pain and constipation found to have SBO with transition point in right abdomen at an apparent angulated loop, favoring adhesion. Symptoms started improving, started getting bowel movement and no nausea or vomiting. General surgery is on board-ordered imaging for SBO protocol and if contrast is passing through they will start him on diet -Monitor electrolytes -Follow-up general surgery recommendations   Transaminitis Improved.  Hepatitis panel negative.  Tylenol levels negative Unremarkable findings on CT abdomen/pelvis today    Essential hypertension Has history of elevated blood pressure in his chart and px for amlodipine, but does not take medication -Starting on amlodipine 5 mg -As needed metoprolol IV     Subjective: Patient was seen and examined today.  No more nausea or  vomiting, started passing flatus and had a bowel movement.  Physical Exam: Vitals:   02/14/24 2048 02/15/24 0007 02/15/24 0510 02/15/24 0752  BP: (!) 155/105 136/84 (!) 147/93 (!) 142/85  Pulse: 71 72 73 71  Resp: 18 18 18 18   Temp: 98.1 F (36.7 C) 97.8 F (36.6 C) 98.1 F (36.7 C) (!) 97.5 F (36.4 C)  TempSrc: Oral Oral Oral Oral  SpO2: 98% 96% 100% 100%  Weight:      Height:       General.  Well-developed gentleman, in no acute distress. Pulmonary.  Lungs clear bilaterally, normal respiratory effort. CV.  Regular rate and rhythm, no JVD, rub or murmur. Abdomen.  Soft, nontender, nondistended, BS positive. CNS.  Alert and oriented .  No focal neurologic deficit. Extremities.  No edema, no cyanosis, pulses intact and symmetrical. Psychiatry.  Judgment and insight appears normal.   Data Reviewed: Prior data reviewed  Family Communication: Discussed with patient  Disposition: Status is: Inpatient Remains inpatient appropriate because: Severity of illness  Planned Discharge Destination: Home  Time spent: 45 minutes  This record has been created using Conservation officer, historic buildings. Errors have been sought and corrected,but may not always be located. Such creation errors do not reflect on the standard of care.   Author: Arnetha Courser, MD 02/15/2024 2:23 PM  For on call review [email or web address].

## 2024-02-16 ENCOUNTER — Other Ambulatory Visit (HOSPITAL_COMMUNITY): Payer: Self-pay

## 2024-02-16 ENCOUNTER — Inpatient Hospital Stay (HOSPITAL_COMMUNITY): Payer: Self-pay

## 2024-02-16 MED ORDER — ONDANSETRON HCL 4 MG PO TABS
4.0000 mg | ORAL_TABLET | Freq: Four times a day (QID) | ORAL | 0 refills | Status: DC | PRN
  Filled 2024-02-16: qty 20, 5d supply, fill #0

## 2024-02-16 MED ORDER — AMLODIPINE BESYLATE 5 MG PO TABS
5.0000 mg | ORAL_TABLET | Freq: Every day | ORAL | 1 refills | Status: DC
  Filled 2024-02-16 – 2024-05-16 (×2): qty 90, 90d supply, fill #0

## 2024-02-16 NOTE — Plan of Care (Signed)

## 2024-02-16 NOTE — Discharge Summary (Signed)
Physician Discharge Summary   Patient: Caleb Rivers MRN: [id number] DOB: Mar 12, 1956  Admit date:     02/14/2024  Discharge date: 02/16/24  Discharge Physician: Arnetha Courser   PCP: Grayce Sessions, NP   Recommendations at discharge:  Please obtain CBC and BMP on follow-up Follow-up with primary care provider within a week  Discharge Diagnoses: Principal Problem:   Small bowel obstruction [facility]) Active Problems:   Transaminitis   Essential hypertension   Hospital Course: Taken from H&P.  Caleb Rivers is a 68 y.o. male with medical history significant of PUD, diverticulosis, HTN and SBO with diagnostic lap in 02/25/23 who presented to ED with constipation/abdominal pain.  No nausea or vomiting, last bowel movement was on 2/15.  CT abdomen and pelvis with concern of SBO, transition point in the right abdomen, favoring adhesion.  Also concern of a small bowel lesion.  General surgery was consulted.  NG tube was placed with SBO protocol.  2/17: Vital and labs stable, patient started having bowel movement with improvement of abdominal pain, no nausea or vomiting, NG tube was disconnected.  Patient is going for abdominal imaging per his SBO protocol and if contrast is going through then surgery will likely start on diet.  2/18: Remained hemodynamically stable.  Able to tolerate soft diet.  Abdominal imaging with contrast in the colon, remained mildly dilated.  General surgery cleared him for discharge.  Patient was instructed to avoid constipation and slowly advance his diet.  No nausea or vomiting.  He will continue on current medications and follow-up with his providers closely for further management.     Assessment and Plan: * Small bowel obstruction (HCC) 68 year old male with hx of SBO with diagnostic lap in 01/2023 who presented to ED with complaints of intermittent abdominal pain and constipation found to have SBO with transition point in right abdomen at an apparent  angulated loop, favoring adhesion. Symptoms resolved, able to tolerate soft diet.  Repeat imaging with contrast in colon.  Transaminitis Improved.  Hepatitis panel negative.  Tylenol levels negative Unremarkable findings on CT abdomen/pelvis today    Essential hypertension Has history of elevated blood pressure in his chart and px for amlodipine, but does not take medication -Starting on amlodipine 5 mg    Consultants: General Surgery Procedures performed: None Disposition: Home Diet recommendation:  Discharge Diet Orders (From admission, onward)     Start     Ordered   02/16/24 0000  Diet - low sodium heart healthy        02/16/24 1124           Cardiac diet DISCHARGE MEDICATION: Allergies as of 02/16/2024       Reactions   Asa [aspirin] Other (See Comments)   Current and Hx of GI ulcer - per Pt and friend/translator   Nsaids Other (See Comments)   Current and Hx of GI ulcer per pt and friend/translator   Peanut-containing Drug Products    Pain.          Medication List     STOP taking these medications    amoxicillin 500 MG capsule Commonly known as: AMOXIL   clarithromycin 500 MG tablet Commonly known as: BIAXIN       TAKE these medications    acetaminophen 500 MG tablet Commonly known as: TYLENOL Take 1 tablet (500 mg total) by mouth every 6 (six) hours as needed. What changed:  how much to take when to take this reasons to take this  amLODipine 5 MG tablet Commonly known as: NORVASC Take 1 tablet (5 mg total) by mouth daily. Start taking on: February 17, 2024 What changed: how much to take   ondansetron 4 MG tablet Commonly known as: ZOFRAN Take 1 tablet (4 mg total) by mouth every 6 (six) hours as needed for nausea. What changed:  when to take this reasons to take this        Follow-up Information     Grayce Sessions, NP. Schedule an appointment as soon as possible for a visit in 1 week(s).   Specialty: Internal  Medicine Contact information: [address] [place] [address] [phone]                Discharge Exam: Filed Weights   02/14/24 0735 02/14/24 0738  Weight: 99.8 kg 99.8 kg   General.  Well-developed gentleman, in no acute distress. Pulmonary.  Lungs clear bilaterally, normal respiratory effort. CV.  Regular rate and rhythm, no JVD, rub or murmur. Abdomen.  Soft, nontender, nondistended, BS positive. CNS.  Alert and oriented .  No focal neurologic deficit. Extremities.  No edema, no cyanosis, pulses intact and symmetrical. Psychiatry.  Judgment and insight appears normal.   Condition at discharge: stable  The results of significant diagnostics from this hospitalization (including imaging, microbiology, ancillary and laboratory) are listed below for reference.   Imaging Studies: DG Abd Portable 1V-Small Bowel Obstruction Protocol-24 hr delay Result Date: 02/16/2024 CLINICAL DATA:  Confirm nasogastric tube placement. Small bowel obstruction. EXAM: PORTABLE ABDOMEN - 1 VIEW COMPARISON:  Radiograph yesterday FINDINGS: The enteric tube is not seen, the upper abdomen is not entirely included in the field of view. Interval clearing of contrast from the colon. Some residual contrast is seen in the transverse and rectosigmoid colon. Increasing increasing gaseous small bowel distension centrally up to 4.3 cm. No evidence of free air on this supine view. IMPRESSION: 1. The enteric tube is not seen, the upper abdomen is not entirely included in the field of view. 2. Interval clearing of enteric contrast from the colon with mild residual in transverse and sigmoid colon. 3. Increasing gaseous small bowel distension centrally. Electronically Signed   By: Narda Rutherford M.D.   On: 02/16/2024 10:08   DG Abd Portable 1V Result Date: 02/15/2024 CLINICAL DATA:  Follow-up small bowel obstruction. EXAM: PORTABLE ABDOMEN - 1 VIEW COMPARISON:  CT abdomen pelvis dated 02/14/2024. FINDINGS:  Enteric tube with tip in the distal stomach. Oral contrast noted throughout the colon. No bowel dilatation or evidence of obstruction. No free air. The osseous structures are intact. The soft tissues are unremarkable. IMPRESSION: 1. Enteric tube with tip in the distal stomach. 2. Nonobstructive bowel gas pattern. Electronically Signed   By: Elgie Collard M.D.   On: 02/15/2024 18:44   DG Abd Portable 1V-Small Bowel Protocol-Position Verification Result Date: 02/15/2024 CLINICAL DATA:  Nasogastric tube advancement for small-bowel protocol. EXAM: PORTABLE ABDOMEN - 1 VIEW COMPARISON:  Radiographs 02/14/2024 and 02/24/2023.  CT 02/14/2024. FINDINGS: 0612 hours. Enteric tube has been advanced, tip overlying the right upper quadrant of the abdomen, likely in the distal stomach or proximal duodenum. The visualized bowel gas pattern is normal. There is no supine evidence of free intraperitoneal air. Low lung volumes noted. IMPRESSION: Enteric tube tip overlies the distal stomach or proximal duodenum. Electronically Signed   By: Carey Bullocks M.D.   On: 02/15/2024 10:08   DG Abd Portable 1 View Addendum Date: 02/14/2024 ADDENDUM REPORT: 02/14/2024 13:48 ADDENDUM: Please note,  the nasogastric tube side hole is overlying the fundal region and tip overlying the proximal body region. Tube can be further advanced by 2-4 inches 2 put the tip and side hole in the midbody region. Electronically Signed   By: Jules Schick M.D.   On: 02/14/2024 13:48   Result Date: 02/14/2024 CLINICAL DATA:  Nasogastric tube placement. EXAM: PORTABLE ABDOMEN - 1 VIEW COMPARISON:  02/24/2023. FINDINGS: The bowel gas pattern is non-obstructive. No evidence of pneumoperitoneum. No acute osseous abnormalities. The soft tissues are within normal limits. Surgical changes, devices, tubes and lines: Nasogastric tube is seen with its tip and side hole overlying the left upper quadrant, overlying the proximal stomach region. IMPRESSION:  *Nonobstructive bowel gas pattern. Nasogastric tube is seen with its tip and side hole overlying the left upper quadrant, overlying the proximal stomach region. Electronically Signed: By: Jules Schick M.D. On: 02/14/2024 12:49   CT ABDOMEN PELVIS W CONTRAST Result Date: 02/14/2024 CLINICAL DATA:  Acute abdominal pain EXAM: CT ABDOMEN AND PELVIS WITH CONTRAST TECHNIQUE: Multidetector CT imaging of the abdomen and pelvis was performed using the standard protocol following bolus administration of intravenous contrast. RADIATION DOSE REDUCTION: This exam was performed according to the departmental dose-optimization program which includes automated exposure control, adjustment of the mA and/or kV according to patient size and/or use of iterative reconstruction technique. CONTRAST:  75mL OMNIPAQUE IOHEXOL 350 MG/ML SOLN COMPARISON:  07/18/2023 FINDINGS: Lower chest: Small type 1 hiatal hernia. Hepatobiliary: Unremarkable Pancreas: Unremarkable Spleen: Unremarkable Adrenals/Urinary Tract: No significant findings. Stomach/Bowel: Dilated loops of small bowel up to 3.8 cm in diameter are observed extending to a transition point in the right abdomen at an apparent angulated loop as shown on image 82 series 6. Questionable accentuated enhancement along the medial wall of the transition point, underlying small bowel lesion difficult to totally exclude. The small bowel loops distal to this transition are of normal caliber. Vascular/Lymphatic: Atherosclerosis is present, including aortoiliac atherosclerotic disease. Reproductive: Unremarkable Other: No supplemental non-categorized findings. Musculoskeletal: Unremarkable IMPRESSION: 1. Small bowel obstruction with transition point in the right abdomen (image 82 series 6) at an apparent angulated loop, favoring adhesion. Questionable accentuated enhancement along the medial wall of the transition point, underlying small bowel lesion difficult to totally exclude. Surgical  consultation is recommended. 2. Small type 1 hiatal hernia. 3. Aortic atherosclerosis. Aortic Atherosclerosis (ICD10-I70.0). Electronically Signed   By: Gaylyn Rong M.D.   On: 02/14/2024 10:41    Microbiology: Results for orders placed or performed during the hospital encounter of 07/18/23  Gastrointestinal Panel by PCR , Stool     Status: Abnormal   Collection Time: 07/19/23  6:01 AM   Specimen: Stool  Result Value Ref Range Status   Campylobacter species NOT DETECTED NOT DETECTED Final   Plesimonas shigelloides NOT DETECTED NOT DETECTED Final   Salmonella species NOT DETECTED NOT DETECTED Final   Yersinia enterocolitica NOT DETECTED NOT DETECTED Final   Vibrio species NOT DETECTED NOT DETECTED Final   Vibrio cholerae NOT DETECTED NOT DETECTED Final   Enteroaggregative E coli (EAEC) NOT DETECTED NOT DETECTED Final   Enterotoxigenic E coli (ETEC) NOT DETECTED NOT DETECTED Final   Shiga like toxin producing E coli (STEC) DETECTED (A) NOT DETECTED Final    Comment: RESULT CALLED TO, READ BACK BY AND VERIFIED WITH:  BRENDA MICHELSON 1357 07/19/2023 CP    E. coli O157 NOT DETECTED NOT DETECTED Final   Shigella/Enteroinvasive E coli (EIEC) NOT DETECTED NOT DETECTED Final   Cryptosporidium NOT  DETECTED NOT DETECTED Final   Cyclospora cayetanensis NOT DETECTED NOT DETECTED Final   Entamoeba histolytica NOT DETECTED NOT DETECTED Final   Giardia lamblia NOT DETECTED NOT DETECTED Final   Adenovirus F40/41 NOT DETECTED NOT DETECTED Final   Astrovirus NOT DETECTED NOT DETECTED Final   Norovirus GI/GII NOT DETECTED NOT DETECTED Final   Rotavirus A NOT DETECTED NOT DETECTED Final   Sapovirus (I, II, IV, and V) NOT DETECTED NOT DETECTED Final    Comment: Performed at [facility], [address] [place], [place] [address]    Labs: CBC: Recent Labs  Lab 02/14/24 0741 02/15/24 0432  WBC 5.9 7.1  NEUTROABS 3.2  --   HGB 17.0 16.3  HCT 50.8 48.0  MCV 93.4 93.4  PLT 209 229    Basic Metabolic Panel: Recent Labs  Lab 02/14/24 0741 02/14/24 1359 02/15/24 0432  NA 137  --  137  K 4.0  --  4.0  CL 101  --  105  CO2 23  --  24  GLUCOSE 111*  --  105*  BUN 20  --  14  CREATININE 1.10  --  1.07  CALCIUM 9.5  --  9.1  MG  --  2.0  --    Liver Function Tests: Recent Labs  Lab 02/14/24 0741 02/15/24 0432  AST 42* 33  ALT 48* 36  ALKPHOS 80 60  BILITOT 0.5 0.8  PROT 7.9 7.0  ALBUMIN 4.0 3.4*   CBG: No results for input(s): "GLUCAP" in the last 168 hours.  Discharge time spent: greater than 30 minutes.  This record has been created using Conservation officer, historic buildings. Errors have been sought and corrected,but may not always be located. Such creation errors do not reflect on the standard of care.   Signed: Arnetha Courser, MD [facility] 02/16/2024

## 2024-02-16 NOTE — Progress Notes (Addendum)
Subjective: Doing well today.  NGT clamped and he is tolerating his CLD with no nausea.  Moving his bowels.  ROS: See above, otherwise other systems negative  Objective: Vital signs in last 24 hours: Temp:  [97.5 F (36.4 C)-98.1 F (36.7 C)] 98 F (36.7 C) (02/18 0746) Pulse Rate:  [67-90] 90 (02/18 0746) Resp:  [18-19] 18 (02/18 0746) BP: (137-156)/(78-94) 156/89 (02/18 0746) SpO2:  [100 %] 100 % (02/18 0746) Last BM Date : 02/15/24  Intake/Output from previous day: 02/17 0701 - 02/18 0700 In: -  Out: 2200 [Urine:1000; Emesis/NG output:1200] Intake/Output this shift: No intake/output data recorded.  PE: Abd: soft, NT, ND, +BS, NGT clamped  Lab Results:  Recent Labs    02/14/24 0741 02/15/24 0432  WBC 5.9 7.1  HGB 17.0 16.3  HCT 50.8 48.0  PLT 209 229   BMET Recent Labs    02/14/24 0741 02/15/24 0432  NA 137 137  K 4.0 4.0  CL 101 105  CO2 23 24  GLUCOSE 111* 105*  BUN 20 14  CREATININE 1.10 1.07  CALCIUM 9.5 9.1   PT/INR Recent Labs    02/14/24 1359  LABPROT 13.0  INR 1.0   CMP     Component Value Date/Time   NA 137 02/15/2024 0432   K 4.0 02/15/2024 0432   CL 105 02/15/2024 0432   CO2 24 02/15/2024 0432   GLUCOSE 105 (H) 02/15/2024 0432   BUN 14 02/15/2024 0432   CREATININE 1.07 02/15/2024 0432   CALCIUM 9.1 02/15/2024 0432   PROT 7.0 02/15/2024 0432   ALBUMIN 3.4 (L) 02/15/2024 0432   AST 33 02/15/2024 0432   ALT 36 02/15/2024 0432   ALKPHOS 60 02/15/2024 0432   BILITOT 0.8 02/15/2024 0432   GFRNONAA >60 02/15/2024 0432   GFRAA >60 09/19/2020 0804   Lipase     Component Value Date/Time   LIPASE 38 02/14/2024 0741       Studies/Results: DG Abd Portable 1V Result Date: 02/15/2024 CLINICAL DATA:  Follow-up small bowel obstruction. EXAM: PORTABLE ABDOMEN - 1 VIEW COMPARISON:  CT abdomen pelvis dated 02/14/2024. FINDINGS: Enteric tube with tip in the distal stomach. Oral contrast noted throughout the colon. No bowel  dilatation or evidence of obstruction. No free air. The osseous structures are intact. The soft tissues are unremarkable. IMPRESSION: 1. Enteric tube with tip in the distal stomach. 2. Nonobstructive bowel gas pattern. Electronically Signed   By: Elgie Collard M.D.   On: 02/15/2024 18:44   DG Abd Portable 1V-Small Bowel Protocol-Position Verification Result Date: 02/15/2024 CLINICAL DATA:  Nasogastric tube advancement for small-bowel protocol. EXAM: PORTABLE ABDOMEN - 1 VIEW COMPARISON:  Radiographs 02/14/2024 and 02/24/2023.  CT 02/14/2024. FINDINGS: 0612 hours. Enteric tube has been advanced, tip overlying the right upper quadrant of the abdomen, likely in the distal stomach or proximal duodenum. The visualized bowel gas pattern is normal. There is no supine evidence of free intraperitoneal air. Low lung volumes noted. IMPRESSION: Enteric tube tip overlies the distal stomach or proximal duodenum. Electronically Signed   By: Carey Bullocks M.D.   On: 02/15/2024 10:08   DG Abd Portable 1 View Addendum Date: 02/14/2024 ADDENDUM REPORT: 02/14/2024 13:48 ADDENDUM: Please note, the nasogastric tube side hole is overlying the fundal region and tip overlying the proximal body region. Tube can be further advanced by 2-4 inches 2 put the tip and side hole in the midbody region. Electronically Signed   By: Jules Schick  M.D.   On: 02/14/2024 13:48   Result Date: 02/14/2024 CLINICAL DATA:  Nasogastric tube placement. EXAM: PORTABLE ABDOMEN - 1 VIEW COMPARISON:  02/24/2023. FINDINGS: The bowel gas pattern is non-obstructive. No evidence of pneumoperitoneum. No acute osseous abnormalities. The soft tissues are within normal limits. Surgical changes, devices, tubes and lines: Nasogastric tube is seen with its tip and side hole overlying the left upper quadrant, overlying the proximal stomach region. IMPRESSION: *Nonobstructive bowel gas pattern. Nasogastric tube is seen with its tip and side hole overlying the left  upper quadrant, overlying the proximal stomach region. Electronically Signed: By: Jules Schick M.D. On: 02/14/2024 12:49   CT ABDOMEN PELVIS W CONTRAST Result Date: 02/14/2024 CLINICAL DATA:  Acute abdominal pain EXAM: CT ABDOMEN AND PELVIS WITH CONTRAST TECHNIQUE: Multidetector CT imaging of the abdomen and pelvis was performed using the standard protocol following bolus administration of intravenous contrast. RADIATION DOSE REDUCTION: This exam was performed according to the departmental dose-optimization program which includes automated exposure control, adjustment of the mA and/or kV according to patient size and/or use of iterative reconstruction technique. CONTRAST:  75mL OMNIPAQUE IOHEXOL 350 MG/ML SOLN COMPARISON:  07/18/2023 FINDINGS: Lower chest: Small type 1 hiatal hernia. Hepatobiliary: Unremarkable Pancreas: Unremarkable Spleen: Unremarkable Adrenals/Urinary Tract: No significant findings. Stomach/Bowel: Dilated loops of small bowel up to 3.8 cm in diameter are observed extending to a transition point in the right abdomen at an apparent angulated loop as shown on image 82 series 6. Questionable accentuated enhancement along the medial wall of the transition point, underlying small bowel lesion difficult to totally exclude. The small bowel loops distal to this transition are of normal caliber. Vascular/Lymphatic: Atherosclerosis is present, including aortoiliac atherosclerotic disease. Reproductive: Unremarkable Other: No supplemental non-categorized findings. Musculoskeletal: Unremarkable IMPRESSION: 1. Small bowel obstruction with transition point in the right abdomen (image 82 series 6) at an apparent angulated loop, favoring adhesion. Questionable accentuated enhancement along the medial wall of the transition point, underlying small bowel lesion difficult to totally exclude. Surgical consultation is recommended. 2. Small type 1 hiatal hernia. 3. Aortic atherosclerosis. Aortic Atherosclerosis  (ICD10-I70.0). Electronically Signed   By: Gaylyn Rong M.D.   On: 02/14/2024 10:41    Anti-infectives: Anti-infectives (From admission, onward)    None        Assessment/Plan SBO -resolving -DC NGT -new plain film reviewed with air and contrast in his colon.  Still with some small bowel dilatation, but overall improving. -tolerating CLD, adv to soft diet.  If he tolerates this, he is surgically stable to DC home later today -d/w patient and primary service.   FEN - soft VTE - ok for chemical prophylaxis from our standpoint ID - none needed  I reviewed hospitalist notes, last 24 h vitals and pain scores, last 48 h intake and output, last 24 h labs and trends, and last 24 h imaging results.   LOS: 2 days    Letha Cape , [facility] 02/16/2024, 8:09 AM Please see Amion for pager number during day hours 7:00am-4:30pm or 7:00am -11:30am on weekends

## 2024-02-17 ENCOUNTER — Telehealth: Payer: Self-pay

## 2024-02-17 NOTE — Transitions of Care (Post Inpatient/ED Visit) (Signed)
   02/17/2024  Name: Caleb Rivers MRN: [id number] DOB: [birth year]  Today's TOC FU Call Status: Today's TOC FU Call Status:: Successful TOC FU Call Completed TOC FU Call Complete Date: 02/17/24 Patient's Name and Date of Birth confirmed.  Transition Care Management Follow-up Telephone Call Date of Discharge: 02/16/24 Discharge Facility: [name]) Type of Discharge: Inpatient Admission Primary Inpatient Discharge Diagnosis:: SBO How have you been since you were released from the hospital?: Better Any questions or concerns?: Yes Patient Questions/Concerns:: He said he feels gassy but has not moved his bowels since coming home.  He said he has been eating, mostly soup, and he has been drinking water and is up and moving around. We reviewed the instructions on AVS to take metamucil or miralax and he said he has taken the miralax today and it still early in the morning. Patient Questions/Concerns Addressed: Other: (discussed with patient.)  Items Reviewed: Did you receive and understand the discharge instructions provided?: Yes Medications obtained,verified, and reconciled?: Yes (Medications Reviewed) (He has all medications and did not have any questions about the med regime) Any new allergies since your discharge?: No Dietary orders reviewed?: Yes Type of Diet Ordered:: heart healthy, soft. Do you have support at home?: Yes Name of Support/Comfort Primary Source: He said he has 2 people at home who can help him if needed  Medications Reviewed Today: Medications Reviewed Today     Reviewed by Robyne Peers, RN (Case Manager) on 02/17/24 at 0920  Med List Status: <None>   Medication Order Taking? Sig Documenting Provider Last Dose Status Informant  acetaminophen (TYLENOL) 500 MG tablet [id number] No Take 1 tablet (500 mg total) by mouth every 6 (six) hours as needed.  Patient taking differently: Take 1,000 mg by mouth daily as needed for moderate pain (pain score 4-6).   Sabas Sous, MD 02/13/2024 Morning Active Self           Med Note ([place], Genoveva Ill   Sun Feb 14, 2024 10:03 PM) Patient verified he is taking as needed   amLODipine (NORVASC) 5 MG tablet [id number]  Take 1 tablet (5 mg total) by mouth daily. Arnetha Courser, MD  Active   ondansetron [facility]) 4 MG tablet [id number]  Take 1 tablet (4 mg total) by mouth every 6 (six) hours as needed for nausea. Arnetha Courser, MD  Active             Home Care and Equipment/Supplies: Were Home Health Services Ordered?: No Any new equipment or medical supplies ordered?: No  Functional Questionnaire: Do you need assistance with bathing/showering or dressing?: No Do you need assistance with meal preparation?: No Do you need assistance with eating?: No Do you have difficulty maintaining continence: No Do you need assistance with getting out of bed/getting out of a chair/moving?: No Do you have difficulty managing or taking your medications?: No  Follow up appointments reviewed: PCP Follow-up appointment confirmed?: Yes Date of PCP follow-up appointment?: 03/02/24 Follow-up Provider: Gwinda Passe, NP Specialist Hospital Follow-up appointment confirmed?: NA Do you need transportation to your follow-up appointment?: No Do you understand care options if your condition(s) worsen?: Yes-patient verbalized understanding    SIGNATURE Robyne Peers, RN

## 2024-03-02 ENCOUNTER — Encounter (INDEPENDENT_AMBULATORY_CARE_PROVIDER_SITE_OTHER): Payer: Self-pay | Admitting: Primary Care

## 2024-03-02 ENCOUNTER — Other Ambulatory Visit: Payer: Self-pay

## 2024-03-02 ENCOUNTER — Ambulatory Visit (INDEPENDENT_AMBULATORY_CARE_PROVIDER_SITE_OTHER): Payer: Self-pay | Admitting: Primary Care

## 2024-03-02 VITALS — BP 136/80 | HR 77 | Temp 98.3°F | Resp 14 | Wt 216.4 lb

## 2024-03-02 DIAGNOSIS — Z09 Encounter for follow-up examination after completed treatment for conditions other than malignant neoplasm: Secondary | ICD-10-CM

## 2024-03-02 DIAGNOSIS — Z1211 Encounter for screening for malignant neoplasm of colon: Secondary | ICD-10-CM

## 2024-03-02 DIAGNOSIS — K5900 Constipation, unspecified: Secondary | ICD-10-CM

## 2024-03-02 MED ORDER — SENNA-DOCUSATE SODIUM 8.6-50 MG PO TABS
1.0000 | ORAL_TABLET | Freq: Every day | ORAL | 1 refills | Status: DC
  Filled 2024-03-02: qty 100, 100d supply, fill #0

## 2024-03-02 NOTE — Progress Notes (Signed)
 Subjective:   Caleb Rivers is a 68 y.o. male presents for hospital follow up . He  presented to ED on 02/14/24 with constipation/abdominal pain. He was admitted Admitted patient was discharged from the hospital on 02/16/24, patient was admitted for: Small bowel obstruction ,Transaminitis  And Essential hypertension.  Today he is feeling a lot better.  Abdomen still tender on palpation  Past Medical History:  Diagnosis Date   Constipation    Gastric ulcer    Ulcer      Allergies  Allergen Reactions   Asa [Aspirin] Other (See Comments)    Current and Hx of GI ulcer - per Pt and friend/translator   Nsaids Other (See Comments)    Current and Hx of GI ulcer per pt and friend/translator   Peanut-Containing Drug Products     Pain.      Current Outpatient Medications on File Prior to Visit  Medication Sig Dispense Refill   acetaminophen (TYLENOL) 500 MG tablet Take 1 tablet (500 mg total) by mouth every 6 (six) hours as needed. 30 tablet 0   amLODipine (NORVASC) 5 MG tablet Take 1 tablet (5 mg total) by mouth daily. 90 tablet 1   ondansetron (ZOFRAN) 4 MG tablet Take 1 tablet (4 mg total) by mouth every 6 (six) hours as needed for nausea. 20 tablet 0   No current facility-administered medications on file prior to visit.    Review of System:  Comprehensive ROS Pertinent positive and negative noted in HPI   Objective:  BP 136/80 (BP Location: Right Arm, Patient Position: Sitting, Cuff Size: Normal)   Pulse 77   Temp 98.3 F (36.8 C) (Oral)   Resp 14   Wt 216 lb 6.4 oz (98.2 kg)   SpO2 99%   BMI 25.66 kg/m   Filed Weights   03/02/24 1018  Weight: 216 lb 6.4 oz (98.2 kg)    Physical Exam Vitals reviewed.  HENT:     Head: Normocephalic.     Right Ear: Tympanic membrane and external ear normal.     Left Ear: Tympanic membrane and external ear normal.     Nose: Nose normal.  Eyes:     Extraocular Movements: Extraocular movements intact.     Pupils: Pupils are equal,  round, and reactive to light.  Cardiovascular:     Rate and Rhythm: Normal rate and regular rhythm.  Pulmonary:     Effort: Pulmonary effort is normal.     Breath sounds: Normal breath sounds.  Abdominal:     General: Bowel sounds are normal. There is distension.     Palpations: Abdomen is soft.     Comments: Tender on palpation  Musculoskeletal:        General: Normal range of motion.  Skin:    General: Skin is warm and dry.  Neurological:     Mental Status: He is oriented to person, place, and time.  Psychiatric:        Mood and Affect: Mood normal.        Behavior: Behavior normal.        Thought Content: Thought content normal.        Judgment: Judgment normal.      Assessment:  Caleb Rivers was seen today for hospitalization follow-up.  Diagnoses and all orders for this visit:  Hospital discharge follow-up See HPI -     CBC with Differential -     Basic Metabolic Panel  Colon cancer screening -     Fecal occult  blood, imunochemical; Future  Constipation, unspecified constipation type -     sennosides-docusate sodium (SENOKOT-S) 8.6-50 MG tablet; Take 1 tablet by mouth daily.     This note has been created with Education officer, environmental. Any transcriptional errors are unintentional.   Return in about 3 months (around 06/02/2024).  Grayce Sessions, NP 03/06/2024, 4:20 PM

## 2024-03-02 NOTE — Progress Notes (Signed)
 Pt is here for hos f/u   Complaining of constipation, tried OTC lax with no relief per pt

## 2024-03-03 LAB — BASIC METABOLIC PANEL
BUN/Creatinine Ratio: 21 (ref 10–24)
BUN: 25 mg/dL (ref 8–27)
CO2: 18 mmol/L — ABNORMAL LOW (ref 20–29)
Calcium: 9.8 mg/dL (ref 8.6–10.2)
Chloride: 101 mmol/L (ref 96–106)
Creatinine, Ser: 1.21 mg/dL (ref 0.76–1.27)
Glucose: 89 mg/dL (ref 70–99)
Potassium: 4.9 mmol/L (ref 3.5–5.2)
Sodium: 137 mmol/L (ref 134–144)
eGFR: 66 mL/min/{1.73_m2} (ref >59)

## 2024-03-03 LAB — CBC WITH DIFFERENTIAL/PLATELET
Basophils Absolute: 0 10*3/uL (ref 0.0–0.2)
Basos: 1 %
EOS (ABSOLUTE): 0.1 10*3/uL (ref 0.0–0.4)
Eos: 1 %
Hematocrit: 51.4 % — ABNORMAL HIGH (ref 37.5–51.0)
Hemoglobin: 16.5 g/dL (ref 13.0–17.7)
Immature Grans (Abs): 0 10*3/uL (ref 0.0–0.1)
Immature Granulocytes: 1 %
Lymphocytes Absolute: 2.7 10*3/uL (ref 0.7–3.1)
Lymphs: 41 %
MCH: 30.6 pg (ref 26.6–33.0)
MCHC: 32.1 g/dL (ref 31.5–35.7)
MCV: 95 fL (ref 79–97)
Monocytes Absolute: 0.7 10*3/uL (ref 0.1–0.9)
Monocytes: 10 %
Neutrophils Absolute: 3 10*3/uL (ref 1.4–7.0)
Neutrophils: 46 %
Platelets: 191 10*3/uL (ref 150–450)
RBC: 5.4 x10E6/uL (ref 4.14–5.80)
RDW: 13.3 % (ref 11.6–15.4)
WBC: 6.5 10*3/uL (ref 3.4–10.8)

## 2024-03-04 ENCOUNTER — Other Ambulatory Visit (INDEPENDENT_AMBULATORY_CARE_PROVIDER_SITE_OTHER): Payer: Self-pay

## 2024-03-04 DIAGNOSIS — Z1211 Encounter for screening for malignant neoplasm of colon: Secondary | ICD-10-CM

## 2024-03-06 ENCOUNTER — Encounter (INDEPENDENT_AMBULATORY_CARE_PROVIDER_SITE_OTHER): Payer: Self-pay | Admitting: Primary Care

## 2024-03-06 LAB — FECAL OCCULT BLOOD, IMMUNOCHEMICAL: Fecal Occult Bld: NEGATIVE

## 2024-03-06 LAB — SPECIMEN STATUS REPORT

## 2024-04-16 ENCOUNTER — Emergency Department (HOSPITAL_COMMUNITY): Payer: Self-pay

## 2024-04-16 ENCOUNTER — Emergency Department (HOSPITAL_COMMUNITY)
Admission: EM | Admit: 2024-04-16 | Discharge: 2024-04-17 | Disposition: A | Discharge status: Home / Self Care | Payer: Self-pay | Attending: Emergency Medicine | Admitting: Emergency Medicine

## 2024-04-16 ENCOUNTER — Encounter (HOSPITAL_COMMUNITY): Payer: Self-pay | Admitting: Emergency Medicine

## 2024-04-16 DIAGNOSIS — K5900 Constipation, unspecified: Secondary | ICD-10-CM | POA: Insufficient documentation

## 2024-04-16 DIAGNOSIS — N400 Enlarged prostate without lower urinary tract symptoms: Secondary | ICD-10-CM | POA: Insufficient documentation

## 2024-04-16 DIAGNOSIS — Z91010 Allergy to peanuts: Secondary | ICD-10-CM | POA: Insufficient documentation

## 2024-04-16 LAB — CBC WITH DIFFERENTIAL/PLATELET
Abs Immature Granulocytes: 0.05 10*3/uL (ref 0.00–0.07)
Basophils Absolute: 0 10*3/uL (ref 0.0–0.1)
Basophils Relative: 1 %
Eosinophils Absolute: 0.1 10*3/uL (ref 0.0–0.5)
Eosinophils Relative: 2 %
HCT: 45.3 % (ref 39.0–52.0)
Hemoglobin: 15.4 g/dL (ref 13.0–17.0)
Immature Granulocytes: 1 %
Lymphocytes Relative: 36 %
Lymphs Abs: 2.4 10*3/uL (ref 0.7–4.0)
MCH: 31.6 pg (ref 26.0–34.0)
MCHC: 34 g/dL (ref 30.0–36.0)
MCV: 92.8 fL (ref 80.0–100.0)
Monocytes Absolute: 1.1 10*3/uL — ABNORMAL HIGH (ref 0.1–1.0)
Monocytes Relative: 16 %
Neutro Abs: 2.9 10*3/uL (ref 1.7–7.7)
Neutrophils Relative %: 44 %
Platelets: 223 10*3/uL (ref 150–400)
RBC: 4.88 MIL/uL (ref 4.22–5.81)
RDW: 14.1 % (ref 11.5–15.5)
WBC: 6.5 10*3/uL (ref 4.0–10.5)
nRBC: 0 % (ref 0.0–0.2)

## 2024-04-16 LAB — COMPREHENSIVE METABOLIC PANEL WITH GFR
ALT: 49 U/L — ABNORMAL HIGH (ref 0–44)
AST: 45 U/L — ABNORMAL HIGH (ref 15–41)
Albumin: 3.9 g/dL (ref 3.5–5.0)
Alkaline Phosphatase: 64 U/L (ref 38–126)
Anion gap: 9 (ref 5–15)
BUN: 11 mg/dL (ref 8–23)
CO2: 23 mmol/L (ref 22–32)
Calcium: 9.4 mg/dL (ref 8.9–10.3)
Chloride: 101 mmol/L (ref 98–111)
Creatinine, Ser: 1 mg/dL (ref 0.61–1.24)
GFR, Estimated: >60 mL/min (ref >60)
Glucose, Bld: 104 mg/dL — ABNORMAL HIGH (ref 70–99)
Potassium: 4 mmol/L (ref 3.5–5.1)
Sodium: 133 mmol/L — ABNORMAL LOW (ref 135–145)
Total Bilirubin: 0.8 mg/dL (ref 0.0–1.2)
Total Protein: 7.7 g/dL (ref 6.5–8.1)

## 2024-04-16 LAB — LIPASE, BLOOD: Lipase: 32 U/L (ref 11–51)

## 2024-04-16 MED ORDER — POLYETHYLENE GLYCOL 3350 17 GM/SCOOP PO POWD
ORAL | 0 refills | Status: DC
  Filled 2024-04-16: qty 255, fill #0

## 2024-04-16 MED ORDER — IOHEXOL 350 MG/ML SOLN
75.0000 mL | Freq: Once | INTRAVENOUS | Status: AC | PRN
  Administered 2024-04-16: 75 mL via INTRAVENOUS

## 2024-04-16 NOTE — ED Notes (Signed)
 Patient transported to CT

## 2024-04-16 NOTE — ED Triage Notes (Signed)
 Pt here from home with c/o feeling full after he eats , has had BM and no abd pain but does have a history of SBO back in feb of this year

## 2024-04-16 NOTE — ED Provider Triage Note (Signed)
 Emergency Medicine Provider Triage Evaluation Note  Caleb Rivers , a 68 y.o. male  was evaluated in triage.  Pt complains of early satiety.  Patient reports that when he is eating, he feels very full very quickly.  States that he has been having bowel movements and still passing gas and denies minimal abdominal discomfort or pain.  He did have a bowel obstruction back in February of this year not requiring surgical intervention.  He denies any fever, chills, or bodyaches.  Review of Systems  Positive: As above Negative: As above  Physical Exam  BP (!) 151/91 (BP Location: Right Arm)   Pulse 77   Temp 98 F (36.7 C)   Resp 16   SpO2 100%  Gen:   Awake, no distress   Resp:  Normal effort  MSK:   Moves extremities without difficulty  Other:  No focal abdominal tenderness  Medical Decision Making  Medically screening exam initiated at 3:58 PM.  Appropriate orders placed.  Caleb Rivers was informed that the remainder of the evaluation will be completed by another provider, this initial triage assessment does not replace that evaluation, and the importance of remaining in the ED until their evaluation is complete.     Adalina Dopson A, PA-C 04/16/24 1559

## 2024-04-16 NOTE — Discharge Instructions (Addendum)
See your Physician for recheck.  °

## 2024-04-17 MED ORDER — LANSOPRAZOLE 30 MG PO CPDR
30.0000 mg | DELAYED_RELEASE_CAPSULE | Freq: Every day | ORAL | 1 refills | Status: DC
  Filled 2024-04-17: qty 30, 30d supply, fill #0
  Filled 2024-05-16: qty 30, 30d supply, fill #1

## 2024-04-17 MED ORDER — LANSOPRAZOLE 30 MG PO CPDR
30.0000 mg | DELAYED_RELEASE_CAPSULE | Freq: Every day | ORAL | 1 refills | Status: DC
  Filled 2024-04-17: qty 30, 30d supply, fill #0

## 2024-04-17 MED ORDER — POLYETHYLENE GLYCOL 3350 17 GM/SCOOP PO POWD
ORAL | 0 refills | Status: DC
  Filled 2024-04-17: qty 238, 11d supply, fill #0

## 2024-04-17 MED ORDER — LANSOPRAZOLE 30 MG PO CPDR: 30.0000 mg | DELAYED_RELEASE_CAPSULE | Freq: Every day | ORAL | 1 refills | Status: DC

## 2024-04-17 MED ORDER — POLYETHYLENE GLYCOL 3350 17 GM/SCOOP PO POWD: ORAL | 0 refills | Status: DC

## 2024-04-17 NOTE — ED Provider Notes (Signed)
 [place] EMERGENCY DEPARTMENT AT [facility] Provider Note   CSN: [id number] Arrival date & time: 04/16/24  1530     History  Chief Complaint  Patient presents with   Abdominal Pain    Caleb Rivers is a 68 y.o. male.  Patient complains of abdominal pain.  Patient reports that he has chronic episodes of constipation.  Patient reports recently he has had a very poor appetite and is not eating as much.  Patient complains of feeling like he cannot eat.  He reports he is on a medication for constipation.  Patient reports he feels like he needs something for his stomach.  Patient complains of stomach feeling irritated and full.  Patient has had a past medical history of gastritis reflux diverticulosis and small bowel obstruction.  The history is provided by the patient. A language interpreter was used.  Abdominal Pain Pain location:  Generalized Pain quality: aching and bloating   Pain radiates to:  Does not radiate Pain severity:  Moderate Onset quality:  Gradual Timing:  Constant Progression:  Worsening Chronicity:  New Context: not alcohol use   Relieved by:  Nothing  [employer] interpreter used.      Home Medications Prior to Admission medications   Medication Sig Start Date End Date Taking? Authorizing Provider  acetaminophen  (TYLENOL ) 500 MG tablet Take 1 tablet (500 mg total) by mouth every 6 (six) hours as needed. 06/19/23   Edson Graces, MD  amLODipine  (NORVASC ) 5 MG tablet Take 1 tablet (5 mg total) by mouth daily. 02/17/24   Amin, Sumayya, MD  lansoprazole  (PREVACID ) 30 MG capsule Take 1 capsule (30 mg total) by mouth daily. 04/17/24 04/17/25  Darlis Eisenmenger, PA-C  ondansetron  (ZOFRAN ) 4 MG tablet Take 1 tablet (4 mg total) by mouth every 6 (six) hours as needed for nausea. 02/16/24   Amin, Sumayya, MD  polyethylene glycol powder (MIRALAX ) 17 GM/SCOOP powder One dose twice a day for 3 days then once a day 04/17/24   Editha Goring A, PA-C  sennosides-docusate  sodium (SENOKOT-S) 8.6-50 MG tablet Take 1 tablet by mouth daily. 03/02/24   Marius Siemens, NP      Allergies    Asa [aspirin], Nsaids, and Peanut-containing drug products    Review of Systems   Review of Systems  Gastrointestinal:  Positive for abdominal pain.  All other systems reviewed and are negative.   Physical Exam Updated Vital Signs BP (!) 146/96 (BP Location: Right Arm)   Pulse 67   Temp 98 F (36.7 C) (Oral)   Resp 16   SpO2 100%  Physical Exam Vitals and nursing note reviewed.  Constitutional:      Appearance: He is well-developed.  HENT:     Head: Normocephalic.  Eyes:     Extraocular Movements: Extraocular movements intact.  Cardiovascular:     Rate and Rhythm: Normal rate.  Pulmonary:     Effort: Pulmonary effort is normal.  Abdominal:     General: Abdomen is flat. Bowel sounds are increased. There is no distension.     Palpations: Abdomen is soft.  Musculoskeletal:        General: Normal range of motion.     Cervical back: Normal range of motion.  Skin:    General: Skin is warm.  Neurological:     General: No focal deficit present.     Mental Status: He is alert and oriented to person, place, and time.     ED Results / Procedures /  Treatments   Labs (all labs ordered are listed, but only abnormal results are displayed) Labs Reviewed  CBC WITH DIFFERENTIAL/PLATELET - Abnormal; Notable for the following components:      Result Value   Monocytes Absolute 1.1 (*)    All other components within normal limits  COMPREHENSIVE METABOLIC PANEL WITH GFR - Abnormal; Notable for the following components:   Sodium 133 (*)    Glucose, Bld 104 (*)    AST 45 (*)    ALT 49 (*)    All other components within normal limits  LIPASE, BLOOD    EKG None  Radiology CT ABDOMEN PELVIS W CONTRAST Result Date: 04/16/2024 CLINICAL DATA:  Suspected bowel obstruction. EXAM: CT ABDOMEN AND PELVIS WITH CONTRAST TECHNIQUE: Multidetector CT imaging of the abdomen  and pelvis was performed using the standard protocol following bolus administration of intravenous contrast. RADIATION DOSE REDUCTION: This exam was performed according to the departmental dose-optimization program which includes automated exposure control, adjustment of the mA and/or kV according to patient size and/or use of iterative reconstruction technique. CONTRAST:  75mL OMNIPAQUE  IOHEXOL  350 MG/ML SOLN COMPARISON:  February 14, 2024 FINDINGS: Lower chest: No acute abnormality. Hepatobiliary: No focal liver abnormality is seen. No gallstones, gallbladder wall thickening, or biliary dilatation. Pancreas: Unremarkable. No pancreatic ductal dilatation or surrounding inflammatory changes. Spleen: Normal in size without focal abnormality. Adrenals/Urinary Tract: Adrenal glands are unremarkable. Kidneys are normal in size, without renal calculi or hydronephrosis. Small bilateral simple renal cysts are seen. Bladder is unremarkable. Stomach/Bowel: There is a small hiatal hernia. Appendix appears normal. No evidence of bowel wall thickening, distention, or inflammatory changes. Noninflamed diverticula are seen within the proximal and mid sigmoid colon. Vascular/Lymphatic: Aortic atherosclerosis. No enlarged abdominal or pelvic lymph nodes. Reproductive: There is mild to moderate severity prostate gland enlargement. Other: No abdominal wall hernia or abnormality. No abdominopelvic ascites. Musculoskeletal: No acute or significant osseous findings. IMPRESSION: 1. Small hiatal hernia. 2. Sigmoid diverticulosis. 3. Prostatomegaly.  Correlation with PSA levels is recommended. Electronically Signed   By: Virgle Grime M.D.   On: 04/16/2024 23:27    Procedures Procedures    Medications Ordered in ED Medications  iohexol  (OMNIPAQUE ) 350 MG/ML injection 75 mL (75 mLs Intravenous Contrast Given 04/16/24 2244)    ED Course/ Medical Decision Making/ A&P                                 Medical Decision  Making Patient complains of his abdomen feeling bloated.  Patient reports that he has had a poor appetite.  Patient has had a small bowel obstruction in the past.  He thinks that he probably has constipation.  He is requesting medicine to help with his stomach he has a history of gastritis and reflux.  Amount and/or Complexity of Data Reviewed Labs: ordered. Decision-making details documented in ED Course.    Details: Labs ordered reviewed and interpreted.  Patient has normal white blood cell count. Radiology: ordered and independent interpretation performed. Decision-making details documented in ED Course.    Details: CT abdomen and pelvis ordered.  CT scan shows no evidence of small bowel obstruction.  Patient does have an enlarged prostate.  Risk Risk Details: Patient is given a prescription for MiraLAX  he is advised to take MiraLAX  for constipation.  He is also given Prevacid  to help with reflux/gastritis symptoms.  Patient is discharged in stable condition.  Final Clinical Impression(s) / ED Diagnoses Final diagnoses:  Prostate enlargement    Rx / DC Orders ED Discharge Orders          Ordered    polyethylene glycol powder (MIRALAX ) 17 GM/SCOOP powder  Status:  Discontinued        04/16/24 2357    lansoprazole  (PREVACID ) 30 MG capsule  Daily,   Status:  Discontinued        04/17/24 0006    lansoprazole  (PREVACID ) 30 MG capsule  Daily,   Status:  Discontinued        04/17/24 0007    polyethylene glycol powder (MIRALAX ) 17 GM/SCOOP powder  Status:  Discontinued        04/17/24 0007    lansoprazole  (PREVACID ) 30 MG capsule  Daily        04/17/24 0100    polyethylene glycol powder (MIRALAX ) 17 GM/SCOOP powder        04/17/24 0100           An After Visit Summary was printed and given to the patient.    Sandi Crosby, PA-C 04/17/24 2256    Lowery Rue, DO 04/17/24 2305

## 2024-04-18 ENCOUNTER — Other Ambulatory Visit: Payer: Self-pay

## 2024-04-18 ENCOUNTER — Other Ambulatory Visit (HOSPITAL_COMMUNITY): Payer: Self-pay

## 2024-04-19 ENCOUNTER — Other Ambulatory Visit: Payer: Self-pay

## 2024-05-16 ENCOUNTER — Other Ambulatory Visit: Payer: Self-pay

## 2024-05-23 ENCOUNTER — Emergency Department (HOSPITAL_COMMUNITY)
Admission: EM | Admit: 2024-05-23 | Discharge: 2024-05-23 | Disposition: A | Discharge status: Home / Self Care | Payer: Self-pay

## 2024-05-23 DIAGNOSIS — Z91010 Allergy to peanuts: Secondary | ICD-10-CM | POA: Insufficient documentation

## 2024-05-23 DIAGNOSIS — R350 Frequency of micturition: Secondary | ICD-10-CM | POA: Insufficient documentation

## 2024-05-23 LAB — URINALYSIS, ROUTINE W REFLEX MICROSCOPIC
Bilirubin Urine: NEGATIVE
Glucose, UA: NEGATIVE mg/dL
Hgb urine dipstick: NEGATIVE
Ketones, ur: NEGATIVE mg/dL
Leukocytes,Ua: NEGATIVE
Nitrite: NEGATIVE
Protein, ur: NEGATIVE mg/dL
Specific Gravity, Urine: 1.008 (ref 1.005–1.030)
pH: 7 (ref 5.0–8.0)

## 2024-05-23 LAB — I-STAT CHEM 8, ED
BUN: 17 mg/dL (ref 8–23)
Calcium, Ion: 1.14 mmol/L — ABNORMAL LOW (ref 1.15–1.40)
Chloride: 105 mmol/L (ref 98–111)
Creatinine, Ser: 1.1 mg/dL (ref 0.61–1.24)
Glucose, Bld: 91 mg/dL (ref 70–99)
HCT: 48 % (ref 39.0–52.0)
Hemoglobin: 16.3 g/dL (ref 13.0–17.0)
Potassium: 4.2 mmol/L (ref 3.5–5.1)
Sodium: 140 mmol/L (ref 135–145)
TCO2: 24 mmol/L (ref 22–32)

## 2024-05-23 LAB — CBG MONITORING, ED: Glucose-Capillary: 158 mg/dL — ABNORMAL HIGH (ref 70–99)

## 2024-05-23 NOTE — ED Provider Notes (Signed)
 [name] EMERGENCY DEPARTMENT AT [facility] Provider Note   CSN: [id number] Arrival date & time: 05/23/24  1605     History  Chief Complaint  Patient presents with   Urinary Frequency    Caleb Rivers is a 68 y.o. male.  68 year old male presents to the emergency department for urinary frequency.  He notes for the past 5 days that he has gone to the bathroom more often.  Some intermittent suprapubic discomfort.  None currently.  No hematuria, no dysuria, no other abdominal pain.  No nausea, no vomiting.  No polyuria, polydipsia, polyphagia.  Has not had sex in years and has low suspicion for STI.   Urinary Frequency       Home Medications Prior to Admission medications   Medication Sig Start Date End Date Taking? Authorizing Provider  acetaminophen  (TYLENOL ) 500 MG tablet Take 1 tablet (500 mg total) by mouth every 6 (six) hours as needed. 06/19/23   Edson Graces, MD  amLODipine  (NORVASC ) 5 MG tablet Take 1 tablet (5 mg total) by mouth daily. 02/17/24   Amin, Sumayya, MD  lansoprazole  (PREVACID ) 30 MG capsule Take 1 capsule (30 mg total) by mouth daily. 04/17/24 04/17/25  Darlis Eisenmenger, PA-C  ondansetron  (ZOFRAN ) 4 MG tablet Take 1 tablet (4 mg total) by mouth every 6 (six) hours as needed for nausea. 02/16/24   Amin, Sumayya, MD  polyethylene glycol powder (MIRALAX ) 17 GM/SCOOP powder Mix 1 capful (17g) in 4-8 ounces of water twice daily for 3 days, then once daily thereafter. 04/17/24   Darlis Eisenmenger, PA-C  sennosides-docusate sodium  (SENOKOT-S) 8.6-50 MG tablet Take 1 tablet by mouth daily. 03/02/24   Marius Siemens, NP      Allergies    Asa [aspirin], Nsaids, and Peanut-containing drug products    Review of Systems   Review of Systems  Genitourinary:  Positive for frequency.    Physical Exam Updated Vital Signs BP (!) 155/87 (BP Location: Right Arm)   Pulse 78   Temp 97.6 F (36.4 C) (Oral)   Resp 17   SpO2 99%  Physical Exam Vitals and  nursing note reviewed.  Constitutional:      General: He is not in acute distress.    Appearance: He is not toxic-appearing.  HENT:     Head: Normocephalic.     Nose: Nose normal.  Eyes:     Conjunctiva/sclera: Conjunctivae normal.  Cardiovascular:     Rate and Rhythm: Normal rate and regular rhythm.  Pulmonary:     Effort: Pulmonary effort is normal.     Breath sounds: Normal breath sounds.  Abdominal:     General: Abdomen is flat. There is no distension.     Palpations: Abdomen is soft.     Tenderness: There is no abdominal tenderness. There is no guarding or rebound.  Musculoskeletal:        General: Normal range of motion.  Skin:    General: Skin is warm and dry.     Capillary Refill: Capillary refill takes less than 2 seconds.  Neurological:     Mental Status: He is alert and oriented to person, place, and time.  Psychiatric:        Mood and Affect: Mood normal.        Behavior: Behavior normal.     ED Results / Procedures / Treatments   Labs (all labs ordered are listed, but only abnormal results are displayed) Labs Reviewed  URINALYSIS, ROUTINE W REFLEX MICROSCOPIC -  Abnormal; Notable for the following components:      Result Value   Color, Urine STRAW (*)    All other components within normal limits  CBG MONITORING, ED - Abnormal; Notable for the following components:   Glucose-Capillary 158 (*)    All other components within normal limits  I-STAT CHEM 8, ED - Abnormal; Notable for the following components:   Calcium, Ion 1.14 (*)    All other components within normal limits    EKG None  Radiology No results found.  Procedures Procedures    Medications Ordered in ED Medications - No data to display  ED Course/ Medical Decision Making/ A&P Clinical Course as of 05/24/24 0050  Mon May 23, 2024  1704 Per my chart review had CT abd 04/16/24 :" IMPRESSION: 1. Small hiatal hernia. 2. Sigmoid diverticulosis. 3. Prostatomegaly.  Correlation with PSA  levels is recommended. " [TY]  2046 Creatinine: 1.10 [TY]    Clinical Course User Index [TY] Rolinda Climes, DO                                 Medical Decision Making Well-appearing 68 year old male presenting to the emergency department for frequency.  He is afebrile nontachycardic, slightly hypertensive.  Maintaining oxygen saturation on room air.  Benign abdominal exam, without tenderness.  UA not indicative of urinary tract infection.  I-STAT without kidney injury or metabolic derangements.  He does have an elevated blood sugar at 158, does not appear to be in DKA.  He denies other symptoms of diabetes.  Postvoid residual and without retention.  He is not sexually active, UTI unlikely.  Unclear etiology for his symptoms, perhaps secondary to his prostatomegaly/BPH. however given reassuring vitals and exam with isolated complaint of frequency and he can follow-up outpatient with urology and his primary doctor.  Stable for discharge at this time.  Amount and/or Complexity of Data Reviewed Labs: ordered. Decision-making details documented in ED Course.          Final Clinical Impression(s) / ED Diagnoses Final diagnoses:  Urinary frequency    Rx / DC Orders ED Discharge Orders          Ordered    Ambulatory referral to Urology        05/23/24 2117              Rolinda Climes, DO 05/24/24 0050

## 2024-05-23 NOTE — ED Provider Notes (Incomplete)
 [place] EMERGENCY DEPARTMENT AT [facility] Provider Note   CSN: [id number] Arrival date & time: 05/23/24  1605     History {Add pertinent medical, surgical, social history, OB history to HPI:1} Chief Complaint  Patient presents with  . Urinary Frequency    Caleb Rivers is a 68 y.o. male.  68 year old male presents to the emergency department for urinary frequency.  He notes for the past 5 days that he has gone to the bathroom more often.  Some intermittent suprapubic discomfort.  None currently.  No hematuria, no dysuria, no other abdominal pain.  No nausea, no vomiting.  No polyuria, polydipsia, polyphagia.  Has not had sex in years and has low suspicion for STI.   Urinary Frequency       Home Medications Prior to Admission medications   Medication Sig Start Date End Date Taking? Authorizing Provider  acetaminophen  (TYLENOL ) 500 MG tablet Take 1 tablet (500 mg total) by mouth every 6 (six) hours as needed. 06/19/23   Edson Graces, MD  amLODipine  (NORVASC ) 5 MG tablet Take 1 tablet (5 mg total) by mouth daily. 02/17/24   Amin, Sumayya, MD  lansoprazole  (PREVACID ) 30 MG capsule Take 1 capsule (30 mg total) by mouth daily. 04/17/24 04/17/25  Darlis Eisenmenger, PA-C  ondansetron  (ZOFRAN ) 4 MG tablet Take 1 tablet (4 mg total) by mouth every 6 (six) hours as needed for nausea. 02/16/24   Amin, Sumayya, MD  polyethylene glycol powder (MIRALAX ) 17 GM/SCOOP powder Mix 1 capful (17g) in 4-8 ounces of water twice daily for 3 days, then once daily thereafter. 04/17/24   Darlis Eisenmenger, PA-C  sennosides-docusate sodium  (SENOKOT-S) 8.6-50 MG tablet Take 1 tablet by mouth daily. 03/02/24   Marius Siemens, NP      Allergies    Asa [aspirin], Nsaids, and Peanut-containing drug products    Review of Systems   Review of Systems  Genitourinary:  Positive for frequency.    Physical Exam Updated Vital Signs BP (!) 155/87 (BP Location: Right Arm)   Pulse 78   Temp 97.6 F  (36.4 C) (Oral)   Resp 17   SpO2 99%  Physical Exam Vitals and nursing note reviewed.  Constitutional:      General: He is not in acute distress.    Appearance: He is not toxic-appearing.  HENT:     Head: Normocephalic.     Nose: Nose normal.  Eyes:     Conjunctiva/sclera: Conjunctivae normal.  Cardiovascular:     Rate and Rhythm: Normal rate and regular rhythm.  Pulmonary:     Effort: Pulmonary effort is normal.     Breath sounds: Normal breath sounds.  Abdominal:     General: Abdomen is flat. There is no distension.     Palpations: Abdomen is soft.     Tenderness: There is no abdominal tenderness. There is no guarding or rebound.  Musculoskeletal:        General: Normal range of motion.  Skin:    General: Skin is warm and dry.     Capillary Refill: Capillary refill takes less than 2 seconds.  Neurological:     Mental Status: He is alert and oriented to person, place, and time.  Psychiatric:        Mood and Affect: Mood normal.        Behavior: Behavior normal.     ED Results / Procedures / Treatments   Labs (all labs ordered are listed, but only abnormal results are  displayed) Labs Reviewed  URINALYSIS, ROUTINE W REFLEX MICROSCOPIC - Abnormal; Notable for the following components:      Result Value   Color, Urine STRAW (*)    All other components within normal limits  CBG MONITORING, ED - Abnormal; Notable for the following components:   Glucose-Capillary 158 (*)    All other components within normal limits  I-STAT CHEM 8, ED - Abnormal; Notable for the following components:   Calcium, Ion 1.14 (*)    All other components within normal limits    EKG None  Radiology No results found.  Procedures Procedures  {Document cardiac monitor, telemetry assessment procedure when appropriate:1}  Medications Ordered in ED Medications - No data to display  ED Course/ Medical Decision Making/ A&P Clinical Course as of 05/23/24 2243  Mon May 23, 2024  1704 Per my  chart review had CT abd 04/16/24 :" IMPRESSION: 1. Small hiatal hernia. 2. Sigmoid diverticulosis. 3. Prostatomegaly.  Correlation with PSA levels is recommended. " [TY]  2046 Creatinine: 1.10 [TY]    Clinical Course User Index [TY] Rolinda Climes, DO   {   Click here for ABCD2, HEART and other calculatorsREFRESH Note before signing :1}                              Medical Decision Making Well-appearing 68 year old male presenting to the emergency department for frequency.  He is afebrile nontachycardic, slightly hypertensive.  Maintaining oxygen saturation on room air.  Benign abdominal exam.  Amount and/or Complexity of Data Reviewed Labs: ordered. Decision-making details documented in ED Course.   ***  {Document critical care time when appropriate:1} {Document review of labs and clinical decision tools ie heart score, Chads2Vasc2 etc:1}  {Document your independent review of radiology images, and any outside records:1} {Document your discussion with family members, caretakers, and with consultants:1} {Document social determinants of health affecting pt's care:1} {Document your decision making why or why not admission, treatments were needed:1} Final Clinical Impression(s) / ED Diagnoses Final diagnoses:  Urinary frequency    Rx / DC Orders ED Discharge Orders          Ordered    Ambulatory referral to Urology        05/23/24 2117

## 2024-05-23 NOTE — ED Triage Notes (Signed)
 Pt complains of increased urinary frequency x 3 days. Worse at night. Pain in lower back and right groin.   Audio interpreter used.

## 2024-05-23 NOTE — ED Notes (Signed)
 Post void residual: 87mL

## 2024-05-23 NOTE — Discharge Instructions (Signed)
 Please try to limit your fluid intake this may alleviate some of your symptoms.  Please follow-up with your primary doctor as your blood sugar was elevated today which may indicate diabetes.  He also had a elevated prostate on your last CT scan which may be the cause of your symptoms.  Return if you have fevers, chills, severe abdominal pain, inability to urinate, or he develop any new or worsening symptoms that are concerning to you.

## 2024-06-09 ENCOUNTER — Ambulatory Visit: Payer: Self-pay | Admitting: Urology

## 2024-07-15 ENCOUNTER — Ambulatory Visit: Payer: Self-pay | Admitting: Urology

## 2024-07-15 NOTE — Progress Notes (Deleted)
 Assessment: 1. BPH with obstruction/lower urinary tract symptoms     Plan: I personally reviewed the patient's chart including provider notes, lab and imaging results.   Chief Complaint: No chief complaint on file.   History of Present Illness:  Caleb Rivers is a 68 y.o. male who is seen in consultation from Caron Salt, DO for evaluation of lower urinary tract symptoms. He was seen in the emergency room in April 2025 for abdominal pain.  CT abdomen and pelvis with contrast at that time demonstrated normal kidneys without stone or mass, no evidence of obstruction and mild to moderate enlarged prostate gland.  He return to the emergency room in May 2025 with complaints of urinary frequency.  Urinalysis at that time was unremarkable.   Past Medical History:  Past Medical History:  Diagnosis Date   Constipation    Gastric ulcer    Ulcer     Past Surgical History:  Past Surgical History:  Procedure Laterality Date   LAPAROSCOPY N/A 02/26/2023   Procedure: LAPAROSCOPY DIAGNOSTIC;  Surgeon: Teresa Lonni HERO, MD;  Location: [facility] OR;  Service: General;  Laterality: N/A;   LEFT HEART CATHETERIZATION WITH CORONARY ANGIOGRAM N/A 09/25/2013   Procedure: LEFT HEART CATHETERIZATION WITH CORONARY ANGIOGRAM;  Surgeon: Debby DELENA Sor, MD;  Location: [facility] CATH LAB;  Service: Cardiovascular;  Laterality: N/A;    Allergies:  Allergies  Allergen Reactions   Asa [Aspirin] Other (See Comments)    Current and Hx of GI ulcer - per Pt and friend/translator   Nsaids Other (See Comments)    Current and Hx of GI ulcer per pt and friend/translator   Peanut-Containing Drug Products     Pain.      Family History:  Family History  Problem Relation Age of Onset   Diabetes Mellitus II Neg Hx    Cancer Neg Hx     Social History:  Social History   Tobacco Use   Smoking status: Never   Smokeless tobacco: Never  Substance Use Topics   Alcohol use: No   Drug use: No    Review of  symptoms:  Constitutional:  Negative for unexplained weight loss, night sweats, fever, chills ENT:  Negative for nose bleeds, sinus pain, painful swallowing CV:  Negative for chest pain, shortness of breath, exercise intolerance, palpitations, loss of consciousness Resp:  Negative for cough, wheezing, shortness of breath GI:  Negative for nausea, vomiting, diarrhea, bloody stools GU:  Positives noted in HPI; otherwise negative for gross hematuria, dysuria, urinary incontinence Neuro:  Negative for seizures, poor balance, limb weakness, slurred speech Psych:  Negative for lack of energy, depression, anxiety Endocrine:  Negative for polydipsia, polyuria, symptoms of hypoglycemia (dizziness, hunger, sweating) Hematologic:  Negative for anemia, purpura, petechia, prolonged or excessive bleeding, use of anticoagulants  Allergic:  Negative for difficulty breathing or choking as a result of exposure to anything; no shellfish allergy; no allergic response (rash/itch) to materials, foods  Physical exam: There were no vitals taken for this visit. GENERAL APPEARANCE:  Well appearing, well developed, well nourished, NAD HEENT: Atraumatic, Normocephalic, oropharynx clear. NECK: Supple without lymphadenopathy or thyromegaly. LUNGS: Clear to auscultation bilaterally. HEART: Regular Rate and Rhythm without murmurs, gallops, or rubs. ABDOMEN: Soft, non-tender, No Masses. EXTREMITIES: Moves all extremities well.  Without clubbing, cyanosis, or edema. NEUROLOGIC:  Alert and oriented x 3, normal gait, CN II-XII grossly intact.  MENTAL STATUS:  Appropriate. BACK:  Non-tender to palpation.  No CVAT SKIN:  Warm, dry and intact.  GU: Penis:  {Exam; penis:5791} Meatus: {Meatus:15530} Scrotum: {pe scrotum:310183} Testis: {Exam; testicles:5790} Epididymis: {epididymis zkjf:688612} Prostate: {Exam; prostate:5793} Rectum: {rectal exam:26517}   Results: U/A:  PVR =

## 2024-07-25 ENCOUNTER — Emergency Department (HOSPITAL_COMMUNITY): Payer: Self-pay

## 2024-07-25 ENCOUNTER — Other Ambulatory Visit: Payer: Self-pay

## 2024-07-25 ENCOUNTER — Emergency Department (HOSPITAL_COMMUNITY)
Admission: EM | Admit: 2024-07-25 | Discharge: 2024-07-25 | Disposition: A | Discharge status: Home / Self Care | Payer: Self-pay | Attending: Emergency Medicine | Admitting: Emergency Medicine

## 2024-07-25 DIAGNOSIS — K2100 Gastro-esophageal reflux disease with esophagitis, without bleeding: Secondary | ICD-10-CM | POA: Insufficient documentation

## 2024-07-25 DIAGNOSIS — R1010 Upper abdominal pain, unspecified: Secondary | ICD-10-CM

## 2024-07-25 DIAGNOSIS — Z91010 Allergy to peanuts: Secondary | ICD-10-CM | POA: Insufficient documentation

## 2024-07-25 DIAGNOSIS — R7401 Elevation of levels of liver transaminase levels: Secondary | ICD-10-CM | POA: Insufficient documentation

## 2024-07-25 DIAGNOSIS — E871 Hypo-osmolality and hyponatremia: Secondary | ICD-10-CM | POA: Insufficient documentation

## 2024-07-25 DIAGNOSIS — Z8711 Personal history of peptic ulcer disease: Secondary | ICD-10-CM

## 2024-07-25 DIAGNOSIS — Z8719 Personal history of other diseases of the digestive system: Secondary | ICD-10-CM | POA: Insufficient documentation

## 2024-07-25 LAB — CBC WITH DIFFERENTIAL/PLATELET
Abs Immature Granulocytes: 0.06 K/uL (ref 0.00–0.07)
Basophils Absolute: 0.1 K/uL (ref 0.0–0.1)
Basophils Relative: 1 %
Eosinophils Absolute: 0 K/uL (ref 0.0–0.5)
Eosinophils Relative: 1 %
HCT: 49.9 % (ref 39.0–52.0)
Hemoglobin: 16.7 g/dL (ref 13.0–17.0)
Immature Granulocytes: 1 %
Lymphocytes Relative: 30 %
Lymphs Abs: 2.4 K/uL (ref 0.7–4.0)
MCH: 31.5 pg (ref 26.0–34.0)
MCHC: 33.5 g/dL (ref 30.0–36.0)
MCV: 94.2 fL (ref 80.0–100.0)
Monocytes Absolute: 0.9 K/uL (ref 0.1–1.0)
Monocytes Relative: 12 %
Neutro Abs: 4.5 K/uL (ref 1.7–7.7)
Neutrophils Relative %: 55 %
Platelets: 243 K/uL (ref 150–400)
RBC: 5.3 MIL/uL (ref 4.22–5.81)
RDW: 13.8 % (ref 11.5–15.5)
WBC: 7.9 K/uL (ref 4.0–10.5)
nRBC: 0 % (ref 0.0–0.2)

## 2024-07-25 LAB — COMPREHENSIVE METABOLIC PANEL WITH GFR
ALT: 55 U/L — ABNORMAL HIGH (ref 0–44)
AST: 42 U/L — ABNORMAL HIGH (ref 15–41)
Albumin: 4 g/dL (ref 3.5–5.0)
Alkaline Phosphatase: 76 U/L (ref 38–126)
Anion gap: 10 (ref 5–15)
BUN: 9 mg/dL (ref 8–23)
CO2: 21 mmol/L — ABNORMAL LOW (ref 22–32)
Calcium: 9.5 mg/dL (ref 8.9–10.3)
Chloride: 100 mmol/L (ref 98–111)
Creatinine, Ser: 1.03 mg/dL (ref 0.61–1.24)
GFR, Estimated: >60 mL/min (ref >60)
Glucose, Bld: 113 mg/dL — ABNORMAL HIGH (ref 70–99)
Potassium: 4.5 mmol/L (ref 3.5–5.1)
Sodium: 131 mmol/L — ABNORMAL LOW (ref 135–145)
Total Bilirubin: 0.8 mg/dL (ref 0.0–1.2)
Total Protein: 8.3 g/dL — ABNORMAL HIGH (ref 6.5–8.1)

## 2024-07-25 LAB — URINALYSIS, ROUTINE W REFLEX MICROSCOPIC
Bilirubin Urine: NEGATIVE
Glucose, UA: NEGATIVE mg/dL
Hgb urine dipstick: NEGATIVE
Ketones, ur: NEGATIVE mg/dL
Leukocytes,Ua: NEGATIVE
Nitrite: NEGATIVE
Protein, ur: NEGATIVE mg/dL
Specific Gravity, Urine: 1.013 (ref 1.005–1.030)
pH: 6 (ref 5.0–8.0)

## 2024-07-25 LAB — TROPONIN I (HIGH SENSITIVITY): Troponin I (High Sensitivity): 5 ng/L (ref <18)

## 2024-07-25 LAB — LIPASE, BLOOD: Lipase: 33 U/L (ref 11–51)

## 2024-07-25 MED ORDER — SUCRALFATE 1 G PO TABS
1.0000 g | ORAL_TABLET | Freq: Three times a day (TID) | ORAL | 0 refills | Status: DC
  Filled 2024-07-25 – 2024-08-03 (×2): qty 60, 15d supply, fill #0

## 2024-07-25 MED ORDER — LANSOPRAZOLE 30 MG PO CPDR
30.0000 mg | DELAYED_RELEASE_CAPSULE | Freq: Every day | ORAL | 1 refills | Status: DC
  Filled 2024-07-25 – 2024-07-26 (×2): qty 30, 30d supply, fill #0

## 2024-07-25 MED ORDER — SODIUM CHLORIDE 0.9 % IV BOLUS
500.0000 mL | Freq: Once | INTRAVENOUS | Status: AC
  Administered 2024-07-25: 500 mL via INTRAVENOUS

## 2024-07-25 MED ORDER — SUCRALFATE 1 G PO TABS
1.0000 g | ORAL_TABLET | Freq: Once | ORAL | Status: AC
  Administered 2024-07-25: 1 g via ORAL
  Filled 2024-07-25: qty 1

## 2024-07-25 MED ORDER — IOHEXOL 350 MG/ML SOLN
75.0000 mL | Freq: Once | INTRAVENOUS | Status: AC | PRN
  Administered 2024-07-25: 75 mL via INTRAVENOUS

## 2024-07-25 MED ORDER — FAMOTIDINE IN NACL 20-0.9 MG/50ML-% IV SOLN
20.0000 mg | Freq: Once | INTRAVENOUS | Status: AC
  Administered 2024-07-25: 20 mg via INTRAVENOUS
  Filled 2024-07-25: qty 50

## 2024-07-25 NOTE — ED Triage Notes (Signed)
 POV/ ambulatory/ broguht in by neighbor/ pt c/o ABD, vomiting,  feeling hot on the inside/ denies sig med hx

## 2024-07-25 NOTE — ED Provider Notes (Signed)
 [facility] AT [facility] Provider Note   CSN: [id number] Arrival date & time: 07/25/24  1338     Patient presents with: Abdominal Pain   Caleb Rivers is a 68 y.o. male.   Patient with history of reflux and stomach ulcers presents with burning warm sensation upper abdomen extending up his esophagus.  Patient's had this in the past.  Patient has been on antacid medicines in the past.  No blood or black color in the stool. Nonradiating discomfort.  Patient had mild nausea and vomiting.  Patient unsure EGD history.  The history is provided by the patient and a friend. The history is limited by a language barrier. A language interpreter was used.  Abdominal Pain Associated symptoms: nausea   Associated symptoms: no chest pain, no chills, no dysuria, no fever and no shortness of breath        Prior to Admission medications   Medication Sig Start Date End Date Taking? Authorizing Provider  sucralfate  (CARAFATE ) 1 g tablet Take 1 tablet (1 g total) by mouth 4 (four) times daily -  with meals and at bedtime. 07/25/24  Yes Tonia Chew, MD  acetaminophen  (TYLENOL ) 500 MG tablet Take 1 tablet (500 mg total) by mouth every 6 (six) hours as needed. 06/19/23   Theadore Ozell HERO, MD  amLODipine  (NORVASC ) 5 MG tablet Take 1 tablet (5 mg total) by mouth daily. 02/17/24   Amin, Sumayya, MD  lansoprazole  (PREVACID ) 30 MG capsule Take 1 capsule (30 mg total) by mouth daily. 07/25/24 07/25/25  Tonia Chew, MD  ondansetron  (ZOFRAN ) 4 MG tablet Take 1 tablet (4 mg total) by mouth every 6 (six) hours as needed for nausea. 02/16/24   Amin, Sumayya, MD  polyethylene glycol powder (MIRALAX ) 17 GM/SCOOP powder Mix 1 capful (17g) in 4-8 ounces of water twice daily for 3 days, then once daily thereafter. 04/17/24   Beverley Leita LABOR, PA-C  sennosides-docusate sodium  (SENOKOT-S) 8.6-50 MG tablet Take 1 tablet by mouth daily. 03/02/24   Celestia Rosaline SQUIBB, NP    Allergies: Asa  [aspirin], Nsaids, and Peanut-containing drug products    Review of Systems  Constitutional:  Negative for chills and fever.  HENT:  Negative for congestion.   Eyes:  Negative for visual disturbance.  Respiratory:  Negative for shortness of breath.   Cardiovascular:  Negative for chest pain.  Gastrointestinal:  Positive for abdominal pain and nausea. Negative for blood in stool.  Genitourinary:  Negative for dysuria and flank pain.  Musculoskeletal:  Negative for back pain, neck pain and neck stiffness.  Skin:  Negative for rash.  Neurological:  Negative for light-headedness and headaches.    Updated Vital Signs BP (!) 160/98 (BP Location: Right Arm)   Pulse 74   Temp 97.9 F (36.6 C) (Oral)   Resp 16   Ht 6' 5 (1.956 m)   Wt 98.2 kg   SpO2 100%   BMI 25.67 kg/m   Physical Exam Vitals and nursing note reviewed.  Constitutional:      General: He is not in acute distress.    Appearance: He is well-developed.  HENT:     Head: Normocephalic and atraumatic.     Mouth/Throat:     Mouth: Mucous membranes are moist.  Eyes:     General:        Right eye: No discharge.        Left eye: No discharge.     Conjunctiva/sclera: Conjunctivae normal.  Neck:  Trachea: No tracheal deviation.  Cardiovascular:     Rate and Rhythm: Normal rate and regular rhythm.     Heart sounds: No murmur heard. Pulmonary:     Effort: Pulmonary effort is normal.     Breath sounds: Normal breath sounds.  Abdominal:     General: There is no distension.     Palpations: Abdomen is soft.     Tenderness: There is abdominal tenderness in the epigastric area. There is no guarding.  Musculoskeletal:     Cervical back: Normal range of motion and neck supple. No rigidity.  Skin:    General: Skin is warm.     Capillary Refill: Capillary refill takes less than 2 seconds.     Findings: No rash.  Neurological:     General: No focal deficit present.     Mental Status: He is alert.     Cranial Nerves: No  cranial nerve deficit.  Psychiatric:        Mood and Affect: Mood normal.     (all labs ordered are listed, but only abnormal results are displayed) Labs Reviewed  COMPREHENSIVE METABOLIC PANEL WITH GFR - Abnormal; Notable for the following components:      Result Value   Sodium 131 (*)    CO2 21 (*)    Glucose, Bld 113 (*)    Total Protein 8.3 (*)    AST 42 (*)    ALT 55 (*)    All other components within normal limits  CBC WITH DIFFERENTIAL/PLATELET  LIPASE, BLOOD  URINALYSIS, ROUTINE W REFLEX MICROSCOPIC  TROPONIN I (HIGH SENSITIVITY)    EKG: EKG Interpretation Date/Time:  Monday July 25 2024 17:43:51 EDT Ventricular Rate:  67 PR Interval:  150 QRS Duration:  91 QT Interval:  414 QTC Calculation: 437 R Axis:   -20  Text Interpretation: Sinus rhythm Borderline left axis deviation Similar previous Confirmed by Tonia Chew [phone]) on 07/25/2024 5:47:49 PM  Radiology: CT ABDOMEN PELVIS W CONTRAST Result Date: 07/25/2024 CLINICAL DATA:  Abdominal pain, acute, nonlocalized EXAM: CT ABDOMEN AND PELVIS WITH CONTRAST TECHNIQUE: Multidetector CT imaging of the abdomen and pelvis was performed using the standard protocol following bolus administration of intravenous contrast. RADIATION DOSE REDUCTION: This exam was performed according to the departmental dose-optimization program which includes automated exposure control, adjustment of the mA and/or kV according to patient size and/or use of iterative reconstruction technique. CONTRAST:  75mL OMNIPAQUE  IOHEXOL  350 MG/ML SOLN COMPARISON:  April 16, 2024 FINDINGS: Lower chest: No focal airspace consolidation or pleural effusion. Hepatobiliary: No mass.No radiopaque stones or wall thickening of the gallbladder. No intrahepatic or extrahepatic biliary ductal dilation. The portal veins are patent. Pancreas: No mass or main ductal dilation. No peripancreatic inflammation or fluid collection. Spleen: Normal size. No mass. Adrenals/Urinary  Tract: No adrenal masses. A couple of subcentimeter hypodensities are noted in the kidneys, too small to definitively characterize, but likely small cysts. No nephrolithiasis or hydronephrosis. Circumferential wall thickening of the urinary bladder. Stomach/Bowel: Small hiatal hernia. The stomach is decompressed without focal abnormality. No small bowel wall thickening or inflammation. No small bowel obstruction.Normal appendix. Total colonic diverticulosis. No changes of acute diverticulitis. Vascular/Lymphatic: No aortic aneurysm. Scattered aortoiliac atherosclerosis. No intraabdominal or pelvic lymphadenopathy. Reproductive: Mild prostatomegaly.No free pelvic fluid. Other: No pneumoperitoneum, ascites, or mesenteric inflammation. Musculoskeletal: No acute fracture or destructive lesion. Multilevel thoracic osteophytosis. IMPRESSION: 1. Circumferential wall thickening of the urinary bladder, which is likely due to underdistension. If there is concern for acute cystitis, correlation  with urinalysis would be recommended. 2. Total colonic diverticulosis. No changes of acute diverticulitis. 3. Mild prostatomegaly. Aortic Atherosclerosis (ICD10-I70.0). Electronically Signed   By: Rogelia Myers M.D.   On: 07/25/2024 17:58     Procedures   Medications Ordered in the ED  famotidine  (PEPCID ) IVPB 20 mg premix (0 mg Intravenous Stopped 07/25/24 1741)  sucralfate  (CARAFATE ) tablet 1 g (1 g Oral Given 07/25/24 1655)  iohexol  (OMNIPAQUE ) 350 MG/ML injection 75 mL (75 mLs Intravenous Contrast Given 07/25/24 1726)  sodium chloride  0.9 % bolus 500 mL (0 mLs Intravenous Stopped 07/25/24 1903)                                    Medical Decision Making Amount and/or Complexity of Data Reviewed ECG/medicine tests: ordered.  Risk Prescription drug management.   Patient presents with moderate abdominal discomfort burning sensation and hot vapors most likely related to reflux/ulcer.  With patient's age atypical  cardiac in the differential EKG ordered and pending reviewed overall similar to previous sinus rhythm.  Troponins added on.  Minimal LFT elevation, mild hyponatremia 131, IV fluid bolus ordered.  Medical records reviewed unable to see note from gastroenterology.  IV Pepcid  and sucroferric given.  CT abdomen pelvis pending. Patient symptoms resolved on recheck.  CT scan results independently reviewed mild inflammation of the bladder diverticulosis without signs of significant inflammation.  No evidence of bleeding.  Discussed sucralfate  and PPI and outpatient follow-up.  Patient comfortable plan.      Final diagnoses:  Pain of upper abdomen  History of stomach ulcers  Gastroesophageal reflux disease with esophagitis without hemorrhage  Hyponatremia    ED Discharge Orders          Ordered    lansoprazole  (PREVACID ) 30 MG capsule  Daily        07/25/24 2015    sucralfate  (CARAFATE ) 1 g tablet  3 times daily with meals & bedtime        07/25/24 2015               Tonia Chew, MD 07/25/24 [phone]

## 2024-07-25 NOTE — ED Provider Triage Note (Signed)
 Emergency Medicine Provider Triage Evaluation Note  Caleb Rivers , a 68 y.o. male  was evaluated in triage.  Pt complains of abdominal pain that started 2 days ago.  He also has burning sensation radiate up his esophagus.  No chest pain, or shortness of breath.  Review of Systems  Positive: As above Negative: As above  Physical Exam  BP (!) 181/160   Pulse 99   Temp 98.6 F (37 C)   Resp 18   SpO2 100%  Gen:   Awake, no distress   Resp:  Normal effort  MSK:   Moves extremities without difficulty  Other:    Medical Decision Making  Medically screening exam initiated at 2:39 PM.  Appropriate orders placed.  Caleb Rivers was informed that the remainder of the evaluation will be completed by another provider, this initial triage assessment does not replace that evaluation, and the importance of remaining in the ED until their evaluation is complete.    Hildegard Loge, PA-C 07/25/24 1439

## 2024-07-25 NOTE — ED Notes (Signed)
 Patient transported to CT

## 2024-07-25 NOTE — Discharge Instructions (Addendum)
 Take your acid reflux medicines daily and use sucralfate  to help coat your stomach in case this is ulcers. Follow-up with primary doctor and gastroenterologist. Return for persistent blood or black color in the stools, fevers, severe pain or new concerns.

## 2024-07-26 ENCOUNTER — Other Ambulatory Visit (HOSPITAL_COMMUNITY): Payer: Self-pay

## 2024-07-26 ENCOUNTER — Other Ambulatory Visit: Payer: Self-pay

## 2024-07-31 ENCOUNTER — Emergency Department (HOSPITAL_COMMUNITY): Payer: Self-pay

## 2024-07-31 ENCOUNTER — Other Ambulatory Visit: Payer: Self-pay

## 2024-07-31 ENCOUNTER — Encounter (HOSPITAL_COMMUNITY): Payer: Self-pay | Admitting: Emergency Medicine

## 2024-07-31 ENCOUNTER — Inpatient Hospital Stay (HOSPITAL_COMMUNITY)
Admission: EM | Admit: 2024-07-31 | Discharge: 2024-08-02 | DRG: 390 | Disposition: A | Discharge status: Home / Self Care | Payer: Self-pay | Attending: Internal Medicine | Admitting: Internal Medicine

## 2024-07-31 ENCOUNTER — Inpatient Hospital Stay (HOSPITAL_COMMUNITY): Payer: Self-pay

## 2024-07-31 DIAGNOSIS — Z91010 Allergy to peanuts: Secondary | ICD-10-CM

## 2024-07-31 DIAGNOSIS — K219 Gastro-esophageal reflux disease without esophagitis: Secondary | ICD-10-CM | POA: Diagnosis present

## 2024-07-31 DIAGNOSIS — K2970 Gastritis, unspecified, without bleeding: Secondary | ICD-10-CM | POA: Diagnosis present

## 2024-07-31 DIAGNOSIS — Z8711 Personal history of peptic ulcer disease: Secondary | ICD-10-CM

## 2024-07-31 DIAGNOSIS — Z603 Acculturation difficulty: Secondary | ICD-10-CM | POA: Diagnosis present

## 2024-07-31 DIAGNOSIS — K56609 Unspecified intestinal obstruction, unspecified as to partial versus complete obstruction: Principal | ICD-10-CM | POA: Diagnosis present

## 2024-07-31 DIAGNOSIS — Z79899 Other long term (current) drug therapy: Secondary | ICD-10-CM

## 2024-07-31 DIAGNOSIS — I10 Essential (primary) hypertension: Secondary | ICD-10-CM | POA: Diagnosis present

## 2024-07-31 DIAGNOSIS — Z886 Allergy status to analgesic agent status: Secondary | ICD-10-CM

## 2024-07-31 LAB — COMPREHENSIVE METABOLIC PANEL WITH GFR
ALT: 65 U/L — ABNORMAL HIGH (ref 0–44)
AST: 39 U/L (ref 15–41)
Albumin: 3.3 g/dL — ABNORMAL LOW (ref 3.5–5.0)
Alkaline Phosphatase: 70 U/L (ref 38–126)
Anion gap: 7 (ref 5–15)
BUN: 16 mg/dL (ref 8–23)
CO2: 24 mmol/L (ref 22–32)
Calcium: 8.5 mg/dL — ABNORMAL LOW (ref 8.9–10.3)
Chloride: 103 mmol/L (ref 98–111)
Creatinine, Ser: 0.93 mg/dL (ref 0.61–1.24)
GFR, Estimated: >60 mL/min (ref >60)
Glucose, Bld: 116 mg/dL — ABNORMAL HIGH (ref 70–99)
Potassium: 3.9 mmol/L (ref 3.5–5.1)
Sodium: 134 mmol/L — ABNORMAL LOW (ref 135–145)
Total Bilirubin: 0.5 mg/dL (ref 0.0–1.2)
Total Protein: 7 g/dL (ref 6.5–8.1)

## 2024-07-31 LAB — I-STAT CHEM 8, ED
BUN: 14 mg/dL (ref 8–23)
Calcium, Ion: 1.17 mmol/L (ref 1.15–1.40)
Chloride: 102 mmol/L (ref 98–111)
Creatinine, Ser: 0.8 mg/dL (ref 0.61–1.24)
Glucose, Bld: 112 mg/dL — ABNORMAL HIGH (ref 70–99)
HCT: 46 % (ref 39.0–52.0)
Hemoglobin: 15.6 g/dL (ref 13.0–17.0)
Potassium: 3.9 mmol/L (ref 3.5–5.1)
Sodium: 137 mmol/L (ref 135–145)
TCO2: 22 mmol/L (ref 22–32)

## 2024-07-31 LAB — CBC
HCT: 50.4 % (ref 39.0–52.0)
Hemoglobin: 16.3 g/dL (ref 13.0–17.0)
MCH: 30.8 pg (ref 26.0–34.0)
MCHC: 32.3 g/dL (ref 30.0–36.0)
MCV: 95.1 fL (ref 80.0–100.0)
Platelets: 224 K/uL (ref 150–400)
RBC: 5.3 MIL/uL (ref 4.22–5.81)
RDW: 14.2 % (ref 11.5–15.5)
WBC: 8.8 K/uL (ref 4.0–10.5)
nRBC: 0 % (ref 0.0–0.2)

## 2024-07-31 LAB — LIPASE, BLOOD: Lipase: 28 U/L (ref 11–51)

## 2024-07-31 MED ORDER — HYDROMORPHONE HCL 1 MG/ML IJ SOLN
1.0000 mg | Freq: Once | INTRAMUSCULAR | Status: AC
  Administered 2024-07-31: 1 mg via INTRAVENOUS
  Filled 2024-07-31: qty 1

## 2024-07-31 MED ORDER — FAMOTIDINE IN NACL 20-0.9 MG/50ML-% IV SOLN
20.0000 mg | Freq: Once | INTRAVENOUS | Status: AC
  Administered 2024-07-31: 20 mg via INTRAVENOUS
  Filled 2024-07-31: qty 50

## 2024-07-31 MED ORDER — PANTOPRAZOLE SODIUM 40 MG IV SOLR
40.0000 mg | Freq: Every day | INTRAVENOUS | Status: DC
  Administered 2024-08-01 (×2): 40 mg via INTRAVENOUS
  Filled 2024-07-31 (×2): qty 10

## 2024-07-31 MED ORDER — ONDANSETRON HCL 4 MG/2ML IJ SOLN
4.0000 mg | Freq: Once | INTRAMUSCULAR | Status: AC
  Administered 2024-07-31: 4 mg via INTRAVENOUS
  Filled 2024-07-31: qty 2

## 2024-07-31 MED ORDER — SODIUM CHLORIDE 0.9 % IV BOLUS
1000.0000 mL | Freq: Once | INTRAVENOUS | Status: AC
  Administered 2024-07-31: 1000 mL via INTRAVENOUS

## 2024-07-31 MED ORDER — HYDRALAZINE HCL 20 MG/ML IJ SOLN: 10.0000 mg | INTRAMUSCULAR | Status: DC | PRN

## 2024-07-31 MED ORDER — IOHEXOL 300 MG/ML  SOLN
100.0000 mL | Freq: Once | INTRAMUSCULAR | Status: AC | PRN
  Administered 2024-07-31: 100 mL via INTRAVENOUS

## 2024-07-31 NOTE — ED Notes (Signed)
 ED TO INPATIENT HANDOFF REPORT  Name/Age/Gender Caleb Rivers 68 y.o. male  Code Status Code Status History     Date Active Date Inactive Code Status Order ID Comments User Context   02/14/2024 1219 02/16/2024 1824 Full Code [id number]  Waddell Rake, MD ED   07/18/2023 2300 07/20/2023 1552 Full Code [id number]  Lee Kingfisher, MD ED   02/24/2023 1857 02/27/2023 2051 Full Code [id number]  Laurita Cort DASEN, MD ED   09/05/2019 0525 09/07/2019 1608 Full Code [id number]  Franky Redia SAILOR, MD Inpatient   08/31/2014 0049 09/04/2014 1625 Full Code [id number]  Mavis Ritchie BROCKS, MD Inpatient    Questions for Most Recent Historical Code Status (Order [id number])     Question Answer   By: Other            Home/SNF/Other Home  Chief Complaint SBO (small bowel obstruction) (HCC) [K56.609]  Level of Care/Admitting Diagnosis ED Disposition     ED Disposition  Admit   Condition  --   Comment  Hospital Area: [facility] [[id number]]  Level of Care: Med-Surg [16]  May admit patient to [facility] or [facility] if equivalent level of care is available:: No  Covid Evaluation: Asymptomatic - no recent exposure (last 10 days) testing not required  Diagnosis: SBO (small bowel obstruction) [facility]) [[id number]]  Admitting Physician: DOUTOVA, ANASTASSIA [[id number]]  Attending Physician: DOUTOVA, ANASTASSIA [[id number]]  Certification:: I certify this patient will need inpatient services for at least 2 midnights  Expected Medical Readiness: 08/02/2024          Medical History Past Medical History:  Diagnosis Date   Constipation    Gastric ulcer    Ulcer     Allergies Allergies  Allergen Reactions   Asa [Aspirin] Other (See Comments)    Current and Hx of GI ulcer - per Pt and friend/translator   Nsaids Other (See Comments)    Current and Hx of GI ulcer per pt and friend/translator   Peanut-Containing Drug Products     Pain.      IV Location/Drains/Wounds Patient Lines/Drains/Airways  Status     Active Line/Drains/Airways     Name Placement date Placement time Site Days   Peripheral IV 07/31/24 20 G Right Hand 07/31/24  2025  Hand  less than 1   Incision - 3 Ports Umbilicus Left;Lateral Left;Lateral 02/26/23  1020  -- 521            Labs/Imaging Results for orders placed or performed during the hospital encounter of 07/31/24 (from the past 48 hours)  CBC     Status: None   Collection Time: 07/31/24  8:33 PM  Result Value Ref Range   WBC 8.8 4.0 - 10.5 K/uL   RBC 5.30 4.22 - 5.81 MIL/uL   Hemoglobin 16.3 13.0 - 17.0 g/dL   HCT 49.5 60.9 - 47.9 %   MCV 95.1 80.0 - 100.0 fL   MCH 30.8 26.0 - 34.0 pg   MCHC 32.3 30.0 - 36.0 g/dL   RDW 85.7 88.4 - 84.4 %   Platelets 224 150 - 400 K/uL   nRBC 0.0 0.0 - 0.2 %    Comment: Performed at [facility], [address], [place], [place] [address]  Comprehensive metabolic panel with GFR     Status: Abnormal   Collection Time: 07/31/24  9:34 PM  Result Value Ref Range   Sodium 134 (L) 135 - 145 mmol/L   Potassium 3.9 3.5 - 5.1 mmol/L   Chloride  103 98 - 111 mmol/L   CO2 24 22 - 32 mmol/L   Glucose, Bld 116 (H) 70 - 99 mg/dL    Comment: Glucose reference range applies only to samples taken after fasting for at least 8 hours.   BUN 16 8 - 23 mg/dL   Creatinine, Ser 9.06 0.61 - 1.24 mg/dL   Calcium 8.5 (L) 8.9 - 10.3 mg/dL   Total Protein 7.0 6.5 - 8.1 g/dL   Albumin 3.3 (L) 3.5 - 5.0 g/dL   AST 39 15 - 41 U/L   ALT 65 (H) 0 - 44 U/L   Alkaline Phosphatase 70 38 - 126 U/L   Total Bilirubin 0.5 0.0 - 1.2 mg/dL   GFR, Estimated >39 >39 mL/min    Comment: (NOTE) Calculated using the CKD-EPI Creatinine Equation (2021)    Anion gap 7 5 - 15    Comment: Performed at [facility], [address], [place], [place] [address]  Lipase, blood     Status: None   Collection Time: 07/31/24  9:34 PM  Result Value Ref Range   Lipase 28 11 - 51 U/L    Comment: Performed at [facility], [address], [place], [place] [address]  I-stat chem 8, ED (not at [facility], [facility] or [facility])     Status: Abnormal   Collection Time: 07/31/24  9:36 PM  Result Value Ref Range   Sodium 137 135 - 145 mmol/L   Potassium 3.9 3.5 - 5.1 mmol/L   Chloride 102 98 - 111 mmol/L   BUN 14 8 - 23 mg/dL   Creatinine, Ser 9.19 0.61 - 1.24 mg/dL   Glucose, Bld 887 (H) 70 - 99 mg/dL    Comment: Glucose reference range applies only to samples taken after fasting for at least 8 hours.   Calcium, Ion 1.17 1.15 - 1.40 mmol/L   TCO2 22 22 - 32 mmol/L   Hemoglobin 15.6 13.0 - 17.0 g/dL   HCT 53.9 60.9 - 47.9 %   CT ABDOMEN PELVIS W CONTRAST Result Date: 07/31/2024 CLINICAL DATA:  Abdominal pain since noon today, diarrhea EXAM: CT ABDOMEN AND PELVIS WITH CONTRAST TECHNIQUE: Multidetector CT imaging of the abdomen and pelvis was performed using the standard protocol following bolus administration of intravenous contrast. RADIATION DOSE REDUCTION: This exam was performed according to the departmental dose-optimization program which includes automated exposure control, adjustment of the mA and/or kV according to patient size and/or use of iterative reconstruction technique. CONTRAST:  OMNIPAQUE  IOHEXOL  300 MG/ML  SOLN COMPARISON:  07/25/2024, 07/31/2024 FINDINGS: Lower chest: No acute pleural or parenchymal lung disease. Hepatobiliary: No focal liver abnormality is seen. No gallstones, gallbladder wall thickening, or biliary dilatation. Pancreas: Unremarkable. No pancreatic ductal dilatation or surrounding inflammatory changes. Spleen: Normal in size without focal abnormality. Adrenals/Urinary Tract: Adrenal glands are unremarkable. Kidneys are normal, without renal calculi, focal lesion, or hydronephrosis. Bladder is unremarkable. Stomach/Bowel: Multiple dilated loops of small bowel are identified, measuring up to 3.8 cm in diameter with multiple gas fluid levels, consistent with small-bowel obstruction.  Transition point within the mid to distal jejunum in the right mid abdomen, reference image 53/2. The distal small bowel is decompressed. Minimal gas and stool throughout the colon. Scattered colonic diverticulosis without evidence of acute diverticulitis. Vascular/Lymphatic: Aortic atherosclerosis. No enlarged abdominal or pelvic lymph nodes. Reproductive: Prostate is unremarkable. Other: No free fluid or free intraperitoneal gas. No abdominal wall hernia. Musculoskeletal: No acute or destructive bony abnormalities. Reconstructed images  demonstrate no additional findings. IMPRESSION: 1. Small-bowel obstruction, with transition point in the mid to distal jejunum in the right mid abdomen. 2. Colonic diverticulosis without diverticulitis. 3.  Aortic Atherosclerosis (ICD10-I70.0). Electronically Signed   By: Ozell Daring M.D.   On: 07/31/2024 22:56   DG ABD ACUTE 2+V W 1V CHEST Result Date: 07/31/2024 EXAM: UPRIGHT AND SUPINE XRAY VIEWS OF THE ABDOMEN AND 4 VIEW(S) OF THE CHEST 07/31/2024 08:46:27 PM COMPARISON: 01/08/2023 CLINICAL HISTORY: Ab pain, constipation. Severe lower abdominal pain and vomiting for three days. FINDINGS: LUNGS AND PLEURA: The lungs are clear. No pleural effusion or pneumothorax. HEART AND MEDIASTINUM: Stable cardiomediastinal silhouette. BOWEL: Dilated loops of small bowel with air-fluid levels. CT is recommended to evaluate for bowel obstruction. PERITONEUM AND SOFT TISSUES: No abnormal calcifications. No free air. BONES: No acute fracture. IMPRESSION: 1. Dilated loops of small bowel with air-fluid levels, concerning for bowel obstruction. CT with IV contrast recommended for further evaluation. Electronically signed by: Norman Gatlin MD 07/31/2024 09:02 PM EDT RP Workstation: [id number]    Pending Labs Unresulted Labs (From admission, onward)     Start     Ordered   07/31/24 2003  Urinalysis, Routine w reflex microscopic -Urine, Clean Catch  Once,   URGENT       Question Answer  Comment  Specimen Source Urine, Clean Catch   Release to patient Immediate      07/31/24 2003            Vitals/Pain Today's Vitals   07/31/24 2000 07/31/24 2006 07/31/24 2035 07/31/24 2307  BP:  (!) 174/101  (!) 158/86  Pulse:  76  85  Resp:  20  20  Temp:  98 F (36.7 C)    TempSrc:  Oral    SpO2:  100%  100%  Weight: 97.5 kg     Height: 6' 5.17 (1.96 m)     PainSc: 10-Worst pain ever  10-Worst pain ever     Isolation Precautions No active isolations  Medications Medications  sodium chloride  0.9 % bolus 1,000 mL (1,000 mLs Intravenous New Bag/Given 07/31/24 2047)  HYDROmorphone  (DILAUDID ) injection 1 mg (1 mg Intravenous Given 07/31/24 2035)  famotidine  (PEPCID ) IVPB 20 mg premix (0 mg Intravenous Stopped 07/31/24 2117)  ondansetron  (ZOFRAN ) injection 4 mg (4 mg Intravenous Given 07/31/24 2045)  iohexol  (OMNIPAQUE ) 300 MG/ML solution 100 mL (100 mLs Intravenous Contrast Given 07/31/24 2243)    Mobility walks with person assist

## 2024-07-31 NOTE — Assessment & Plan Note (Signed)
 -   admit for conservative management  - NG tube - NPO - KUB in AM - appreciate General surgery consult in AM

## 2024-07-31 NOTE — Subjective & Objective (Signed)
 Patient with prior history of small bowel obstruction presents with abdominal pain similar to prior episodes last bowel movement was this morning. Symptoms started around noon today. Patient also has history of gastritis Recently had ED workup done at that time was unremarkable but on repeat workup done today showed small bowel obstruction.  Patient does endorse nausea and vomiting.

## 2024-07-31 NOTE — ED Triage Notes (Signed)
 [place] interpreter used to assist with triage. Pt is moaning and crying in triage. Pt via POV c/o generalized abdominal pain since noon today. Pt started retching in triage. Pt had diarrhea earlier today but now feels constipated.

## 2024-07-31 NOTE — ED Notes (Signed)
 Patient transported to CT

## 2024-07-31 NOTE — ED Provider Notes (Signed)
 [place] EMERGENCY DEPARTMENT AT [facility] Provider Note   CSN: [id number] Arrival date & time: 07/31/24  8052     Patient presents with: No chief complaint on file.   Caleb Rivers is a 68 y.o. male history of gastritis, hypertension here presenting with abdominal pain.  Patient was seen here 5 days ago for abdominal pain and had extensive workup and including labs and CT scans.  Patient was found to have gastritis and was prescribed Prevacid  and Carafate .  Patient states that he was doing well until noon today.  He states that he suddenly had abdominal cramps.  He also had a hard bowel movement yesterday was concerned that he may be constipated.  Patient is not currently on narcotics.  He also had some vomiting when he got to the ER.   The history is provided by the patient.  [place] translation service used with [employer] interpreter     Prior to Admission medications   Medication Sig Start Date End Date Taking? Authorizing Provider  acetaminophen  (TYLENOL ) 500 MG tablet Take 1 tablet (500 mg total) by mouth every 6 (six) hours as needed. 06/19/23   Theadore Ozell HERO, MD  amLODipine  (NORVASC ) 5 MG tablet Take 1 tablet (5 mg total) by mouth daily. 02/17/24   Caleen Qualia, MD  lansoprazole  (PREVACID ) 30 MG capsule Take 1 capsule (30 mg total) by mouth daily. 07/25/24 07/25/25  Tonia Chew, MD  ondansetron  (ZOFRAN ) 4 MG tablet Take 1 tablet (4 mg total) by mouth every 6 (six) hours as needed for nausea. 02/16/24   Amin, Sumayya, MD  polyethylene glycol powder (MIRALAX ) 17 GM/SCOOP powder Mix 1 capful (17g) in 4-8 ounces of water twice daily for 3 days, then once daily thereafter. 04/17/24   Beverley Leita LABOR, PA-C  sennosides-docusate sodium  (SENOKOT-S) 8.6-50 MG tablet Take 1 tablet by mouth daily. 03/02/24   Celestia Rosaline SQUIBB, NP  sucralfate  (CARAFATE ) 1 g tablet Take 1 tablet (1 g total) by mouth 4 (four) times daily -  with meals and at bedtime. 07/25/24   Tonia Chew, MD     Allergies: Asa [aspirin], Nsaids, and Peanut-containing drug products    Review of Systems  Gastrointestinal:  Positive for abdominal pain, constipation and vomiting.  All other systems reviewed and are negative.   Updated Vital Signs BP (!) 174/101 (BP Location: Right Arm)   Pulse 76   Temp 98 F (36.7 C) (Oral)   Resp 20   Ht 6' 5.17 (1.96 m)   Wt 97.5 kg   SpO2 100%   BMI 25.39 kg/m   Physical Exam Vitals and nursing note reviewed.  Constitutional:      Appearance: Normal appearance.     Comments: Uncomfortable  HENT:     Head: Normocephalic.     Nose: Nose normal.     Mouth/Throat:     Mouth: Mucous membranes are dry.  Eyes:     Extraocular Movements: Extraocular movements intact.     Pupils: Pupils are equal, round, and reactive to light.  Cardiovascular:     Rate and Rhythm: Normal rate and regular rhythm.     Pulses: Normal pulses.  Pulmonary:     Effort: Pulmonary effort is normal.     Breath sounds: Normal breath sounds.  Abdominal:     Comments: Mild epigastric tenderness  Genitourinary:    Comments: Rectal-no obvious stool impaction.  Exam done with a chaperone Musculoskeletal:        General: Normal range of motion.  Cervical back: Normal range of motion and neck supple.  Skin:    General: Skin is warm.     Capillary Refill: Capillary refill takes less than 2 seconds.  Neurological:     General: No focal deficit present.     Mental Status: He is alert and oriented to person, place, and time.  Psychiatric:        Mood and Affect: Mood normal.        Behavior: Behavior normal.     (all labs ordered are listed, but only abnormal results are displayed) Labs Reviewed  LIPASE, BLOOD  COMPREHENSIVE METABOLIC PANEL WITH GFR  CBC  URINALYSIS, ROUTINE W REFLEX MICROSCOPIC    EKG: None  Radiology: No results found.   Procedures   Medications Ordered in the ED  famotidine  (PEPCID ) IVPB 20 mg premix (20 mg Intravenous New Bag/Given  07/31/24 2047)  sodium chloride  0.9 % bolus 1,000 mL (1,000 mLs Intravenous New Bag/Given 07/31/24 2047)  HYDROmorphone  (DILAUDID ) injection 1 mg (1 mg Intravenous Given 07/31/24 2035)  ondansetron  (ZOFRAN ) injection 4 mg (4 mg Intravenous Given 07/31/24 2045)                                    Medical Decision Making Caleb Rivers is a 68 y.o. male here presenting with abdominal pain and vomiting.  Patient was seen here recently for similar symptoms and had an unremarkable CT abdomen pelvis.  I think pain can be from constipation versus gastroenteritis versus gastric ulcer versus gastritis.  I have low suspicion for small bowel obstruction.  Plan to get CBC CMP and lipase and acute abdominal series.  Will not repeat a CT at this time.  Will give IV fluids and Zofran  and pain medicine and Pepcid .  11:07 PM X-ray showed possible small bowel obstruction.  CT abdomen pelvis showed SBO with transition point in the mid jejunum.  Ordered NG tube.  Patient will be admitted by the medicine service for small bowel obstruction.   Problems Addressed: SBO (small bowel obstruction) (HCC): acute illness or injury  Amount and/or Complexity of Data Reviewed Labs: ordered. Decision-making details documented in ED Course. Radiology: ordered and independent interpretation performed. Decision-making details documented in ED Course.  Risk Prescription drug management.     Final diagnoses:  None    ED Discharge Orders     None          Patt Alm Macho, MD 07/31/24 2310

## 2024-07-31 NOTE — H&P (Signed)
 Caleb Rivers FMW:[id number] DOB: 08-Aug-1956 DOA: 07/31/2024     PCP: Celestia Rosaline SQUIBB, NP      Patient arrived to ER on 07/31/24 at 1947 Referred by Attending Silvester Ales, MD   Patient coming from:    home Lives  With roommates    Chief Complaint: Abdominal pain   HPI: Caleb Rivers is a 68 y.o. male with medical history significant of HTN, GERD, SBO, PUD, exploratory laparotomy    Presented with abdominal pain nausea vomiting Patient with prior history of small bowel obstruction presents with abdominal pain similar to prior episodes last bowel movement was this morning. Symptoms started around noon today. Patient also has history of gastritis Recently had ED workup done at that time was unremarkable but on repeat workup done today showed small bowel obstruction.  Patient does endorse nausea and vomiting.     Denies significant ETOH intake   Does not smoke      Regarding pertinent Chronic problems:       HTN on NOrvasc  unclear if taking    While in ER:     CT scan showed small bowel obstruction ER provider sent message to general surgery team for patient to be seen in a.m.  Lab Orders         CBC         Urinalysis, Routine w reflex microscopic -Urine, Clean Catch         Comprehensive metabolic panel with GFR         Lipase, blood         I-stat chem 8, ED (not at [facility], [facility] or [facility])    KUB -  Dilated loops of small bowel with air-fluid levels, concerning for bowel obstruction. CT with IV contrast recommended for further evaluation.  CTabd/pelvis -  1. Small-bowel obstruction, with transition point in the mid to distal jejunum in the right mid abdomen. 2. Colonic diverticulosis without diverticulitis  Following Medications were ordered in ER: Medications  sodium chloride  0.9 % bolus 1,000 mL (1,000 mLs Intravenous New Bag/Given 07/31/24 2047)  HYDROmorphone  (DILAUDID ) injection 1 mg (1 mg Intravenous Given 07/31/24 2035)  famotidine  (PEPCID ) IVPB  20 mg premix (0 mg Intravenous Stopped 07/31/24 2117)  ondansetron  (ZOFRAN ) injection 4 mg (4 mg Intravenous Given 07/31/24 2045)  iohexol  (OMNIPAQUE ) 300 MG/ML solution 100 mL (100 mLs Intravenous Contrast Given 07/31/24 2243)         ED Triage Vitals  Encounter Vitals Group     BP 07/31/24 2006 (!) 174/101     Girls Systolic BP Percentile --      Girls Diastolic BP Percentile --      Boys Systolic BP Percentile --      Boys Diastolic BP Percentile --      Pulse Rate 07/31/24 2006 76     Resp 07/31/24 2006 20     Temp 07/31/24 2006 98 F (36.7 C)     Temp Source 07/31/24 2006 Oral     SpO2 07/31/24 2006 100 %     Weight 07/31/24 2000 215 lb (97.5 kg)     Height 07/31/24 2000 6' 5.17 (1.96 m)     Head Circumference --      Peak Flow --      Pain Score 07/31/24 2000 10     Pain Loc --      Pain Education --      Exclude from Growth Chart --   UFJK(75)@     _________________________________________ Significant  initial  Findings: Abnormal Labs Reviewed  COMPREHENSIVE METABOLIC PANEL WITH GFR - Abnormal; Notable for the following components:      Result Value   Sodium 134 (*)    Glucose, Bld 116 (*)    Calcium 8.5 (*)    Albumin 3.3 (*)    ALT 65 (*)    All other components within normal limits  I-STAT CHEM 8, ED - Abnormal; Notable for the following components:   Glucose, Bld 112 (*)    All other components within normal limits         ECG: Ordered   The recent clinical data is shown below. Vitals:   07/31/24 2000 07/31/24 2006 07/31/24 2307  BP:  (!) 174/101 (!) 158/86  Pulse:  76 85  Resp:  20 20  Temp:  98 F (36.7 C)   TempSrc:  Oral   SpO2:  100% 100%  Weight: 97.5 kg    Height: 6' 5.17 (1.96 m)       WBC     Component Value Date/Time   WBC 8.8 07/31/2024 2033   LYMPHSABS 2.4 07/25/2024 1440   LYMPHSABS 2.7 03/02/2024 0000   MONOABS 0.9 07/25/2024 1440   EOSABS 0.0 07/25/2024 1440   EOSABS 0.1 03/02/2024 0000   BASOSABS 0.1 07/25/2024 1440    BASOSABS 0.0 03/02/2024 0000       __________________________________________________________ Recent Labs  Lab 07/25/24 1440 07/31/24 2134 07/31/24 2136  NA 131* 134* 137  K 4.5 3.9 3.9  CO2 21* 24  --   GLUCOSE 113* 116* 112*  BUN 9 16 14   CREATININE 1.03 0.93 0.80  CALCIUM 9.5 8.5*  --     Cr   stable,   Lab Results  Component Value Date   CREATININE 0.80 07/31/2024   CREATININE 0.93 07/31/2024   CREATININE 1.03 07/25/2024    Recent Labs  Lab 07/25/24 1440 07/31/24 2134  AST 42* 39  ALT 55* 65*  ALKPHOS 76 70  BILITOT 0.8 0.5  PROT 8.3* 7.0  ALBUMIN 4.0 3.3*   Lab Results  Component Value Date   CALCIUM 8.5 (L) 07/31/2024        Plt: Lab Results  Component Value Date   PLT 224 07/31/2024    Recent Labs  Lab 07/25/24 1440 07/31/24 2033 07/31/24 2136  WBC 7.9 8.8  --   NEUTROABS 4.5  --   --   HGB 16.7 16.3 15.6  HCT 49.9 50.4 46.0  MCV 94.2 95.1  --   PLT 243 224  --     HG/HCT   stable,     Component Value Date/Time   HGB 15.6 07/31/2024 2136   HGB 16.5 03/02/2024 0000   HCT 46.0 07/31/2024 2136   HCT 51.4 (H) 03/02/2024 0000   MCV 95.1 07/31/2024 2033   MCV 95 03/02/2024 0000     Recent Labs  Lab 07/25/24 1440 07/31/24 2134  LIPASE 33 28   No results for input(s): AMMONIA in the last 168 hours.    _______________________________________________ Hospitalist was called for admission for   SBO   The following Work up has been ordered so far:  Orders Placed This Encounter  Procedures   DG ABD ACUTE 2+V W 1V CHEST   CT ABDOMEN PELVIS W CONTRAST   DG Abd Portable 1V-Small Bowel Protocol-Position Verification   CBC   Urinalysis, Routine w reflex microscopic -Urine, Clean Catch   Comprehensive metabolic panel with GFR   Lipase, blood   Diet NPO  time specified   Refer to Sidebar: Small Bowel Obstruction Protocol   Gastric tube   Elevate head of bed   Ensure patient not vomiting prior to administration of Gastrografin . Notify  MD if patient is actively vomiting   Clamp NG tube for 1 hour after administration of Gastrografin  then resume NG tube to low wall intermittent suction   Consult to hospitalist   I-stat chem 8, ED (not at [facility], [facility] or [facility])   Admit to Inpatient (patient's expected length of stay will be greater than 2 midnights or inpatient only procedure)     OTHER Significant initial  Findings:  labs showing:     DM  labs:  HbA1C: No results for input(s): HGBA1C in the last 8760 hours.     CBG (last 3)  No results for input(s): GLUCAP in the last 72 hours.        Cultures:    Component Value Date/Time   SDES URINE, RANDOM 10/17/2011 1126   SPECREQUEST NONE 10/17/2011 1126   CULT NO GROWTH 10/17/2011 1126   REPTSTATUS 10/18/2011 FINAL 10/17/2011 1126     Radiological Exams on Admission: CT ABDOMEN PELVIS W CONTRAST Result Date: 07/31/2024 CLINICAL DATA:  Abdominal pain since noon today, diarrhea EXAM: CT ABDOMEN AND PELVIS WITH CONTRAST TECHNIQUE: Multidetector CT imaging of the abdomen and pelvis was performed using the standard protocol following bolus administration of intravenous contrast. RADIATION DOSE REDUCTION: This exam was performed according to the departmental dose-optimization program which includes automated exposure control, adjustment of the mA and/or kV according to patient size and/or use of iterative reconstruction technique. CONTRAST:  OMNIPAQUE  IOHEXOL  300 MG/ML  SOLN COMPARISON:  07/25/2024, 07/31/2024 FINDINGS: Lower chest: No acute pleural or parenchymal lung disease. Hepatobiliary: No focal liver abnormality is seen. No gallstones, gallbladder wall thickening, or biliary dilatation. Pancreas: Unremarkable. No pancreatic ductal dilatation or surrounding inflammatory changes. Spleen: Normal in size without focal abnormality. Adrenals/Urinary Tract: Adrenal glands are unremarkable. Kidneys are normal, without renal calculi, focal lesion, or hydronephrosis. Bladder is  unremarkable. Stomach/Bowel: Multiple dilated loops of small bowel are identified, measuring up to 3.8 cm in diameter with multiple gas fluid levels, consistent with small-bowel obstruction. Transition point within the mid to distal jejunum in the right mid abdomen, reference image 53/2. The distal small bowel is decompressed. Minimal gas and stool throughout the colon. Scattered colonic diverticulosis without evidence of acute diverticulitis. Vascular/Lymphatic: Aortic atherosclerosis. No enlarged abdominal or pelvic lymph nodes. Reproductive: Prostate is unremarkable. Other: No free fluid or free intraperitoneal gas. No abdominal wall hernia. Musculoskeletal: No acute or destructive bony abnormalities. Reconstructed images demonstrate no additional findings. IMPRESSION: 1. Small-bowel obstruction, with transition point in the mid to distal jejunum in the right mid abdomen. 2. Colonic diverticulosis without diverticulitis. 3.  Aortic Atherosclerosis (ICD10-I70.0). Electronically Signed   By: Ozell Daring M.D.   On: 07/31/2024 22:56   DG ABD ACUTE 2+V W 1V CHEST Result Date: 07/31/2024 EXAM: UPRIGHT AND SUPINE XRAY VIEWS OF THE ABDOMEN AND 4 VIEW(S) OF THE CHEST 07/31/2024 08:46:27 PM COMPARISON: 01/08/2023 CLINICAL HISTORY: Ab pain, constipation. Severe lower abdominal pain and vomiting for three days. FINDINGS: LUNGS AND PLEURA: The lungs are clear. No pleural effusion or pneumothorax. HEART AND MEDIASTINUM: Stable cardiomediastinal silhouette. BOWEL: Dilated loops of small bowel with air-fluid levels. CT is recommended to evaluate for bowel obstruction. PERITONEUM AND SOFT TISSUES: No abnormal calcifications. No free air. BONES: No acute fracture. IMPRESSION: 1. Dilated loops of small bowel with air-fluid levels, concerning  for bowel obstruction. CT with IV contrast recommended for further evaluation. Electronically signed by: Norman Gatlin MD 07/31/2024 09:02 PM EDT RP Workstation: [id number]    _______________________________________________________________________________________________________ Latest  Blood pressure (!) 158/86, pulse 85, temperature 98 F (36.7 C), temperature source Oral, resp. rate 20, height 6' 5.17 (1.96 m), weight 97.5 kg, SpO2 100%.   Vitals  labs and radiology finding personally reviewed  Review of Systems:    Pertinent positives include:  abdominal pain, nausea, vomiting  Constitutional:  No weight loss, night sweats, Fevers, chills, fatigue, weight loss  HEENT:  No headaches, Difficulty swallowing,Tooth/dental problems,Sore throat,  No sneezing, itching, ear ache, nasal congestion, post nasal drip,  Cardio-vascular:  No chest pain, Orthopnea, PND, anasarca, dizziness, palpitations.no Bilateral lower extremity swelling  GI:  No heartburn, indigestion, , diarrhea, change in bowel habits, loss of appetite, melena, blood in stool, hematemesis Resp:  no shortness of breath at rest. No dyspnea on exertion, No excess mucus, no productive cough, No non-productive cough, No coughing up of blood.No change in color of mucus.No wheezing. Skin:  no rash or lesions. No jaundice GU:  no dysuria, change in color of urine, no urgency or frequency. No straining to urinate.  No flank pain.  Musculoskeletal:  No joint pain or no joint swelling. No decreased range of motion. No back pain.  Psych:  No change in mood or affect. No depression or anxiety. No memory loss.  Neuro: no localizing neurological complaints, no tingling, no weakness, no double vision, no gait abnormality, no slurred speech, no confusion  All systems reviewed and apart from HOPI all are negative _______________________________________________________________________________________________ Past Medical History:   Past Medical History:  Diagnosis Date   Constipation    Gastric ulcer    Ulcer      Past Surgical History:  Procedure Laterality Date   LAPAROSCOPY N/A 02/26/2023    Procedure: LAPAROSCOPY DIAGNOSTIC;  Surgeon: Teresa Lonni HERO, MD;  Location: [facility] OR;  Service: General;  Laterality: N/A;   LEFT HEART CATHETERIZATION WITH CORONARY ANGIOGRAM N/A 09/25/2013   Procedure: LEFT HEART CATHETERIZATION WITH CORONARY ANGIOGRAM;  Surgeon: Debby DELENA Sor, MD;  Location: [facility] CATH LAB;  Service: Cardiovascular;  Laterality: N/A;    Social History:  Ambulatory   independently       reports that he has never smoked. He has never used smokeless tobacco. He reports that he does not drink alcohol and does not use drugs.   Family History:   Family History  Problem Relation Age of Onset   Diabetes Mellitus II Neg Hx    Cancer Neg Hx    ______________________________________________________________________________________________ Allergies: Allergies  Allergen Reactions   Asa [Aspirin] Other (See Comments)    Current and Hx of GI ulcer - per Pt and friend/translator   Nsaids Other (See Comments)    Current and Hx of GI ulcer per pt and friend/translator   Peanut-Containing Drug Products     Pain.       Prior to Admission medications   Medication Sig Start Date End Date Taking? Authorizing Provider  acetaminophen  (TYLENOL ) 500 MG tablet Take 1 tablet (500 mg total) by mouth every 6 (six) hours as needed. 06/19/23   Theadore Ozell HERO, MD  amLODipine  (NORVASC ) 5 MG tablet Take 1 tablet (5 mg total) by mouth daily. 02/17/24   Caleen Qualia, MD  lansoprazole  (PREVACID ) 30 MG capsule Take 1 capsule (30 mg total) by mouth daily. 07/25/24 07/25/25  Tonia Chew, MD  ondansetron  (ZOFRAN ) 4 MG tablet Take  1 tablet (4 mg total) by mouth every 6 (six) hours as needed for nausea. 02/16/24   Amin, Sumayya, MD  polyethylene glycol powder (MIRALAX ) 17 GM/SCOOP powder Mix 1 capful (17g) in 4-8 ounces of water twice daily for 3 days, then once daily thereafter. 04/17/24   Beverley Leita LABOR, PA-C  sennosides-docusate sodium  (SENOKOT-S) 8.6-50 MG tablet Take 1 tablet by mouth daily. 03/02/24    Celestia Rosaline SQUIBB, NP  sucralfate  (CARAFATE ) 1 g tablet Take 1 tablet (1 g total) by mouth 4 (four) times daily -  with meals and at bedtime. 07/25/24   Tonia Chew, MD    ___________________________________________________________________________________________________ Physical Exam:    07/31/2024   11:07 PM 07/31/2024    8:06 PM 07/31/2024    8:00 PM  Vitals with BMI  Height   6' 5.165  Weight   215 lbs  BMI   25.39  Systolic 158 174   Diastolic 86 101   Pulse 85 76      1. General:  in No  Acute distress   well   -appearing 2. Psychological: Alert and   Oriented 3. Head/ENT:  Dry Mucous Membranes                          Head Non traumatic, neck supple                          Poor Dentition 4. SKIN: decreased Skin turgor,  Skin clean Dry and intact no rash    5. Heart: Regular rate and rhythm no  Murmur, no Rub or gallop 6. Lungs:  no wheezes or crackles   7. Abdomen: Soft,  tender, Non distended bowel sounds decreased  8. Lower extremities: no clubbing, cyanosis, no  edema 9. Neurologically Grossly intact, moving all 4 extremities equally   10. MSK: Normal range of motion    Chart has been reviewed  ______________________________________________________________________________________________  Assessment/Plan 68 y.o. male with medical history significant of HTN, GERD, SBO, PUD, exploratory laparotomy   Present on Admission:  SBO (small bowel obstruction) (HCC)  Essential hypertension   Essential hypertension Hydralazine  IV as needed  SBO (small bowel obstruction) (HCC)    - admit for conservative management  - NG tube - NPO - KUB in AM - appreciate General surgery consult in AM   Other plan as per orders.  DVT prophylaxis:  SCD     Code Status:    Code Status: Prior FULL CODE   as per patient   I had personally discussed CODE STATUS with patient   ACP   none     Family Communication:   Family not at  Bedside    Diet  Diet Orders (From  admission, onward)     Start     Ordered   07/31/24 2003  Diet NPO time specified  Diet effective now        07/31/24 2003            Disposition Plan:        To home once workup is complete and patient is stable   Following barriers for discharge:  Pain controlled with PO medications                              Able to tolerate p.o.                           Will need consultants to evaluate patient prior to discharge                          Consult Orders  (From admission, onward)           Start     Ordered   07/31/24 2300  Consult to hospitalist  Once       Provider:  (Not yet assigned)  Question Answer Comment  Place call to: [facility]   Reason for Consult Admit      07/31/24 2259                               Consults called: msg sent to  general Surgery   Admission status:  ED Disposition     ED Disposition  Admit   Condition  --   Comment  Hospital Area: [facility] [[id number]]  Level of Care: Med-Surg [[id number]]  May admit patient to [facility] or [facility] if equivalent level of care is available:: No  Covid Evaluation: Asymptomatic - no recent exposure (last 10 days) testing not required  Diagnosis: SBO (small bowel obstruction) [facility]) [[id number]]  Admitting Physician: Onis Markoff [[id number]]  Attending Physician: Lukisha Procida [[id number]]  Certification:: I certify this patient will need inpatient services for at least 2 midnights  Expected Medical Readiness: 08/02/2024           inpatient     I Expect 2 midnight stay secondary to severity of patient's current illness need for inpatient interventions justified by the following:     Severe lab/radiological/exam abnormalities including:    SBO (small bowel obstruction) (HCC)     That are currently affecting medical management.   I expect  patient to be hospitalized for 2 midnights requiring inpatient  medical care.  Patient is at high risk for adverse outcome (such as loss of life or disability) if not treated.  Indication for inpatient stay as follows:    inability to maintain oral hydration    Need for operative/procedural  intervention    Need for IV fluids,, IV pain medications   Level of care     medical floor      Celester Lech 07/31/2024, 11:45 PM    [facility]     after 2 AM please page floor coverage   If 7AM-7PM, please contact the day team taking care of the patient using [email or web address]

## 2024-07-31 NOTE — Assessment & Plan Note (Signed)
 Hydralazine IV as needed

## 2024-08-01 ENCOUNTER — Encounter (HOSPITAL_COMMUNITY): Payer: Self-pay | Admitting: Internal Medicine

## 2024-08-01 ENCOUNTER — Inpatient Hospital Stay (HOSPITAL_COMMUNITY): Payer: Self-pay

## 2024-08-01 LAB — COMPREHENSIVE METABOLIC PANEL WITH GFR
ALT: 59 U/L — ABNORMAL HIGH (ref 0–44)
AST: 35 U/L (ref 15–41)
Albumin: 3.3 g/dL — ABNORMAL LOW (ref 3.5–5.0)
Alkaline Phosphatase: 64 U/L (ref 38–126)
Anion gap: 11 (ref 5–15)
BUN: 15 mg/dL (ref 8–23)
CO2: 22 mmol/L (ref 22–32)
Calcium: 8.7 mg/dL — ABNORMAL LOW (ref 8.9–10.3)
Chloride: 102 mmol/L (ref 98–111)
Creatinine, Ser: 0.91 mg/dL (ref 0.61–1.24)
GFR, Estimated: >60 mL/min (ref >60)
Glucose, Bld: 111 mg/dL — ABNORMAL HIGH (ref 70–99)
Potassium: 4 mmol/L (ref 3.5–5.1)
Sodium: 135 mmol/L (ref 135–145)
Total Bilirubin: 0.7 mg/dL (ref 0.0–1.2)
Total Protein: 6.8 g/dL (ref 6.5–8.1)

## 2024-08-01 LAB — MAGNESIUM: Magnesium: 2 mg/dL (ref 1.7–2.4)

## 2024-08-01 LAB — URINALYSIS, ROUTINE W REFLEX MICROSCOPIC
Bilirubin Urine: NEGATIVE
Glucose, UA: NEGATIVE mg/dL
Hgb urine dipstick: NEGATIVE
Ketones, ur: NEGATIVE mg/dL
Leukocytes,Ua: NEGATIVE
Nitrite: NEGATIVE
Protein, ur: NEGATIVE mg/dL
Specific Gravity, Urine: 1.027 (ref 1.005–1.030)
pH: 7 (ref 5.0–8.0)

## 2024-08-01 LAB — CBC
HCT: 40.8 % (ref 39.0–52.0)
Hemoglobin: 13.5 g/dL (ref 13.0–17.0)
MCH: 31.6 pg (ref 26.0–34.0)
MCHC: 33.1 g/dL (ref 30.0–36.0)
MCV: 95.6 fL (ref 80.0–100.0)
Platelets: 186 K/uL (ref 150–400)
RBC: 4.27 MIL/uL (ref 4.22–5.81)
RDW: 14.4 % (ref 11.5–15.5)
WBC: 8.8 K/uL (ref 4.0–10.5)
nRBC: 0 % (ref 0.0–0.2)

## 2024-08-01 LAB — PHOSPHORUS: Phosphorus: 3.8 mg/dL (ref 2.5–4.6)

## 2024-08-01 LAB — HIV ANTIBODY (ROUTINE TESTING W REFLEX): HIV Screen 4th Generation wRfx: NONREACTIVE

## 2024-08-01 MED ORDER — ONDANSETRON HCL 4 MG/2ML IJ SOLN
4.0000 mg | Freq: Four times a day (QID) | INTRAMUSCULAR | Status: DC | PRN
  Administered 2024-08-01: 4 mg via INTRAVENOUS
  Filled 2024-08-01: qty 2

## 2024-08-01 MED ORDER — ONDANSETRON HCL 4 MG PO TABS
4.0000 mg | ORAL_TABLET | Freq: Four times a day (QID) | ORAL | Status: DC | PRN
Start: 2024-08-01 — End: 2024-08-03

## 2024-08-01 MED ORDER — ACETAMINOPHEN 650 MG RE SUPP
650.0000 mg | Freq: Four times a day (QID) | RECTAL | Status: DC | PRN
Start: 2024-08-01 — End: 2024-08-03

## 2024-08-01 MED ORDER — CARMEX CLASSIC LIP BALM EX OINT
1.0000 | TOPICAL_OINTMENT | CUTANEOUS | Status: DC | PRN
  Administered 2024-08-01: 1 via TOPICAL
  Filled 2024-08-01 (×2): qty 10

## 2024-08-01 MED ORDER — SODIUM CHLORIDE 0.9 % IV SOLN: INTRAVENOUS | Status: DC

## 2024-08-01 MED ORDER — DIATRIZOATE MEGLUMINE & SODIUM 66-10 % PO SOLN
90.0000 mL | Freq: Once | ORAL | Status: AC
  Administered 2024-08-01: 90 mL via NASOGASTRIC
  Filled 2024-08-01: qty 90

## 2024-08-01 MED ORDER — HYDROMORPHONE HCL 1 MG/ML IJ SOLN
0.5000 mg | INTRAMUSCULAR | Status: DC | PRN
  Administered 2024-08-01 (×2): 1 mg via INTRAVENOUS
  Filled 2024-08-01 (×2): qty 1

## 2024-08-01 MED ORDER — ACETAMINOPHEN 160 MG/5ML PO SOLN: 650.0000 mg | Freq: Four times a day (QID) | ORAL | Status: DC | PRN

## 2024-08-01 NOTE — Progress Notes (Signed)
SATURATION QUALIFICATIONS: (This note is used to comply with regulatory documentation for home oxygen)  Patient Saturations on Room Air at Rest = 100%  Patient Saturations on Room Air while Ambulating = 100%  Please briefly explain why patient needs home oxygen:

## 2024-08-01 NOTE — Assessment & Plan Note (Signed)
-   History of SBO February 2024 and underwent diagnostic laparoscopy on 02/26/2023; no obvious obstruction or adhesions when seen at that time -Underwent NG tube placement on admission -This morning he is reporting having some flatus but no bowel movement -General Surgery following, appreciate assistance.  Small bowel follow-through being planned - Contrast noted in colon on small bowel follow-through.  NG tube was clamped and he tolerated well along with diet advancement.

## 2024-08-01 NOTE — Consult Note (Signed)
 Caleb Rivers 1956/07/16  [id number].    Requesting MD: Dr. Alm Apo Chief Complaint/Reason for Consult: sbo  HPI:  This is a 68 yo male (speaks [place] and [place] interpreter used for our conversation), who has been admitted previous for SBOs and even underwent a dx lap 02/26/23 by Dr. Teresa for SBO and presumed mass.  This was negative for SBO or mass.  He has been admitted one other time for a SBO, but resolved on its own.    He was doing well until yesterday when he developed abdominal pain, N/V.  He has constipation at baseline.  He has continued to have flatus, but due to his significant pain and vomiting, he came to the [facility] last night.  He has a CT scan that reveals a SBO with a transition in the jejunum.  His labs are overall unremarkable.  He has been admitted and a NGT has been placed.  His pain has resolved.  We have been asked to see him.  ROS: ROS: see HPI  Family History  Problem Relation Age of Onset   Diabetes Mellitus II Neg Hx    Cancer Neg Hx     Past Medical History:  Diagnosis Date   Constipation    Gastric ulcer    Ulcer     Past Surgical History:  Procedure Laterality Date   LAPAROSCOPY N/A 02/26/2023   Procedure: LAPAROSCOPY DIAGNOSTIC;  Surgeon: Teresa Lonni HERO, MD;  Location: [facility] OR;  Service: General;  Laterality: N/A;   LEFT HEART CATHETERIZATION WITH CORONARY ANGIOGRAM N/A 09/25/2013   Procedure: LEFT HEART CATHETERIZATION WITH CORONARY ANGIOGRAM;  Surgeon: Debby DELENA Sor, MD;  Location: [facility] CATH LAB;  Service: Cardiovascular;  Laterality: N/A;    Social History:  reports that he has never smoked. He has never used smokeless tobacco. He reports that he does not drink alcohol and does not use drugs.  Allergies:  Allergies  Allergen Reactions   Asa [Aspirin] Other (See Comments)    Current and Hx of GI ulcer - per Pt and friend/translator   Nsaids Other (See Comments)    Current and Hx of GI ulcer per pt and friend/translator    Peanut-Containing Drug Products     Pain.      Medications Prior to Admission  Medication Sig Dispense Refill   acetaminophen  (TYLENOL ) 500 MG tablet Take 1 tablet (500 mg total) by mouth every 6 (six) hours as needed. (Patient taking differently: Take 500 mg by mouth every 6 (six) hours as needed for mild pain (pain score 1-3) or moderate pain (pain score 4-6).) 30 tablet 0   amLODipine  (NORVASC ) 5 MG tablet Take 1 tablet (5 mg total) by mouth daily. 90 tablet 1   lansoprazole  (PREVACID ) 30 MG capsule Take 1 capsule (30 mg total) by mouth daily. 30 capsule 1   ondansetron  (ZOFRAN ) 4 MG tablet Take 1 tablet (4 mg total) by mouth every 6 (six) hours as needed for nausea. 20 tablet 0   polyethylene glycol powder (MIRALAX ) 17 GM/SCOOP powder Mix 1 capful (17g) in 4-8 ounces of water twice daily for 3 days, then once daily thereafter. 238 g 0   sennosides-docusate sodium  (SENOKOT-S) 8.6-50 MG tablet Take 1 tablet by mouth daily. 90 tablet 1   sucralfate  (CARAFATE ) 1 g tablet Take 1 tablet (1 g total) by mouth 4 (four) times daily -  with meals and at bedtime. 60 tablet 0     Physical Exam: Blood pressure (!) 145/81, pulse  76, temperature 98.2 F (36.8 C), temperature source Oral, resp. rate 14, height 6' 5.17 (1.96 m), weight 97.5 kg, SpO2 100%. General: pleasant, WD, WN black male who is laying in bed in NAD HEENT: head is normocephalic, atraumatic.  Sclera are noninjected.  PERRL.  Ears and nose without any masses or lesions, but NGT in place with minimal output, not bilious.  Mouth is pink and dry Heart: regular, rate, and rhythm.  Normal s1,s2. No obvious murmurs, gallops, or rubs noted.  Palpable radial and pedal pulses bilaterally Lungs: CTAB, no wheezes, rhonchi, or rales noted.  Respiratory effort nonlabored Abd: soft, NT, ND, +BS, no masses, hernias, or organomegaly, NGT with minimal non-bilious output Psych: A&Ox3 with an appropriate affect.   Results for orders placed or performed  during the hospital encounter of 07/31/24 (from the past 48 hours)  Urinalysis, Routine w reflex microscopic -Urine, Clean Catch     Status: None   Collection Time: 07/31/24  7:00 AM  Result Value Ref Range   Color, Urine YELLOW YELLOW   APPearance CLEAR CLEAR   Specific Gravity, Urine 1.027 1.005 - 1.030   pH 7.0 5.0 - 8.0   Glucose, UA NEGATIVE NEGATIVE mg/dL   Hgb urine dipstick NEGATIVE NEGATIVE   Bilirubin Urine NEGATIVE NEGATIVE   Ketones, ur NEGATIVE NEGATIVE mg/dL   Protein, ur NEGATIVE NEGATIVE mg/dL   Nitrite NEGATIVE NEGATIVE   Leukocytes,Ua NEGATIVE NEGATIVE    Comment: Performed at [facility], [address] [place], [place] [address]  CBC     Status: None   Collection Time: 07/31/24  8:33 PM  Result Value Ref Range   WBC 8.8 4.0 - 10.5 K/uL   RBC 5.30 4.22 - 5.81 MIL/uL   Hemoglobin 16.3 13.0 - 17.0 g/dL   HCT 49.5 60.9 - 47.9 %   MCV 95.1 80.0 - 100.0 fL   MCH 30.8 26.0 - 34.0 pg   MCHC 32.3 30.0 - 36.0 g/dL   RDW 85.7 88.4 - 84.4 %   Platelets 224 150 - 400 K/uL   nRBC 0.0 0.0 - 0.2 %    Comment: Performed at [facility], [address] [place], [place] [address]  Comprehensive metabolic panel with GFR     Status: Abnormal   Collection Time: 07/31/24  9:34 PM  Result Value Ref Range   Sodium 134 (L) 135 - 145 mmol/L   Potassium 3.9 3.5 - 5.1 mmol/L   Chloride 103 98 - 111 mmol/L   CO2 24 22 - 32 mmol/L   Glucose, Bld 116 (H) 70 - 99 mg/dL    Comment: Glucose reference range applies only to samples taken after fasting for at least 8 hours.   BUN 16 8 - 23 mg/dL   Creatinine, Ser 9.06 0.61 - 1.24 mg/dL   Calcium 8.5 (L) 8.9 - 10.3 mg/dL   Total Protein 7.0 6.5 - 8.1 g/dL   Albumin 3.3 (L) 3.5 - 5.0 g/dL   AST 39 15 - 41 U/L   ALT 65 (H) 0 - 44 U/L   Alkaline Phosphatase 70 38 - 126 U/L   Total Bilirubin 0.5 0.0 - 1.2 mg/dL   GFR, Estimated >39 >39 mL/min    Comment: (NOTE) Calculated using the CKD-EPI  Creatinine Equation (2021)    Anion gap 7 5 - 15    Comment: Performed at [facility], [address], [place], [place] [address]  Lipase, blood     Status: None  Collection Time: 07/31/24  9:34 PM  Result Value Ref Range   Lipase 28 11 - 51 U/L    Comment: Performed at [facility], [address] [place], [place] [address]  I-stat chem 8, ED (not at [facility], [facility] or [facility])     Status: Abnormal   Collection Time: 07/31/24  9:36 PM  Result Value Ref Range   Sodium 137 135 - 145 mmol/L   Potassium 3.9 3.5 - 5.1 mmol/L   Chloride 102 98 - 111 mmol/L   BUN 14 8 - 23 mg/dL   Creatinine, Ser 9.19 0.61 - 1.24 mg/dL   Glucose, Bld 887 (H) 70 - 99 mg/dL    Comment: Glucose reference range applies only to samples taken after fasting for at least 8 hours.   Calcium, Ion 1.17 1.15 - 1.40 mmol/L   TCO2 22 22 - 32 mmol/L   Hemoglobin 15.6 13.0 - 17.0 g/dL   HCT 53.9 60.9 - 47.9 %  HIV Antibody (routine testing w rflx)     Status: None   Collection Time: 08/01/24  4:26 AM  Result Value Ref Range   HIV Screen 4th Generation wRfx Non Reactive Non Reactive    Comment: Performed at [facility], [address] [place], [place] [address]  CBC     Status: None   Collection Time: 08/01/24  4:26 AM  Result Value Ref Range   WBC 8.8 4.0 - 10.5 K/uL   RBC 4.27 4.22 - 5.81 MIL/uL   Hemoglobin 13.5 13.0 - 17.0 g/dL   HCT 59.1 60.9 - 47.9 %   MCV 95.6 80.0 - 100.0 fL   MCH 31.6 26.0 - 34.0 pg   MCHC 33.1 30.0 - 36.0 g/dL   RDW 85.5 88.4 - 84.4 %   Platelets 186 150 - 400 K/uL   nRBC 0.0 0.0 - 0.2 %    Comment: Performed at [facility], [address] [place], [place] [address]  Comprehensive metabolic panel with GFR     Status: Abnormal   Collection Time: 08/01/24  5:40 AM  Result Value Ref Range   Sodium 135 135 - 145 mmol/L   Potassium 4.0 3.5 - 5.1 mmol/L   Chloride 102 98 - 111 mmol/L   CO2 22 22 - 32 mmol/L   Glucose, Bld 111  (H) 70 - 99 mg/dL    Comment: Glucose reference range applies only to samples taken after fasting for at least 8 hours.   BUN 15 8 - 23 mg/dL   Creatinine, Ser 9.08 0.61 - 1.24 mg/dL   Calcium 8.7 (L) 8.9 - 10.3 mg/dL   Total Protein 6.8 6.5 - 8.1 g/dL   Albumin 3.3 (L) 3.5 - 5.0 g/dL   AST 35 15 - 41 U/L   ALT 59 (H) 0 - 44 U/L   Alkaline Phosphatase 64 38 - 126 U/L   Total Bilirubin 0.7 0.0 - 1.2 mg/dL   GFR, Estimated >39 >39 mL/min    Comment: (NOTE) Calculated using the CKD-EPI Creatinine Equation (2021)    Anion gap 11 5 - 15    Comment: Performed at [facility], [address] [place], [place] [address]  Magnesium     Status: None   Collection Time: 08/01/24  5:40 AM  Result Value Ref Range   Magnesium 2.0 1.7 - 2.4 mg/dL    Comment: Performed at [facility], [address], [place], [place] [address]  Phosphorus  Status: None   Collection Time: 08/01/24  5:40 AM  Result Value Ref Range   Phosphorus 3.8 2.5 - 4.6 mg/dL    Comment: Performed at [facility], [address], [place], [place] [address]   DG Abd 1 View Result Date: 08/01/2024 CLINICAL DATA:  Nasogastric tube placement. EXAM: ABDOMEN - 1 VIEW COMPARISON:  July 31, 2024 FINDINGS: An enteric tube is seen and is unchanged in position when compared to the prior study. The bowel gas pattern is normal. Radiopaque contrast is seen within the bilateral renal collecting systems. No radio-opaque calculi or other significant radiographic abnormality are seen. IMPRESSION: Stable enteric tube positioning, as described above. Electronically Signed   By: Suzen Dials M.D.   On: 08/01/2024 00:41   DG Abd Portable 1V-Small Bowel Protocol-Position Verification Result Date: 07/31/2024 CLINICAL DATA:  Nasogastric tube placement. EXAM: PORTABLE ABDOMEN - 1 VIEW COMPARISON:  None Available. FINDINGS: A nasogastric tube is seen with its distal tip overlying the body of the  stomach. Its distal side hole sits approximally 2.5 cm distal to the expected region of the gastroesophageal junction. The bowel gas pattern is normal. No radio-opaque calculi or other significant radiographic abnormality are seen. IMPRESSION: Nasogastric tube positioning, as described above. Electronically Signed   By: Suzen Dials M.D.   On: 07/31/2024 23:46   CT ABDOMEN PELVIS W CONTRAST Result Date: 07/31/2024 CLINICAL DATA:  Abdominal pain since noon today, diarrhea EXAM: CT ABDOMEN AND PELVIS WITH CONTRAST TECHNIQUE: Multidetector CT imaging of the abdomen and pelvis was performed using the standard protocol following bolus administration of intravenous contrast. RADIATION DOSE REDUCTION: This exam was performed according to the departmental dose-optimization program which includes automated exposure control, adjustment of the mA and/or kV according to patient size and/or use of iterative reconstruction technique. CONTRAST:  OMNIPAQUE  IOHEXOL  300 MG/ML  SOLN COMPARISON:  07/25/2024, 07/31/2024 FINDINGS: Lower chest: No acute pleural or parenchymal lung disease. Hepatobiliary: No focal liver abnormality is seen. No gallstones, gallbladder wall thickening, or biliary dilatation. Pancreas: Unremarkable. No pancreatic ductal dilatation or surrounding inflammatory changes. Spleen: Normal in size without focal abnormality. Adrenals/Urinary Tract: Adrenal glands are unremarkable. Kidneys are normal, without renal calculi, focal lesion, or hydronephrosis. Bladder is unremarkable. Stomach/Bowel: Multiple dilated loops of small bowel are identified, measuring up to 3.8 cm in diameter with multiple gas fluid levels, consistent with small-bowel obstruction. Transition point within the mid to distal jejunum in the right mid abdomen, reference image 53/2. The distal small bowel is decompressed. Minimal gas and stool throughout the colon. Scattered colonic diverticulosis without evidence of acute diverticulitis.  Vascular/Lymphatic: Aortic atherosclerosis. No enlarged abdominal or pelvic lymph nodes. Reproductive: Prostate is unremarkable. Other: No free fluid or free intraperitoneal gas. No abdominal wall hernia. Musculoskeletal: No acute or destructive bony abnormalities. Reconstructed images demonstrate no additional findings. IMPRESSION: 1. Small-bowel obstruction, with transition point in the mid to distal jejunum in the right mid abdomen. 2. Colonic diverticulosis without diverticulitis. 3.  Aortic Atherosclerosis (ICD10-I70.0). Electronically Signed   By: Ozell Daring M.D.   On: 07/31/2024 22:56   DG ABD ACUTE 2+V W 1V CHEST Result Date: 07/31/2024 EXAM: UPRIGHT AND SUPINE XRAY VIEWS OF THE ABDOMEN AND 4 VIEW(S) OF THE CHEST 07/31/2024 08:46:27 PM COMPARISON: 01/08/2023 CLINICAL HISTORY: Ab pain, constipation. Severe lower abdominal pain and vomiting for three days. FINDINGS: LUNGS AND PLEURA: The lungs are clear. No pleural effusion or pneumothorax. HEART AND MEDIASTINUM: Stable cardiomediastinal silhouette. BOWEL: Dilated loops of small bowel with  air-fluid levels. CT is recommended to evaluate for bowel obstruction. PERITONEUM AND SOFT TISSUES: No abnormal calcifications. No free air. BONES: No acute fracture. IMPRESSION: 1. Dilated loops of small bowel with air-fluid levels, concerning for bowel obstruction. CT with IV contrast recommended for further evaluation. Electronically signed by: Norman Gatlin MD 07/31/2024 09:02 PM EDT RP Workstation: [id number]      Assessment/Plan SBO The patient has been seen, examined, labs, vitals, chart, and imaging personally reviewed.  The patient has had several findings concerning for a SBO in the past as well as currently.  He generally resolves on his own.  He is already feeling better with flatus.  We will star the SBO protocol while his NGT is in place and see if this will again resolved his symptoms.  This was discussed with his via a [place] interpreter.  All  questions were answered, and he understood the plan.   FEN - NPO/NGT/IVFs VTE - ok for prophylaxis ID - none   I reviewed ED provider notes, hospitalist notes, last 24 h vitals and pain scores, last 24 h labs and trends, and last 24 h imaging results.  Burnard FORBES Banter, [facility] 08/01/2024, 10:04 AM Please see Amion for pager number during day hours 7:00am-4:30pm or 7:00am -11:30am on weekends

## 2024-08-01 NOTE — Hospital Course (Addendum)
 Mr. Stratmann is a 68 yo male with PMH SBO s/p diagnostic laparoscopy 02/26/23, gastric ulcer, HTN, GERD who presented with abdominal pain and nausea/vomiting. CT abdomen/pelvis showed SBO with transition point in the mid to distal jejunum in the right mid abdomen. He underwent NG tube placement and was placed on bowel rest and admitted for general surgery evaluation. He had rapid improvement and NG tube was able to be discontinued after clamping and diet trial. Diet was advanced and he tolerated well prior to discharge home.

## 2024-08-01 NOTE — Progress Notes (Signed)
 Progress Note    Caleb Rivers   FMW:[id number]  DOB: 1956/05/13  DOA: 07/31/2024     1 PCP: Celestia Rosaline SQUIBB, NP  Initial CC: Nausea/vomiting, abdominal pain  Hospital Course: Caleb Rivers is a 68 yo male with PMH SBO s/p diagnostic laparoscopy 02/26/23, gastric ulcer, HTN, GERD who presented with abdominal pain and nausea/vomiting. CT abdomen/pelvis showed SBO with transition point in the mid to distal jejunum in the right mid abdomen. He underwent NG tube placement and was placed on bowel rest and admitted for general surgery evaluation.  Interval History:  Comfortable this morning.  Feels better after NG tube placement.  States he is already having flatus, no bowel movement. [place] interpreter used but there still seemed to be some difficulty with him understanding some of the questions.  For example he did not think he has had a history of SBO despite having had surgery last February. Regardless, he seems to be feeling better this morning.  Assessment and Plan: * SBO (small bowel obstruction) (HCC) - History of SBO February 2024 and underwent diagnostic laparoscopy on 02/26/2023; no obvious obstruction or adhesions when seen at that time -Underwent NG tube placement on admission -This morning he is reporting having some flatus but no bowel movement -General Surgery following, appreciate assistance.  Small bowel follow-through being planned  Essential hypertension - Home meds on hold while n.p.o.  History of peptic ulcer disease - Continue PPI    Old records reviewed in assessment of this patient  Antimicrobials:   DVT prophylaxis:  SCDs Start: 08/01/24 0036   Code Status:   Code Status: Full Code  Mobility Assessment (Last 72 Hours)     Mobility Assessment     Row Name 08/01/24 0830 08/01/24 0105         Does the patient have exclusion criteria? No - Perform mobility assessment No - Perform mobility assessment      What is the highest level of mobility  based on the mobility assessment? Level 5 (Ambulates independently) - Balance while walking independently - Complete Level 5 (Ambulates independently) - Balance while walking independently - Complete         Barriers to discharge: None Disposition Plan: Home HH orders placed: N/A Status is: Inpatient  Objective: Blood pressure (!) 145/81, pulse 76, temperature 98.2 F (36.8 C), temperature source Oral, resp. rate 14, height 6' 5.17 (1.96 m), weight 97.5 kg, SpO2 100%.  Examination:  Physical Exam Constitutional:      General: He is not in acute distress.    Appearance: Normal appearance.  HENT:     Head: Normocephalic and atraumatic.     Comments: NG tube in place    Mouth/Throat:     Mouth: Mucous membranes are moist.  Eyes:     Extraocular Movements: Extraocular movements intact.  Cardiovascular:     Rate and Rhythm: Normal rate and regular rhythm.  Pulmonary:     Effort: Pulmonary effort is normal. No respiratory distress.     Breath sounds: Normal breath sounds. No wheezing.  Abdominal:     General: Bowel sounds are decreased. There is no distension.     Palpations: Abdomen is soft.     Tenderness: There is no abdominal tenderness.  Musculoskeletal:        General: Normal range of motion.     Cervical back: Normal range of motion and neck supple.  Skin:    General: Skin is warm and dry.  Neurological:     General: No  focal deficit present.     Mental Status: He is alert.  Psychiatric:        Mood and Affect: Mood normal.        Behavior: Behavior normal.      Consultants:  General Surgery  Procedures:    Data Reviewed: Results for orders placed or performed during the hospital encounter of 07/31/24 (from the past 24 hours)  CBC     Status: None   Collection Time: 07/31/24  8:33 PM  Result Value Ref Range   WBC 8.8 4.0 - 10.5 K/uL   RBC 5.30 4.22 - 5.81 MIL/uL   Hemoglobin 16.3 13.0 - 17.0 g/dL   HCT 49.5 60.9 - 47.9 %   MCV 95.1 80.0 - 100.0 fL    MCH 30.8 26.0 - 34.0 pg   MCHC 32.3 30.0 - 36.0 g/dL   RDW 85.7 88.4 - 84.4 %   Platelets 224 150 - 400 K/uL   nRBC 0.0 0.0 - 0.2 %  Comprehensive metabolic panel with GFR     Status: Abnormal   Collection Time: 07/31/24  9:34 PM  Result Value Ref Range   Sodium 134 (L) 135 - 145 mmol/L   Potassium 3.9 3.5 - 5.1 mmol/L   Chloride 103 98 - 111 mmol/L   CO2 24 22 - 32 mmol/L   Glucose, Bld 116 (H) 70 - 99 mg/dL   BUN 16 8 - 23 mg/dL   Creatinine, Ser 9.06 0.61 - 1.24 mg/dL   Calcium 8.5 (L) 8.9 - 10.3 mg/dL   Total Protein 7.0 6.5 - 8.1 g/dL   Albumin 3.3 (L) 3.5 - 5.0 g/dL   AST 39 15 - 41 U/L   ALT 65 (H) 0 - 44 U/L   Alkaline Phosphatase 70 38 - 126 U/L   Total Bilirubin 0.5 0.0 - 1.2 mg/dL   GFR, Estimated >39 >39 mL/min   Anion gap 7 5 - 15  Lipase, blood     Status: None   Collection Time: 07/31/24  9:34 PM  Result Value Ref Range   Lipase 28 11 - 51 U/L  I-stat chem 8, ED (not at [facility], [facility] or [facility])     Status: Abnormal   Collection Time: 07/31/24  9:36 PM  Result Value Ref Range   Sodium 137 135 - 145 mmol/L   Potassium 3.9 3.5 - 5.1 mmol/L   Chloride 102 98 - 111 mmol/L   BUN 14 8 - 23 mg/dL   Creatinine, Ser 9.19 0.61 - 1.24 mg/dL   Glucose, Bld 887 (H) 70 - 99 mg/dL   Calcium, Ion 8.82 8.84 - 1.40 mmol/L   TCO2 22 22 - 32 mmol/L   Hemoglobin 15.6 13.0 - 17.0 g/dL   HCT 53.9 60.9 - 47.9 %  HIV Antibody (routine testing w rflx)     Status: None   Collection Time: 08/01/24  4:26 AM  Result Value Ref Range   HIV Screen 4th Generation wRfx Non Reactive Non Reactive  CBC     Status: None   Collection Time: 08/01/24  4:26 AM  Result Value Ref Range   WBC 8.8 4.0 - 10.5 K/uL   RBC 4.27 4.22 - 5.81 MIL/uL   Hemoglobin 13.5 13.0 - 17.0 g/dL   HCT 59.1 60.9 - 47.9 %   MCV 95.6 80.0 - 100.0 fL   MCH 31.6 26.0 - 34.0 pg   MCHC 33.1 30.0 - 36.0 g/dL   RDW 85.5 88.4 - 84.4 %  Platelets 186 150 - 400 K/uL   nRBC 0.0 0.0 - 0.2 %  Comprehensive metabolic panel with GFR      Status: Abnormal   Collection Time: 08/01/24  5:40 AM  Result Value Ref Range   Sodium 135 135 - 145 mmol/L   Potassium 4.0 3.5 - 5.1 mmol/L   Chloride 102 98 - 111 mmol/L   CO2 22 22 - 32 mmol/L   Glucose, Bld 111 (H) 70 - 99 mg/dL   BUN 15 8 - 23 mg/dL   Creatinine, Ser 9.08 0.61 - 1.24 mg/dL   Calcium 8.7 (L) 8.9 - 10.3 mg/dL   Total Protein 6.8 6.5 - 8.1 g/dL   Albumin 3.3 (L) 3.5 - 5.0 g/dL   AST 35 15 - 41 U/L   ALT 59 (H) 0 - 44 U/L   Alkaline Phosphatase 64 38 - 126 U/L   Total Bilirubin 0.7 0.0 - 1.2 mg/dL   GFR, Estimated >39 >39 mL/min   Anion gap 11 5 - 15  Magnesium     Status: None   Collection Time: 08/01/24  5:40 AM  Result Value Ref Range   Magnesium 2.0 1.7 - 2.4 mg/dL  Phosphorus     Status: None   Collection Time: 08/01/24  5:40 AM  Result Value Ref Range   Phosphorus 3.8 2.5 - 4.6 mg/dL    I have reviewed pertinent nursing notes, vitals, labs, and images as necessary. I have ordered labwork to follow up on as indicated.  I have reviewed the last notes from staff over past 24 hours. I have discussed patient's care plan and test results with nursing staff, CM/SW, and other staff as appropriate.  Time spent: Greater than 50% of the 55 minute visit was spent in counseling/coordination of care for the patient as laid out in the A&P.   LOS: 1 day   Alm Apo, MD [facility] 08/01/2024, 12:25 PM

## 2024-08-01 NOTE — Plan of Care (Signed)
 Discussed with patient plan of care for shift, call bell, pain management and admission questions with interpreter over the phone.  Patient speaks french and has a Peanut allergy.     Problem: Education: Goal: Knowledge of General Education information will improve Description: Including pain rating scale, medication(s)/side effects and non-pharmacologic comfort measures Outcome: Not Progressing   Problem: Pain Managment: Goal: General experience of comfort will improve and/or be controlled Outcome: Not Progressing   Problem: Health Behavior/Discharge Planning: Goal: Ability to manage health-related needs will improve Outcome: Not Progressing

## 2024-08-01 NOTE — Assessment & Plan Note (Signed)
-   Resume home regimen

## 2024-08-01 NOTE — Assessment & Plan Note (Signed)
 Continue PPI.

## 2024-08-01 NOTE — TOC Initial Note (Signed)
 Transition of Care [facility]) - Initial/Assessment Note    Patient Details  Name: Caleb Rivers MRN: [id number] Date of Birth: 07/17/1956  Transition of Care [facility]) CM/SW Contact:    Doneta Glenys DASEN, RN Phone Number: 08/01/2024, 9:45 AM  Clinical Narrative:                 Presented for abdominal pain. CM and Dr. Patsy spoke with patient in the room using language line interpretor [place] ID # [id number]. Patient stated PTA lives in a apartment with friend Diouf,Fallou [phone]; Veified PCP/insurance; Denies DME, HH, oxygen,or SDOH needs; Will drive self home at discharge.  CM will follow progression to discharge.  Expected Discharge Plan: Home/Self Care Barriers to Discharge: Continued Medical Work up   Patient Goals and CMS Choice Patient states their goals for this hospitalization and ongoing recovery are:: Home CMS Medicare.gov Compare Post Acute Care list provided to::  (NA) Choice offered to / list presented to : NA [place] ownership interest in [facility].provided to:: Parent NA    Expected Discharge Plan and Services In-house Referral: NA Discharge Planning Services: CM Consult Post Acute Care Choice: NA Living arrangements for the past 2 months: Apartment                 DME Arranged: N/A DME Agency: NA       HH Arranged: NA HH Agency: NA        Prior Living Arrangements/Services Living arrangements for the past 2 months: Apartment Lives with:: Roommate Patient language and need for interpreter reviewed:: Yes ([place]) Do you feel safe going back to the place where you live?: Yes      Need for Family Participation in Patient Care: No (Comment) Care giver support system in place?: Yes (comment) Current home services:  (NA) Criminal Activity/Legal Involvement Pertinent to Current Situation/Hospitalization: No - Comment as needed  Activities of Daily Living   ADL Screening (condition at time of admission) Independently performs ADLs?: Yes  (appropriate for developmental age) Is the patient deaf or have difficulty hearing?: No Does the patient have difficulty seeing, even when wearing glasses/contacts?: Yes Does the patient have difficulty concentrating, remembering, or making decisions?: No  Permission Sought/Granted Permission sought to share information with : Case Manager Permission granted to share information with : Yes, Verbal Permission Granted  Share Information with NAME: Diouf,Fallou (Friend)  [phone]           Emotional Assessment Appearance:: Appears stated age Attitude/Demeanor/Rapport: Engaged Affect (typically observed): Appropriate Orientation: : Oriented to Self, Oriented to Place, Oriented to  Time, Oriented to Situation Alcohol / Substance Use: Not Applicable Psych Involvement: No (comment)  Admission diagnosis:  SBO (small bowel obstruction) (HCC) [K56.609] Patient Active Problem List   Diagnosis Date Noted   Small bowel obstruction (HCC) 02/14/2024   Transaminitis 02/14/2024   Diverticulosis 07/19/2023   Essential hypertension 07/18/2023   GERD (gastroesophageal reflux disease) 07/18/2023   History of peptic ulcer disease 09/05/2019   SBO (small bowel obstruction) (HCC) 08/30/2014   Abdominal pain, generalized 08/30/2014   Abnormal resting ECG findings - repolarization changes; NOT STEMI 09/26/2013   Gastritis 09/25/2013   PCP:  Celestia Rosaline SQUIBB, NP Pharmacy:   [facility] [address] [place] [address] Phone: [phone] Fax: [phone]  [facility] [address] [place] [address] Phone: [phone] Fax: [phone]  [facility] [address] [place], [place] - [address] [place] [address] Phone: [phone] Fax: [phone]     Social Drivers of Health (SDOH) Social History: SDOH Screenings   Food Insecurity: No Food  Insecurity (08/01/2024)  Housing: Low Risk  (08/01/2024)  Transportation Needs: No Transportation Needs (08/01/2024)  Utilities: Not At Risk (08/01/2024)  Depression (PHQ2-9): Low Risk  (03/02/2024)  Financial Resource Strain: Medium Risk (09/05/2019)  Physical Activity: Inactive (09/05/2019)  Social Connections: Unknown (08/01/2024)  Stress: No Stress Concern Present (09/05/2019)  Tobacco Use: Low Risk  (07/31/2024)   SDOH Interventions:     Readmission Risk Interventions    08/01/2024    9:42 AM  Readmission Risk Prevention Plan  Post Dischage Appt Complete  Medication Screening Complete  Transportation Screening Complete

## 2024-08-01 NOTE — Plan of Care (Signed)

## 2024-08-02 NOTE — Plan of Care (Signed)

## 2024-08-02 NOTE — TOC Transition Note (Signed)
 Transition of Care [facility]) - Discharge Note   Patient Details  Name: Caleb Rivers MRN: [id number] Date of Birth: [birth year]  [facility]) CM/SW Contact:  Doneta Glenys DASEN, RN Phone Number: 08/02/2024, 3:24 PM   Clinical Narrative:    No CM needs required for discharge.   Final next level of care: Home/Self Care Barriers to Discharge: Barriers Resolved   Patient Goals and CMS Choice Patient states their goals for this hospitalization and ongoing recovery are:: Home CMS Medicare.gov Compare Post Acute Care list provided to::  (NA) Choice offered to / list presented to : NA [place] ownership interest in [facility].provided to:: Parent NA    Discharge Placement                       Discharge Plan and Services Additional resources added to the After Visit Summary for   In-house Referral: NA Discharge Planning Services: CM Consult Post Acute Care Choice: NA          DME Arranged: N/A DME Agency: NA       HH Arranged: NA HH Agency: NA        Social Drivers of Health (SDOH) Interventions SDOH Screenings   Food Insecurity: No Food Insecurity (08/01/2024)  Housing: Low Risk  (08/01/2024)  Transportation Needs: No Transportation Needs (08/01/2024)  Utilities: Not At Risk (08/01/2024)  Depression (PHQ2-9): Low Risk  (03/02/2024)  Financial Resource Strain: Medium Risk (09/05/2019)  Physical Activity: Inactive (09/05/2019)  Social Connections: Unknown (08/01/2024)  Stress: No Stress Concern Present (09/05/2019)  Tobacco Use: Low Risk  (08/01/2024)     Readmission Risk Interventions    08/01/2024    9:42 AM  Readmission Risk Prevention Plan  Post Dischage Appt Complete  Medication Screening Complete  Transportation Screening Complete

## 2024-08-02 NOTE — Progress Notes (Signed)
 Subjective: No complaints except hungry.  Moving his bowels and feels great.  ROS: See above, otherwise other systems negative  Objective: Vital signs in last 24 hours: Temp:  [97.7 F (36.5 C)-98.2 F (36.8 C)] 98.2 F (36.8 C) (08/05 0603) Pulse Rate:  [64-79] 79 (08/05 0603) Resp:  [14-18] 17 (08/05 0603) BP: (142-153)/(79-98) 153/82 (08/05 0603) SpO2:  [99 %-100 %] 99 % (08/05 0603) Last BM Date : 08/01/24  Intake/Output from previous day: 08/04 0701 - 08/05 0700 In: 688.3 [I.V.:688.3] Out: 1500 [Urine:550; Emesis/NG output:950] Intake/Output this shift: No intake/output data recorded.  PE: Gen: NAD Abd: soft, NT, ND, NGT in place with essentially no output  Lab Results:  Recent Labs    07/31/24 2033 07/31/24 2136 08/01/24 0426  WBC 8.8  --  8.8  HGB 16.3 15.6 13.5  HCT 50.4 46.0 40.8  PLT 224  --  186   BMET Recent Labs    07/31/24 2134 07/31/24 2136 08/01/24 0540  NA 134* 137 135  K 3.9 3.9 4.0  CL 103 102 102  CO2 24  --  22  GLUCOSE 116* 112* 111*  BUN 16 14 15   CREATININE 0.93 0.80 0.91  CALCIUM 8.5*  --  8.7*   PT/INR No results for input(s): LABPROT, INR in the last 72 hours. CMP     Component Value Date/Time   NA 135 08/01/2024 0540   NA 137 03/02/2024 0000   K 4.0 08/01/2024 0540   CL 102 08/01/2024 0540   CO2 22 08/01/2024 0540   GLUCOSE 111 (H) 08/01/2024 0540   BUN 15 08/01/2024 0540   BUN 25 03/02/2024 0000   CREATININE 0.91 08/01/2024 0540   CALCIUM 8.7 (L) 08/01/2024 0540   PROT 6.8 08/01/2024 0540   ALBUMIN 3.3 (L) 08/01/2024 0540   AST 35 08/01/2024 0540   ALT 59 (H) 08/01/2024 0540   ALKPHOS 64 08/01/2024 0540   BILITOT 0.7 08/01/2024 0540   GFRNONAA >60 08/01/2024 0540   GFRAA >60 09/19/2020 0804   Lipase     Component Value Date/Time   LIPASE 28 07/31/2024 2134       Studies/Results: DG Abd Portable 1V-Small Bowel Obstruction Protocol-initial, 8 hr delay Result Date: 08/01/2024 EXAM: 1 VIEW  XRAY OF THE ABDOMEN 08/01/2024 07:00:00 PM COMPARISON: None available. CLINICAL HISTORY: 8 hr delay for SBO, pt states he used the bathroom a lot today FINDINGS: BOWEL: Enteric contrast is visualized throughout the colon. SOFT TISSUES: No abnormal calcifications or opaque urinary calculi. BONES: No acute osseous abnormality. LINES AND TUBES: Enteric tube terminates in the stomach with side port near the gastroesophageal junction. Recommended advancement 5 cm to ensure side port is within the stomach. IMPRESSION: 1. No evidence of bowel obstruction. 2. Enteric tip in the stomach with side port near the gastroesophageal junction. Recommend advancement 5 cm to ensure side port is within the stomach. Electronically signed by: Norman Gatlin MD 08/01/2024 10:48 PM EDT RP Workstation: [id number]   DG Abd 1 View Result Date: 08/01/2024 CLINICAL DATA:  Nasogastric tube placement. EXAM: ABDOMEN - 1 VIEW COMPARISON:  July 31, 2024 FINDINGS: An enteric tube is seen and is unchanged in position when compared to the prior study. The bowel gas pattern is normal. Radiopaque contrast is seen within the bilateral renal collecting systems. No radio-opaque calculi or other significant radiographic abnormality are seen. IMPRESSION: Stable enteric tube positioning, as described above. Electronically Signed   By: Suzen Dwane HERO.D.  On: 08/01/2024 00:41   DG Abd Portable 1V-Small Bowel Protocol-Position Verification Result Date: 07/31/2024 CLINICAL DATA:  Nasogastric tube placement. EXAM: PORTABLE ABDOMEN - 1 VIEW COMPARISON:  None Available. FINDINGS: A nasogastric tube is seen with its distal tip overlying the body of the stomach. Its distal side hole sits approximally 2.5 cm distal to the expected region of the gastroesophageal junction. The bowel gas pattern is normal. No radio-opaque calculi or other significant radiographic abnormality are seen. IMPRESSION: Nasogastric tube positioning, as described above. Electronically  Signed   By: Suzen Dials M.D.   On: 07/31/2024 23:46   CT ABDOMEN PELVIS W CONTRAST Result Date: 07/31/2024 CLINICAL DATA:  Abdominal pain since noon today, diarrhea EXAM: CT ABDOMEN AND PELVIS WITH CONTRAST TECHNIQUE: Multidetector CT imaging of the abdomen and pelvis was performed using the standard protocol following bolus administration of intravenous contrast. RADIATION DOSE REDUCTION: This exam was performed according to the departmental dose-optimization program which includes automated exposure control, adjustment of the mA and/or kV according to patient size and/or use of iterative reconstruction technique. CONTRAST:  OMNIPAQUE  IOHEXOL  300 MG/ML  SOLN COMPARISON:  07/25/2024, 07/31/2024 FINDINGS: Lower chest: No acute pleural or parenchymal lung disease. Hepatobiliary: No focal liver abnormality is seen. No gallstones, gallbladder wall thickening, or biliary dilatation. Pancreas: Unremarkable. No pancreatic ductal dilatation or surrounding inflammatory changes. Spleen: Normal in size without focal abnormality. Adrenals/Urinary Tract: Adrenal glands are unremarkable. Kidneys are normal, without renal calculi, focal lesion, or hydronephrosis. Bladder is unremarkable. Stomach/Bowel: Multiple dilated loops of small bowel are identified, measuring up to 3.8 cm in diameter with multiple gas fluid levels, consistent with small-bowel obstruction. Transition point within the mid to distal jejunum in the right mid abdomen, reference image 53/2. The distal small bowel is decompressed. Minimal gas and stool throughout the colon. Scattered colonic diverticulosis without evidence of acute diverticulitis. Vascular/Lymphatic: Aortic atherosclerosis. No enlarged abdominal or pelvic lymph nodes. Reproductive: Prostate is unremarkable. Other: No free fluid or free intraperitoneal gas. No abdominal wall hernia. Musculoskeletal: No acute or destructive bony abnormalities. Reconstructed images demonstrate no  additional findings. IMPRESSION: 1. Small-bowel obstruction, with transition point in the mid to distal jejunum in the right mid abdomen. 2. Colonic diverticulosis without diverticulitis. 3.  Aortic Atherosclerosis (ICD10-I70.0). Electronically Signed   By: Ozell Daring M.D.   On: 07/31/2024 22:56   DG ABD ACUTE 2+V W 1V CHEST Result Date: 07/31/2024 EXAM: UPRIGHT AND SUPINE XRAY VIEWS OF THE ABDOMEN AND 4 VIEW(S) OF THE CHEST 07/31/2024 08:46:27 PM COMPARISON: 01/08/2023 CLINICAL HISTORY: Ab pain, constipation. Severe lower abdominal pain and vomiting for three days. FINDINGS: LUNGS AND PLEURA: The lungs are clear. No pleural effusion or pneumothorax. HEART AND MEDIASTINUM: Stable cardiomediastinal silhouette. BOWEL: Dilated loops of small bowel with air-fluid levels. CT is recommended to evaluate for bowel obstruction. PERITONEUM AND SOFT TISSUES: No abnormal calcifications. No free air. BONES: No acute fracture. IMPRESSION: 1. Dilated loops of small bowel with air-fluid levels, concerning for bowel obstruction. CT with IV contrast recommended for further evaluation. Electronically signed by: Norman Gatlin MD 07/31/2024 09:02 PM EDT RP Workstation: [id number]    Anti-infectives: Anti-infectives (From admission, onward)    None        Assessment/Plan SBO -DC NGT.  8-hr delay film reviewed and contrast in colon and no evidence of SBO -FLD, ADAT to soft, once he tolerates this, he can DC home from our standpoint -no surgical indications at this time. -d/w primary service.   FEN - FLD, ADAT VTE -  ok for prophylaxis ID - none  I reviewed hospitalist notes, last 24 h vitals and pain scores, last 48 h intake and output, last 24 h labs and trends, and last 24 h imaging results.   LOS: 2 days    Burnard FORBES Banter , [facility] 08/02/2024, 8:02 AM Please see Amion for pager number during day hours 7:00am-4:30pm or 7:00am -11:30am on weekends

## 2024-08-02 NOTE — Discharge Summary (Signed)
 Physician Discharge Summary   Caleb Rivers FMW:[id number] DOB: 06/23/1956 DOA: 07/31/2024  PCP: Celestia Rosaline SQUIBB, NP  Admit date: 07/31/2024 Discharge date: 08/02/2024  Admitted From: Home Disposition:  Home Discharging physician: Alm Apo, MD Barriers to discharge: none   Discharge Condition: stable CODE STATUS: Full  Diet recommendation:  Diet Orders (From admission, onward)     Start     Ordered   08/02/24 1054  DIET SOFT Room service appropriate? Yes; Fluid consistency: Thin  Diet effective now       Question Answer Comment  Room service appropriate? Yes   Fluid consistency: Thin      08/02/24 1053   08/02/24 0000  Diet general        08/02/24 1408            Hospital Course: Caleb Rivers is a 68 yo male with PMH SBO s/p diagnostic laparoscopy 02/26/23, gastric ulcer, HTN, GERD who presented with abdominal pain and nausea/vomiting. CT abdomen/pelvis showed SBO with transition point in the mid to distal jejunum in the right mid abdomen. He underwent NG tube placement and was placed on bowel rest and admitted for general surgery evaluation. He had rapid improvement and NG tube was able to be discontinued after clamping and diet trial. Diet was advanced and he tolerated well prior to discharge home.  Assessment and Plan: * SBO (small bowel obstruction) (HCC) - History of SBO February 2024 and underwent diagnostic laparoscopy on 02/26/2023; no obvious obstruction or adhesions when seen at that time -Underwent NG tube placement on admission -This morning he is reporting having some flatus but no bowel movement -General Surgery following, appreciate assistance.  Small bowel follow-through being planned - Contrast noted in colon on small bowel follow-through.  NG tube was clamped and he tolerated well along with diet advancement.  Essential hypertension - Resume home regimen  History of peptic ulcer disease - Continue PPI   The patient's acute and chronic  medical conditions were treated accordingly. On day of discharge, patient was felt deemed stable for discharge. Patient/family member advised to call PCP or come back to ER if needed.   Principal Diagnosis: SBO (small bowel obstruction) (HCC)  Discharge Diagnoses: Active Hospital Problems   Diagnosis Date Noted   SBO (small bowel obstruction) (HCC) 08/30/2014    Priority: 1.   Essential hypertension 07/18/2023    Priority: 3.   History of peptic ulcer disease 09/05/2019    Resolved Hospital Problems  No resolved problems to display.     Discharge Instructions     Diet general   Complete by: As directed    Increase activity slowly   Complete by: As directed       Allergies as of 08/02/2024       Reactions   Asa [aspirin] Other (See Comments)   Current and Hx of GI ulcer - per Pt and friend/translator   Nsaids Other (See Comments)   Current and Hx of GI ulcer per pt and friend/translator   Peanut-containing Drug Products Nausea And Vomiting        Medication List     TAKE these medications    acetaminophen  500 MG tablet Commonly known as: TYLENOL  Take 1 tablet (500 mg total) by mouth every 6 (six) hours as needed. What changed: reasons to take this   amLODipine  5 MG tablet Commonly known as: NORVASC  Take 1 tablet (5 mg total) by mouth daily.   lansoprazole  30 MG capsule Commonly known as: Prevacid  Take 1 capsule (  30 mg total) by mouth daily.   ondansetron  4 MG tablet Commonly known as: ZOFRAN  Take 1 tablet (4 mg total) by mouth every 6 (six) hours as needed for nausea.   polyethylene glycol powder 17 GM/SCOOP powder Commonly known as: MiraLax  Mix 1 capful (17g) in 4-8 ounces of water twice daily for 3 days, then once daily thereafter.   Stool Softener/Laxative 50-8.6 MG tablet Generic drug: senna-docusate Prendre 1 comprim par voie orale quotidiennement. (Take 1 tablet by mouth daily.)   sucralfate  1 g tablet Commonly known as: Carafate  Take 1  tablet (1 g total) by mouth 4 (four) times daily -  with meals and at bedtime.        Allergies  Allergen Reactions   Asa [Aspirin] Other (See Comments)    Current and Hx of GI ulcer - per Pt and friend/translator   Nsaids Other (See Comments)    Current and Hx of GI ulcer per pt and friend/translator   Peanut-Containing Drug Products Nausea And Vomiting    Consultations: General surgery  Procedures:   Discharge Exam: BP (!) 146/94 (BP Location: Right Arm)   Pulse 74   Temp 98.7 F (37.1 C) (Oral)   Resp 18   Ht 6' 5.17 (1.96 m)   Wt 97.5 kg   SpO2 100%   BMI 25.39 kg/m  Physical Exam Constitutional:      General: He is not in acute distress.    Appearance: Normal appearance.  HENT:     Head: Normocephalic and atraumatic.     Comments: NG tube in place    Mouth/Throat:     Mouth: Mucous membranes are moist.  Eyes:     Extraocular Movements: Extraocular movements intact.  Cardiovascular:     Rate and Rhythm: Normal rate and regular rhythm.  Pulmonary:     Effort: Pulmonary effort is normal. No respiratory distress.     Breath sounds: Normal breath sounds. No wheezing.  Abdominal:     General: Bowel sounds are normal. There is no distension.     Palpations: Abdomen is soft.     Tenderness: There is no abdominal tenderness.  Musculoskeletal:        General: Normal range of motion.     Cervical back: Normal range of motion and neck supple.  Skin:    General: Skin is warm and dry.  Neurological:     General: No focal deficit present.     Mental Status: He is alert.  Psychiatric:        Mood and Affect: Mood normal.        Behavior: Behavior normal.      The results of significant diagnostics from this hospitalization (including imaging, microbiology, ancillary and laboratory) are listed below for reference.   Microbiology: No results found for this or any previous visit (from the past 240 hours).   Labs: BNP (last 3 results) No results for input(s):  BNP in the last 8760 hours. Basic Metabolic Panel: Recent Labs  Lab 07/31/24 2134 07/31/24 2136 08/01/24 0540  NA 134* 137 135  K 3.9 3.9 4.0  CL 103 102 102  CO2 24  --  22  GLUCOSE 116* 112* 111*  BUN 16 14 15   CREATININE 0.93 0.80 0.91  CALCIUM 8.5*  --  8.7*  MG  --   --  2.0  PHOS  --   --  3.8   Liver Function Tests: Recent Labs  Lab 07/31/24 2134 08/01/24 0540  AST 39 35  ALT 65* 59*  ALKPHOS 70 64  BILITOT 0.5 0.7  PROT 7.0 6.8  ALBUMIN 3.3* 3.3*   Recent Labs  Lab 07/31/24 2134  LIPASE 28   No results for input(s): AMMONIA in the last 168 hours. CBC: Recent Labs  Lab 07/31/24 2033 07/31/24 2136 08/01/24 0426  WBC 8.8  --  8.8  HGB 16.3 15.6 13.5  HCT 50.4 46.0 40.8  MCV 95.1  --  95.6  PLT 224  --  186   Cardiac Enzymes: No results for input(s): CKTOTAL, CKMB, CKMBINDEX, TROPONINI in the last 168 hours. BNP: Invalid input(s): POCBNP CBG: No results for input(s): GLUCAP in the last 168 hours. D-Dimer No results for input(s): DDIMER in the last 72 hours. Hgb A1c No results for input(s): HGBA1C in the last 72 hours. Lipid Profile No results for input(s): CHOL, HDL, LDLCALC, TRIG, CHOLHDL, LDLDIRECT in the last 72 hours. Thyroid function studies No results for input(s): TSH, T4TOTAL, T3FREE, THYROIDAB in the last 72 hours.  Invalid input(s): FREET3 Anemia work up No results for input(s): VITAMINB12, FOLATE, FERRITIN, TIBC, IRON, RETICCTPCT in the last 72 hours. Urinalysis    Component Value Date/Time   COLORURINE YELLOW 07/31/2024 0700   APPEARANCEUR CLEAR 07/31/2024 0700   LABSPEC 1.027 07/31/2024 0700   PHURINE 7.0 07/31/2024 0700   GLUCOSEU NEGATIVE 07/31/2024 0700   HGBUR NEGATIVE 07/31/2024 0700   BILIRUBINUR NEGATIVE 07/31/2024 0700   KETONESUR NEGATIVE 07/31/2024 0700   PROTEINUR NEGATIVE 07/31/2024 0700   UROBILINOGEN 0.2 08/30/2014 1922   NITRITE NEGATIVE 07/31/2024 0700    LEUKOCYTESUR NEGATIVE 07/31/2024 0700   Sepsis Labs Recent Labs  Lab 07/31/24 2033 08/01/24 0426  WBC 8.8 8.8   Microbiology No results found for this or any previous visit (from the past 240 hours).  Procedures/Studies: DG Abd Portable 1V-Small Bowel Obstruction Protocol-initial, 8 hr delay Result Date: 08/01/2024 EXAM: 1 VIEW XRAY OF THE ABDOMEN 08/01/2024 07:00:00 PM COMPARISON: None available. CLINICAL HISTORY: 8 hr delay for SBO, pt states he used the bathroom a lot today FINDINGS: BOWEL: Enteric contrast is visualized throughout the colon. SOFT TISSUES: No abnormal calcifications or opaque urinary calculi. BONES: No acute osseous abnormality. LINES AND TUBES: Enteric tube terminates in the stomach with side port near the gastroesophageal junction. Recommended advancement 5 cm to ensure side port is within the stomach. IMPRESSION: 1. No evidence of bowel obstruction. 2. Enteric tip in the stomach with side port near the gastroesophageal junction. Recommend advancement 5 cm to ensure side port is within the stomach. Electronically signed by: Norman Gatlin MD 08/01/2024 10:48 PM EDT RP Workstation: [id number]   DG Abd 1 View Result Date: 08/01/2024 CLINICAL DATA:  Nasogastric tube placement. EXAM: ABDOMEN - 1 VIEW COMPARISON:  July 31, 2024 FINDINGS: An enteric tube is seen and is unchanged in position when compared to the prior study. The bowel gas pattern is normal. Radiopaque contrast is seen within the bilateral renal collecting systems. No radio-opaque calculi or other significant radiographic abnormality are seen. IMPRESSION: Stable enteric tube positioning, as described above. Electronically Signed   By: Suzen Dials M.D.   On: 08/01/2024 00:41   DG Abd Portable 1V-Small Bowel Protocol-Position Verification Result Date: 07/31/2024 CLINICAL DATA:  Nasogastric tube placement. EXAM: PORTABLE ABDOMEN - 1 VIEW COMPARISON:  None Available. FINDINGS: A nasogastric tube is seen with its  distal tip overlying the body of the stomach. Its distal side hole sits approximally 2.5 cm distal to the expected region of the gastroesophageal junction. The  bowel gas pattern is normal. No radio-opaque calculi or other significant radiographic abnormality are seen. IMPRESSION: Nasogastric tube positioning, as described above. Electronically Signed   By: Suzen Dials M.D.   On: 07/31/2024 23:46   CT ABDOMEN PELVIS W CONTRAST Result Date: 07/31/2024 CLINICAL DATA:  Abdominal pain since noon today, diarrhea EXAM: CT ABDOMEN AND PELVIS WITH CONTRAST TECHNIQUE: Multidetector CT imaging of the abdomen and pelvis was performed using the standard protocol following bolus administration of intravenous contrast. RADIATION DOSE REDUCTION: This exam was performed according to the departmental dose-optimization program which includes automated exposure control, adjustment of the mA and/or kV according to patient size and/or use of iterative reconstruction technique. CONTRAST:  OMNIPAQUE  IOHEXOL  300 MG/ML  SOLN COMPARISON:  07/25/2024, 07/31/2024 FINDINGS: Lower chest: No acute pleural or parenchymal lung disease. Hepatobiliary: No focal liver abnormality is seen. No gallstones, gallbladder wall thickening, or biliary dilatation. Pancreas: Unremarkable. No pancreatic ductal dilatation or surrounding inflammatory changes. Spleen: Normal in size without focal abnormality. Adrenals/Urinary Tract: Adrenal glands are unremarkable. Kidneys are normal, without renal calculi, focal lesion, or hydronephrosis. Bladder is unremarkable. Stomach/Bowel: Multiple dilated loops of small bowel are identified, measuring up to 3.8 cm in diameter with multiple gas fluid levels, consistent with small-bowel obstruction. Transition point within the mid to distal jejunum in the right mid abdomen, reference image 53/2. The distal small bowel is decompressed. Minimal gas and stool throughout the colon. Scattered colonic diverticulosis  without evidence of acute diverticulitis. Vascular/Lymphatic: Aortic atherosclerosis. No enlarged abdominal or pelvic lymph nodes. Reproductive: Prostate is unremarkable. Other: No free fluid or free intraperitoneal gas. No abdominal wall hernia. Musculoskeletal: No acute or destructive bony abnormalities. Reconstructed images demonstrate no additional findings. IMPRESSION: 1. Small-bowel obstruction, with transition point in the mid to distal jejunum in the right mid abdomen. 2. Colonic diverticulosis without diverticulitis. 3.  Aortic Atherosclerosis (ICD10-I70.0). Electronically Signed   By: Ozell Daring M.D.   On: 07/31/2024 22:56   DG ABD ACUTE 2+V W 1V CHEST Result Date: 07/31/2024 EXAM: UPRIGHT AND SUPINE XRAY VIEWS OF THE ABDOMEN AND 4 VIEW(S) OF THE CHEST 07/31/2024 08:46:27 PM COMPARISON: 01/08/2023 CLINICAL HISTORY: Ab pain, constipation. Severe lower abdominal pain and vomiting for three days. FINDINGS: LUNGS AND PLEURA: The lungs are clear. No pleural effusion or pneumothorax. HEART AND MEDIASTINUM: Stable cardiomediastinal silhouette. BOWEL: Dilated loops of small bowel with air-fluid levels. CT is recommended to evaluate for bowel obstruction. PERITONEUM AND SOFT TISSUES: No abnormal calcifications. No free air. BONES: No acute fracture. IMPRESSION: 1. Dilated loops of small bowel with air-fluid levels, concerning for bowel obstruction. CT with IV contrast recommended for further evaluation. Electronically signed by: Norman Gatlin MD 07/31/2024 09:02 PM EDT RP Workstation: [id number]   CT ABDOMEN PELVIS W CONTRAST Result Date: 07/25/2024 CLINICAL DATA:  Abdominal pain, acute, nonlocalized EXAM: CT ABDOMEN AND PELVIS WITH CONTRAST TECHNIQUE: Multidetector CT imaging of the abdomen and pelvis was performed using the standard protocol following bolus administration of intravenous contrast. RADIATION DOSE REDUCTION: This exam was performed according to the departmental dose-optimization program  which includes automated exposure control, adjustment of the mA and/or kV according to patient size and/or use of iterative reconstruction technique. CONTRAST:  75mL OMNIPAQUE  IOHEXOL  350 MG/ML SOLN COMPARISON:  April 16, 2024 FINDINGS: Lower chest: No focal airspace consolidation or pleural effusion. Hepatobiliary: No mass.No radiopaque stones or wall thickening of the gallbladder. No intrahepatic or extrahepatic biliary ductal dilation. The portal veins are patent. Pancreas: No mass or main ductal dilation. No  peripancreatic inflammation or fluid collection. Spleen: Normal size. No mass. Adrenals/Urinary Tract: No adrenal masses. A couple of subcentimeter hypodensities are noted in the kidneys, too small to definitively characterize, but likely small cysts. No nephrolithiasis or hydronephrosis. Circumferential wall thickening of the urinary bladder. Stomach/Bowel: Small hiatal hernia. The stomach is decompressed without focal abnormality. No small bowel wall thickening or inflammation. No small bowel obstruction.Normal appendix. Total colonic diverticulosis. No changes of acute diverticulitis. Vascular/Lymphatic: No aortic aneurysm. Scattered aortoiliac atherosclerosis. No intraabdominal or pelvic lymphadenopathy. Reproductive: Mild prostatomegaly.No free pelvic fluid. Other: No pneumoperitoneum, ascites, or mesenteric inflammation. Musculoskeletal: No acute fracture or destructive lesion. Multilevel thoracic osteophytosis. IMPRESSION: 1. Circumferential wall thickening of the urinary bladder, which is likely due to underdistension. If there is concern for acute cystitis, correlation with urinalysis would be recommended. 2. Total colonic diverticulosis. No changes of acute diverticulitis. 3. Mild prostatomegaly. Aortic Atherosclerosis (ICD10-I70.0). Electronically Signed   By: Rogelia Myers M.D.   On: 07/25/2024 17:58     Time coordinating discharge: Over 30 minutes    Alm Apo, MD  [facility] 08/02/2024, 2:08 PM

## 2024-08-03 ENCOUNTER — Other Ambulatory Visit: Payer: Self-pay

## 2024-08-03 ENCOUNTER — Other Ambulatory Visit (HOSPITAL_COMMUNITY): Payer: Self-pay

## 2024-08-03 ENCOUNTER — Telehealth: Payer: Self-pay

## 2024-08-03 ENCOUNTER — Other Ambulatory Visit: Payer: Self-pay | Admitting: Internal Medicine

## 2024-08-03 MED ORDER — SUCRALFATE 1 G PO TABS
1.0000 g | ORAL_TABLET | Freq: Three times a day (TID) | ORAL | 0 refills | Status: DC
  Filled 2024-08-03: qty 60, 15d supply, fill #0

## 2024-08-03 NOTE — Transitions of Care (Post Inpatient/ED Visit) (Signed)
   08/03/2024  Name: Caleb Rivers MRN: [id number] DOB: 1956-10-03  Today's TOC FU Call Status: Today's TOC FU Call Status:: Unsuccessful Call (1st Attempt) Unsuccessful Call (1st Attempt) Date: 08/03/24  Attempted to reach the patient regarding the most recent Inpatient/ED visit.  Follow Up Plan: Additional outreach attempts will be made to reach the patient to complete the Transitions of Care (Post Inpatient/ED visit) call.   Signature  Slater Diesel, RN

## 2024-08-04 ENCOUNTER — Telehealth: Payer: Self-pay

## 2024-08-04 NOTE — Transitions of Care (Post Inpatient/ED Visit) (Signed)
   08/04/2024  Name: Caleb Rivers MRN: [id number] DOB: 01-Aug-1956  Today's TOC FU Call Status: Today's TOC FU Call Status:: Unsuccessful Call (2nd Attempt) Unsuccessful Call (1st Attempt) Date: 08/03/24 Unsuccessful Call (2nd Attempt) Date: 08/04/24  Attempted to reach the patient regarding the most recent Inpatient/ED visit.  Follow Up Plan: Additional outreach attempts will be made to reach the patient to complete the Transitions of Care (Post Inpatient/ED visit) call.   Signature  Slater Diesel, RN

## 2024-08-08 ENCOUNTER — Telehealth: Payer: Self-pay

## 2024-08-08 NOTE — Transitions of Care (Post Inpatient/ED Visit) (Signed)
   08/08/2024  Name: Caleb Rivers MRN: [id number] DOB: 1956/03/17  Today's TOC FU Call Status: Today's TOC FU Call Status:: Unsuccessful Call (3rd Attempt) Unsuccessful Call (1st Attempt) Date: 08/03/24 Unsuccessful Call (2nd Attempt) Date: 08/04/24 Unsuccessful Call (3rd Attempt) Date: 08/08/24  Attempted to reach the patient regarding the most recent Inpatient/ED visit.  Follow Up Plan: No further outreach attempts will be made at this time. We have been unable to contact the patient.  Signature  Slater Diesel, RN

## 2024-09-11 ENCOUNTER — Emergency Department (HOSPITAL_COMMUNITY)
Admission: EM | Admit: 2024-09-11 | Discharge: 2024-09-11 | Disposition: A | Discharge status: Home / Self Care | Payer: Self-pay | Attending: Emergency Medicine | Admitting: Emergency Medicine

## 2024-09-11 ENCOUNTER — Other Ambulatory Visit: Payer: Self-pay

## 2024-09-11 ENCOUNTER — Emergency Department (HOSPITAL_COMMUNITY): Payer: Self-pay

## 2024-09-11 DIAGNOSIS — K5900 Constipation, unspecified: Secondary | ICD-10-CM | POA: Insufficient documentation

## 2024-09-11 LAB — CBC WITH DIFFERENTIAL/PLATELET
Abs Immature Granulocytes: 0.03 K/uL (ref 0.00–0.07)
Basophils Absolute: 0 K/uL (ref 0.0–0.1)
Basophils Relative: 1 %
Eosinophils Absolute: 0.1 K/uL (ref 0.0–0.5)
Eosinophils Relative: 1 %
HCT: 48.2 % (ref 39.0–52.0)
Hemoglobin: 16 g/dL (ref 13.0–17.0)
Immature Granulocytes: 1 %
Lymphocytes Relative: 29 %
Lymphs Abs: 1.5 K/uL (ref 0.7–4.0)
MCH: 31.3 pg (ref 26.0–34.0)
MCHC: 33.2 g/dL (ref 30.0–36.0)
MCV: 94.3 fL (ref 80.0–100.0)
Monocytes Absolute: 0.6 K/uL (ref 0.1–1.0)
Monocytes Relative: 12 %
Neutro Abs: 2.8 K/uL (ref 1.7–7.7)
Neutrophils Relative %: 56 %
Platelets: 223 K/uL (ref 150–400)
RBC: 5.11 MIL/uL (ref 4.22–5.81)
RDW: 14.2 % (ref 11.5–15.5)
WBC: 5 K/uL (ref 4.0–10.5)
nRBC: 0 % (ref 0.0–0.2)

## 2024-09-11 LAB — BASIC METABOLIC PANEL WITH GFR
Anion gap: 11 (ref 5–15)
BUN: 17 mg/dL (ref 8–23)
CO2: 23 mmol/L (ref 22–32)
Calcium: 9.6 mg/dL (ref 8.9–10.3)
Chloride: 105 mmol/L (ref 98–111)
Creatinine, Ser: 1.04 mg/dL (ref 0.61–1.24)
GFR, Estimated: >60 mL/min (ref >60)
Glucose, Bld: 101 mg/dL — ABNORMAL HIGH (ref 70–99)
Potassium: 4.1 mmol/L (ref 3.5–5.1)
Sodium: 139 mmol/L (ref 135–145)

## 2024-09-11 MED ORDER — IOHEXOL 300 MG/ML  SOLN
100.0000 mL | Freq: Once | INTRAMUSCULAR | Status: AC | PRN
  Administered 2024-09-11: 100 mL via INTRAVENOUS

## 2024-09-11 MED ORDER — FLEET ENEMA RE ENEM: 1.0000 | ENEMA | Freq: Once | RECTAL | Status: DC

## 2024-09-11 MED ORDER — SENNOSIDES-DOCUSATE SODIUM 8.6-50 MG PO TABS
1.0000 | ORAL_TABLET | Freq: Every day | ORAL | 0 refills | Status: DC
  Filled 2024-09-11: qty 20, 20d supply, fill #0

## 2024-09-11 NOTE — ED Provider Notes (Signed)
 [facility]PT [facility] Emergency Department Provider Note MRN:  [id number]  Arrival date & time: 09/11/24     Chief Complaint   Constipation   History of Present Illness   Caleb Rivers is a 68 y.o. year-old male presents to the ED with chief complaint of inability to have a bowel movement.  Was admitted back in August for small bowel obstruction.  He thinks that he is obstructed or constipated.  States that he had a normal bowel movement yesterday.  He states that when he tries to have a bowel movement he has significant pain.  He denies any nausea or vomiting.  Denies fevers or chills.  Denies any other associated symptoms.  History provided by patient.   Review of Systems  Pertinent positive and negative review of systems noted in HPI.    Physical Exam   Vitals:   09/11/24 0449 09/11/24 0728  BP: (!) 163/97 (!) 144/93  Pulse: 92 68  Resp: 16 16  Temp: 97.6 F (36.4 C) 97.9 F (36.6 C)  SpO2: 98% 96%    CONSTITUTIONAL:  non toxic-appearing, NAD NEURO:  Alert and oriented x 3, CN 3-12 grossly intact EYES:  eyes equal and reactive ENT/NECK:  Supple, no stridor  CARDIO:  normal rate, regular rhythm, appears well-perfused  PULM:  No respiratory distress, CTAB GI/GU:  non-distended, mild generalized abdominal tenderness, RN chaperone present for rectal exam, there is no palpable stool ball, no bleeding MSK/SPINE:  No gross deformities, no edema, moves all extremities  SKIN:  no rash, atraumatic   *Additional and/or pertinent findings included in MDM below  Diagnostic and Interventional Summary    EKG Interpretation Date/Time:    Ventricular Rate:    PR Interval:    QRS Duration:    QT Interval:    QTC Calculation:   R Axis:      Text Interpretation:         Labs Reviewed  BASIC METABOLIC PANEL WITH GFR - Abnormal; Notable for the following components:      Result Value   Glucose, Bld 101 (*)    All other components within normal limits   CBC WITH DIFFERENTIAL/PLATELET    CT ABDOMEN PELVIS W CONTRAST  Final Result      Medications  iohexol  (OMNIPAQUE ) 300 MG/ML solution 100 mL (100 mLs Intravenous Contrast Given 09/11/24 0703)     Procedures  /  Critical Care Procedures  ED Course and Medical Decision Making  I have reviewed the triage vital signs, the nursing notes, and pertinent available records from the EMR.  Social Determinants Affecting Complexity of Care: Patient .   ED Course:    Medical Decision Making Patient here complaining of inability to have a bowel movement.  Had a small bowel obstruction about a month ago.  He is concerned about obstruction or constipation.  Will check labs and imaging.  If negative, consider enema.  Amount and/or Complexity of Data Reviewed Labs: ordered. Radiology: ordered.  Risk OTC drugs. Prescription drug management.         Consultants:    Treatment and Plan: Patient signed out to oncoming team.    Final Clinical Impressions(s) / ED Diagnoses     ICD-10-CM   1. Constipation, unspecified constipation type  K59.00       ED Discharge Orders          Ordered    senna-docusate (SENOKOT-S) 8.6-50 MG tablet  Daily        09/11/24 0749  Discharge Instructions Discussed with and Provided to Patient:     Discharge Instructions      Your CT did not show bowel obstruction but does show some constipation.  We have provided an enema in the emergency department.  You can take the constipation medication that I have provided, and follow-up closely with your primary care doctor.  I recommend drinking 50 to 64 ounces of water daily.  Please return if you have significant worsening abdominal pain  Votre scanner n'a pas rvl d'occlusion intestinale, mais il a rvl une constipation. Un lavement a t ralis aux urgences. Vous pouvez prendre les mdicaments contre la constipation que je vous ai prescrits et suivre votre mdecin traitant de  prs. Je recommande de boire entre 1,5 et 2 litres d'eau par [employer]. N'hsitez pas  revenir si vos douleurs abdominales s'aggravent significativement       Vicky Charleston, DEVONNA 09/11/24 2350    Long, Joshua G, MD 09/15/24 [phone]

## 2024-09-11 NOTE — ED Triage Notes (Signed)
 Pt complaints of constipation. Last bowel movement yesterday, used Malox one time without improvement.   Denies Abdominal pain, however, when goes to bathroom unable to have a BM. Alert and oriented

## 2024-09-11 NOTE — Discharge Instructions (Addendum)
 Your CT did not show bowel obstruction but does show some constipation.  We have provided an enema in the emergency department.  You can take the constipation medication that I have provided, and follow-up closely with your primary care doctor.  I recommend drinking 50 to 64 ounces of water daily.  Please return if you have significant worsening abdominal pain  Votre scanner n'a pas rvl d'occlusion intestinale, mais il a rvl une constipation. Un lavement a t ralis aux urgences. Vous pouvez prendre les mdicaments contre la constipation que je vous ai prescrits et suivre votre mdecin traitant de prs. Je recommande de boire entre 1,5 et 2 litres d'eau par [employer]. N'hsitez pas  revenir si vos douleurs abdominales s'aggravent significativement

## 2024-09-11 NOTE — ED Provider Notes (Signed)
 Accepted handoff at shift change from [employer]. Please see prior provider note for more detail.   Briefly: Patient is 68 y.o.   DDX: concern for Endorses constipation, reports LBM yesterday. Did have recent admission for obstruction. No stool ball or impaction on CT. Asking for enema. Okay for DC if nothing on CT.  I independently interpreted CT abdomen pelvis with contrast which shows constipation with no bowel obstruction.  Plan: Fleet enema, refill of Senokot, encourage plenty of fluids, close PCP follow-up.  No evidence of impaction, obstruction, or other acute abnormality.    Rosan Sherlean DEL, PA-C 09/11/24 0752    Francesca Elsie CROME, MD 09/11/24 [phone]

## 2024-09-12 ENCOUNTER — Other Ambulatory Visit: Payer: Self-pay

## 2024-09-13 ENCOUNTER — Other Ambulatory Visit: Payer: Self-pay

## 2024-11-11 ENCOUNTER — Emergency Department (HOSPITAL_COMMUNITY): Payer: Self-pay

## 2024-11-11 ENCOUNTER — Emergency Department (HOSPITAL_COMMUNITY)
Admission: EM | Admit: 2024-11-11 | Discharge: 2024-11-11 | Disposition: A | Discharge status: Home / Self Care | Payer: Self-pay

## 2024-11-11 DIAGNOSIS — R10A2 Flank pain, left side: Secondary | ICD-10-CM | POA: Insufficient documentation

## 2024-11-11 DIAGNOSIS — R7401 Elevation of levels of liver transaminase levels: Secondary | ICD-10-CM | POA: Insufficient documentation

## 2024-11-11 DIAGNOSIS — R10A1 Flank pain, right side: Secondary | ICD-10-CM | POA: Insufficient documentation

## 2024-11-11 DIAGNOSIS — Z79899 Other long term (current) drug therapy: Secondary | ICD-10-CM | POA: Insufficient documentation

## 2024-11-11 DIAGNOSIS — M549 Dorsalgia, unspecified: Secondary | ICD-10-CM | POA: Insufficient documentation

## 2024-11-11 LAB — URINALYSIS, ROUTINE W REFLEX MICROSCOPIC
Bilirubin Urine: NEGATIVE
Glucose, UA: NEGATIVE mg/dL
Hgb urine dipstick: NEGATIVE
Ketones, ur: NEGATIVE mg/dL
Leukocytes,Ua: NEGATIVE
Nitrite: NEGATIVE
Protein, ur: NEGATIVE mg/dL
Specific Gravity, Urine: 1.003 — ABNORMAL LOW (ref 1.005–1.030)
pH: 7 (ref 5.0–8.0)

## 2024-11-11 LAB — CBC
HCT: 46 % (ref 39.0–52.0)
Hemoglobin: 15.7 g/dL (ref 13.0–17.0)
MCH: 31.8 pg (ref 26.0–34.0)
MCHC: 34.1 g/dL (ref 30.0–36.0)
MCV: 93.3 fL (ref 80.0–100.0)
Platelets: 217 K/uL (ref 150–400)
RBC: 4.93 MIL/uL (ref 4.22–5.81)
RDW: 14.1 % (ref 11.5–15.5)
WBC: 6.5 K/uL (ref 4.0–10.5)
nRBC: 0 % (ref 0.0–0.2)

## 2024-11-11 LAB — COMPREHENSIVE METABOLIC PANEL WITH GFR
ALT: 66 U/L — ABNORMAL HIGH (ref 0–44)
AST: 59 U/L — ABNORMAL HIGH (ref 15–41)
Albumin: 3.9 g/dL (ref 3.5–5.0)
Alkaline Phosphatase: 95 U/L (ref 38–126)
Anion gap: 7 (ref 5–15)
BUN: 16 mg/dL (ref 8–23)
CO2: 25 mmol/L (ref 22–32)
Calcium: 9.4 mg/dL (ref 8.9–10.3)
Chloride: 102 mmol/L (ref 98–111)
Creatinine, Ser: 0.99 mg/dL (ref 0.61–1.24)
GFR, Estimated: >60 mL/min (ref >60)
Glucose, Bld: 90 mg/dL (ref 70–99)
Potassium: 4.8 mmol/L (ref 3.5–5.1)
Sodium: 134 mmol/L — ABNORMAL LOW (ref 135–145)
Total Bilirubin: 0.4 mg/dL (ref 0.0–1.2)
Total Protein: 7.6 g/dL (ref 6.5–8.1)

## 2024-11-11 LAB — I-STAT CHEM 8, ED
BUN: 23 mg/dL (ref 8–23)
Calcium, Ion: 1.14 mmol/L — ABNORMAL LOW (ref 1.15–1.40)
Chloride: 102 mmol/L (ref 98–111)
Creatinine, Ser: 1.1 mg/dL (ref 0.61–1.24)
Glucose, Bld: 91 mg/dL (ref 70–99)
HCT: 50 % (ref 39.0–52.0)
Hemoglobin: 17 g/dL (ref 13.0–17.0)
Potassium: 6 mmol/L — ABNORMAL HIGH (ref 3.5–5.1)
Sodium: 135 mmol/L (ref 135–145)
TCO2: 26 mmol/L (ref 22–32)

## 2024-11-11 LAB — I-STAT CG4 LACTIC ACID, ED: Lactic Acid, Venous: 0.9 mmol/L (ref 0.5–1.9)

## 2024-11-11 MED ORDER — ACETAMINOPHEN 325 MG PO TABS
650.0000 mg | ORAL_TABLET | Freq: Once | ORAL | Status: AC
  Administered 2024-11-11: 650 mg via ORAL
  Filled 2024-11-11: qty 2

## 2024-11-11 MED ORDER — LIDOCAINE 5 % EX PTCH
1.0000 | MEDICATED_PATCH | CUTANEOUS | 0 refills | Status: DC
  Filled 2024-11-11 – 2024-11-14 (×3): qty 14, 14d supply, fill #0

## 2024-11-11 MED ORDER — METHOCARBAMOL 500 MG PO TABS
500.0000 mg | ORAL_TABLET | Freq: Two times a day (BID) | ORAL | 0 refills | Status: DC
  Filled 2024-11-11 – 2024-11-14 (×3): qty 20, 10d supply, fill #0

## 2024-11-11 MED ORDER — METHOCARBAMOL 500 MG PO TABS
500.0000 mg | ORAL_TABLET | Freq: Once | ORAL | Status: AC
  Administered 2024-11-11: 500 mg via ORAL
  Filled 2024-11-11: qty 1

## 2024-11-11 MED ORDER — IOHEXOL 350 MG/ML SOLN
100.0000 mL | Freq: Once | INTRAVENOUS | Status: AC | PRN
  Administered 2024-11-11: 100 mL via INTRAVENOUS

## 2024-11-11 MED ORDER — LIDOCAINE 5 % EX PTCH
1.0000 | MEDICATED_PATCH | Freq: Once | CUTANEOUS | Status: DC
  Administered 2024-11-11: 1 via TRANSDERMAL
  Filled 2024-11-11: qty 1

## 2024-11-11 NOTE — Discharge Instructions (Addendum)
 Take tylenol  as directed on packaging for baseline pain control. Use lidocaine  patch and muscle relaxer for further pain relief. Follow up with your primary doctor. Return for fevers, chills, severe pain, numbness or weakness in your legs, numbness in your genital area, bowel or bladder incontinence or any new or worsening symptoms that are concerning to you.  Your liver enzymes were slightly elevated, please follow-up with your primary doctor for further evaluation and workup.

## 2024-11-11 NOTE — ED Provider Notes (Signed)
 [place] EMERGENCY DEPARTMENT AT [facility] Provider Note   CSN: [id number] Arrival date & time: 11/11/24  1642     Patient presents with: Pain   Caleb Rivers is a 68 y.o. male.   This is a 68 year old male presenting emergency department for bilateral flank pain.  Symptoms started close to a week ago and has not improved and is slightly worsened.  He is French-speaking, history obtained with aid of video interpreter.  He notes pain radiates down bilateral legs.  His back pain is described as burning.  No weakness in his lower extremities, no urinary symptoms or saddle anesthesia..  No fevers or chills.  Denies abdominal pain nausea vomiting        Prior to Admission medications   Medication Sig Start Date End Date Taking? Authorizing Provider  lidocaine  (LIDODERM ) 5 % Place 1 patch onto the skin daily. Remove & Discard patch within 12 hours or as directed by MD 11/11/24  Yes Neysa Caron PARAS, DO  methocarbamol  (ROBAXIN ) 500 MG tablet Take 1 tablet (500 mg total) by mouth 2 (two) times daily. 11/11/24  Yes Climmie Cronce, Caron PARAS, DO  acetaminophen  (TYLENOL ) 500 MG tablet Take 1 tablet (500 mg total) by mouth every 6 (six) hours as needed. Patient taking differently: Take 500 mg by mouth every 6 (six) hours as needed for mild pain (pain score 1-3) or moderate pain (pain score 4-6). 06/19/23   Theadore Ozell HERO, MD  amLODipine  (NORVASC ) 5 MG tablet Take 1 tablet (5 mg total) by mouth daily. 02/17/24   Amin, Sumayya, MD  lansoprazole  (PREVACID ) 30 MG capsule Take 1 capsule (30 mg total) by mouth daily. 07/25/24 07/25/25  Tonia Chew, MD  ondansetron  (ZOFRAN ) 4 MG tablet Take 1 tablet (4 mg total) by mouth every 6 (six) hours as needed for nausea. 02/16/24   Amin, Sumayya, MD  polyethylene glycol powder (MIRALAX ) 17 GM/SCOOP powder Mix 1 capful (17g) in 4-8 ounces of water twice daily for 3 days, then once daily thereafter. Patient not taking: Reported on 08/01/2024 04/17/24   Beverley Doffing A, PA-C  senna-docusate (SENOKOT-S) 8.6-50 MG tablet Take 1 tablet by mouth daily. 09/11/24   Prosperi, Christian H, PA-C  sucralfate  (CARAFATE ) 1 g tablet Take 1 tablet (1 g total) by mouth 4 (four) times daily -  with meals and at bedtime. 08/03/24   Patsy Lenis, MD    Allergies: Asa [aspirin], Nsaids, and Peanut-containing drug products    Review of Systems  Updated Vital Signs BP (!) 142/92 (BP Location: Right Arm)   Pulse 68   Temp (!) 97.5 F (36.4 C)   Resp 16   SpO2 100%   Physical Exam Vitals and nursing note reviewed.  Constitutional:      General: He is not in acute distress.    Appearance: He is not toxic-appearing.  HENT:     Head: Normocephalic.     Nose: Nose normal.     Mouth/Throat:     Mouth: Mucous membranes are moist.  Eyes:     Conjunctiva/sclera: Conjunctivae normal.  Cardiovascular:     Rate and Rhythm: Normal rate and regular rhythm.     Comments: Pulses diminished bilaterally in lower extremities.  However retained cap refill, limbs are warm.  Soft compartments.   Pulmonary:     Effort: Pulmonary effort is normal.     Breath sounds: Normal breath sounds.  Abdominal:     General: Abdomen is flat. There is no distension.  Tenderness: There is no abdominal tenderness. There is no guarding or rebound.     Hernia: No hernia is present.  Musculoskeletal:     Comments: No midline spinal tenderness, does have some tenderness to his bilateral flanks.  5 out of 5 plantarflexion dorsiflexion, able to lift both legs off the bed.  Equal pulses, diminished pulses bilaterally.  Limbs warm and appear to be well-perfused.  No lower extremity edema  Skin:    General: Skin is warm and dry.     Capillary Refill: Capillary refill takes less than 2 seconds.  Neurological:     Mental Status: He is alert and oriented to person, place, and time.  Psychiatric:        Mood and Affect: Mood normal.        Behavior: Behavior normal.     (all labs ordered are  listed, but only abnormal results are displayed) Labs Reviewed  COMPREHENSIVE METABOLIC PANEL WITH GFR - Abnormal; Notable for the following components:      Result Value   Sodium 134 (*)    AST 59 (*)    ALT 66 (*)    All other components within normal limits  URINALYSIS, ROUTINE W REFLEX MICROSCOPIC - Abnormal; Notable for the following components:   Color, Urine COLORLESS (*)    Specific Gravity, Urine 1.003 (*)    All other components within normal limits  I-STAT CHEM 8, ED - Abnormal; Notable for the following components:   Potassium 6.0 (*)    Calcium, Ion 1.14 (*)    All other components within normal limits  CBC  I-STAT CG4 LACTIC ACID, ED  I-STAT CG4 LACTIC ACID, ED    EKG: EKG Interpretation Date/Time:  Friday November 11 2024 18:47:18 EST Ventricular Rate:  79 PR Interval:  153 QRS Duration:  88 QT Interval:  387 QTC Calculation: 444 R Axis:   -7  Text Interpretation: Sinus rhythm Anteroseptal infarct, old Baseline wander in lead(s) V2 Confirmed by Neysa Clap [phone]) on 11/11/2024 8:47:27 PM  Radiology: CT Angio Chest/Abd/Pel for Dissection W and/or Wo Contrast Result Date: 11/11/2024 CLINICAL DATA:  Back and leg pain per epic notes EXAM: CT ANGIOGRAPHY CHEST, ABDOMEN AND PELVIS TECHNIQUE: Non-contrast CT of the chest was initially obtained. Multidetector CT imaging through the chest, abdomen and pelvis was performed using the standard protocol during bolus administration of intravenous contrast. Multiplanar reconstructed images and MIPs were obtained and reviewed to evaluate the vascular anatomy. RADIATION DOSE REDUCTION: This exam was performed according to the departmental dose-optimization program which includes automated exposure control, adjustment of the mA and/or kV according to patient size and/or use of iterative reconstruction technique. CONTRAST:  OMNIPAQUE  IOHEXOL  350 MG/ML SOLN COMPARISON:  Radiograph 08/01/2024, CT 09/11/2024, 07/31/2024  FINDINGS: CTA CHEST FINDINGS Cardiovascular: Non contrasted images of the chest demonstrate no acute intramural hematoma. Negative for aortic dissection. Nonaneurysmal aorta. Mild atherosclerosis. Normal cardiac size. No pericardial effusion Mediastinum/Nodes: Patent trachea. No thyroid mass. No suspicious lymph nodes. Small hiatal hernia with mild distal esophageal thickening. Lungs/Pleura: No acute airspace disease, pleural effusion or pneumothorax. Distortion and bandlike density at the right apical lung felt most consistent with pulmonary scarring. Scattered calcifications/calcified nodules or granuloma in the region. Mild elevation of right hilus. Musculoskeletal: Sternum is intact.  No acute osseous abnormality. Review of the MIP images confirms the above findings. CTA ABDOMEN AND PELVIS FINDINGS VASCULAR Aorta: Normal caliber aorta without aneurysm, dissection, vasculitis or significant stenosis. Celiac: Patent without evidence of aneurysm, dissection, vasculitis  or significant stenosis. SMA: Patent without evidence of aneurysm, dissection, vasculitis or significant stenosis. Renals: Single right and 2 left renal arteries are patent without evidence of aneurysm, dissection, vasculitis, fibromuscular dysplasia or significant stenosis. IMA: Patent without evidence of aneurysm, dissection, vasculitis or significant stenosis. Inflow: Moderate mural thickening and calcification involving the bilateral common iliac arteries as before. No focal stenosis or occlusion. Suspicion of small ulcerated plaque or penetrating ulcer involving the left common iliac artery, coronal series 20, image 78. Patent internal and external iliac vessels. Veins: Suboptimally assessed Review of the MIP images confirms the above findings. NON-VASCULAR Hepatobiliary: No focal liver abnormality is seen. No gallstones, gallbladder wall thickening, or biliary dilatation. Pancreas: Unremarkable. No pancreatic ductal dilatation or surrounding  inflammatory changes. Spleen: Normal in size without focal abnormality. Adrenals/Urinary Tract: Adrenal glands are unremarkable. Kidneys are normal, without renal calculi, focal lesion, or hydronephrosis. Bladder is unremarkable. Stomach/Bowel: Stomach is within normal limits. No evidence of bowel wall thickening, distention, or inflammatory changes. Mild diverticular disease of the colon. Lymphatic: No suspicious lymph nodes. Reproductive: Prostate slightly enlarged. Other: Negative for ascites or free air Musculoskeletal: No acute or suspicious osseous abnormality Review of the MIP images confirms the above findings. IMPRESSION: 1. Negative for acute aortic dissection or aneurysm. 2. Mild atherosclerosis without significant stenosis or occlusive disease within the abdomen or pelvis 3. No CT evidence for acute intra-abdominal or pelvic abnormality. 4. Small hiatal hernia with mild distal esophageal thickening, possible reflux esophagitis. 5. Distortion and bandlike density at the right apical lung with slight elevation of the right hilus, favored to represent pleuroparenchymal scarring. 6. Aortic atherosclerosis. Aortic Atherosclerosis (ICD10-I70.0). Electronically Signed   By: Luke Bun M.D.   On: 11/11/2024 19:55     Procedures   Medications Ordered in the ED  lidocaine  (LIDODERM ) 5 % 1 patch (1 patch Transdermal Patch Applied 11/11/24 2009)  iohexol  (OMNIPAQUE ) 350 MG/ML injection 100 mL (100 mLs Intravenous Contrast Given 11/11/24 1919)  methocarbamol  (ROBAXIN ) tablet 500 mg (500 mg Oral Given 11/11/24 2007)  acetaminophen  (TYLENOL ) tablet 650 mg (650 mg Oral Given 11/11/24 2007)    Clinical Course as of 11/11/24 2247  Fri Nov 11, 2024  1653 Admitted 8/3 for SBO.   PMH per chart review includes:PMH SBO s/p diagnostic laparoscopy 02/26/23, gastric ulcer, HTN, GERD  [TY]  1654 CT abd pelvis in sept :IMPRESSION: 1. Increased retained stool in the large bowel since last month. No other  acute or inflammatory process identified in the abdomen or pelvis. No evidence of bowel obstruction.   2.  Aortic Atherosclerosis (ICD10-I70.0)  [TY]  2008 CT Angio Chest/Abd/Pel for Dissection W and/or Wo Contrast IMPRESSION: 1. Negative for acute aortic dissection or aneurysm. 2. Mild atherosclerosis without significant stenosis or occlusive disease within the abdomen or pelvis 3. No CT evidence for acute intra-abdominal or pelvic abnormality. 4. Small hiatal hernia with mild distal esophageal thickening, possible reflux esophagitis. 5. Distortion and bandlike density at the right apical lung with slight elevation of the right hilus, favored to represent pleuroparenchymal scarring. 6. Aortic atherosclerosis.   [TY]  2008 Comprehensive metabolic panel(!) Mild transaminitis noted.  Had similarly elevated ALT a few months ago.  AST newly elevated.  Not having right upper quadrant abdominal pain or tenderness.  No abnormalities on CT scan.  Unclear clinical significance at this time.  He is obese, fatty liver?  Will have patient follow-up with PCP. [TY]  2246 Patient with some improvement after medications.  Given his largely reassuring  workup today feel that he is appropriate for outpatient workup as I did not identify acute emergent cause for his conditions.  He was given strict return precautions.  Stable for discharge. [TY]    Clinical Course User Index [TY] Neysa Caron PARAS, DO                                 Medical Decision Making This is a 68 year old male presenting the emergency department with back pain.  History limited by language barrier and video interpreter was used.  He is afebrile nontachycardic his hypertensive.  Symptoms been ongoing for close to a week he says.  No inciting trauma.  He does note seems to be bilateral flank pain rating downwards towards his bilateral legs.  Symptoms may be MSK, however he notes he has not really had issues with back pain much in the  past.  He does have some diminished pulses in his lower extremities although mildly so and equal.  Upon reviewing chart does have a history of SBO and did have some aortic atherosclerosis.  Possible aneurysm/dissection?.  Will get CTA to rule out.  Will also get screening labs.  UA to evaluate for infection.    See ED course for further MDM and disposition.  Amount and/or Complexity of Data Reviewed External Data Reviewed:     Details: See ED course Labs: ordered. Decision-making details documented in ED Course. Radiology: ordered and independent interpretation performed. Decision-making details documented in ED Course. ECG/medicine tests: ordered and independent interpretation performed.  Risk OTC drugs. Prescription drug management. Decision regarding hospitalization. Diagnosis or treatment significantly limited by social determinants of health.       Final diagnoses:  Acute bilateral back pain, unspecified back location    ED Discharge Orders          Ordered    lidocaine  (LIDODERM ) 5 %  Every 24 hours        11/11/24 2049    methocarbamol  (ROBAXIN ) 500 MG tablet  2 times daily        11/11/24 2049               Neysa Caron PARAS, DO 11/11/24 2247

## 2024-11-11 NOTE — ED Triage Notes (Signed)
 C/o back, side, and leg pain. States it started yesterday and the pain is all over.

## 2024-11-12 ENCOUNTER — Other Ambulatory Visit (HOSPITAL_COMMUNITY): Payer: Self-pay

## 2024-11-14 ENCOUNTER — Other Ambulatory Visit (HOSPITAL_COMMUNITY): Payer: Self-pay

## 2024-11-14 ENCOUNTER — Other Ambulatory Visit: Payer: Self-pay

## 2024-12-19 ENCOUNTER — Inpatient Hospital Stay (HOSPITAL_COMMUNITY)
Admission: EM | Admit: 2024-12-19 | Discharge: 2024-12-20 | DRG: 287 | Disposition: A | Discharge status: Home / Self Care | Payer: Self-pay | Attending: Cardiovascular Disease | Admitting: Cardiovascular Disease

## 2024-12-19 ENCOUNTER — Inpatient Hospital Stay (HOSPITAL_COMMUNITY): Admission: EM | Disposition: A | Payer: Self-pay | Source: Home / Self Care | Attending: Cardiovascular Disease

## 2024-12-19 ENCOUNTER — Inpatient Hospital Stay (HOSPITAL_COMMUNITY): Payer: Self-pay

## 2024-12-19 ENCOUNTER — Encounter (HOSPITAL_COMMUNITY): Payer: Self-pay | Admitting: Cardiovascular Disease

## 2024-12-19 DIAGNOSIS — F32A Depression, unspecified: Secondary | ICD-10-CM | POA: Diagnosis present

## 2024-12-19 DIAGNOSIS — Z79899 Other long term (current) drug therapy: Secondary | ICD-10-CM

## 2024-12-19 DIAGNOSIS — Z886 Allergy status to analgesic agent status: Secondary | ICD-10-CM

## 2024-12-19 DIAGNOSIS — E785 Hyperlipidemia, unspecified: Secondary | ICD-10-CM | POA: Diagnosis present

## 2024-12-19 DIAGNOSIS — R079 Chest pain, unspecified: Secondary | ICD-10-CM

## 2024-12-19 DIAGNOSIS — Z8711 Personal history of peptic ulcer disease: Secondary | ICD-10-CM

## 2024-12-19 DIAGNOSIS — I10 Essential (primary) hypertension: Secondary | ICD-10-CM | POA: Diagnosis present

## 2024-12-19 DIAGNOSIS — I213 ST elevation (STEMI) myocardial infarction of unspecified site: Secondary | ICD-10-CM

## 2024-12-19 DIAGNOSIS — I2510 Atherosclerotic heart disease of native coronary artery without angina pectoris: Secondary | ICD-10-CM

## 2024-12-19 DIAGNOSIS — Z91010 Allergy to peanuts: Secondary | ICD-10-CM

## 2024-12-19 DIAGNOSIS — I25118 Atherosclerotic heart disease of native coronary artery with other forms of angina pectoris: Principal | ICD-10-CM | POA: Diagnosis present

## 2024-12-19 DIAGNOSIS — I2109 ST elevation (STEMI) myocardial infarction involving other coronary artery of anterior wall: Secondary | ICD-10-CM

## 2024-12-19 HISTORY — PX: LEFT HEART CATH AND CORONARY ANGIOGRAPHY: CATH118249

## 2024-12-19 HISTORY — PX: CORONARY/GRAFT ACUTE MI REVASCULARIZATION: CATH118305

## 2024-12-19 LAB — CBC WITH DIFFERENTIAL/PLATELET
Abs Immature Granulocytes: 0.06 K/uL (ref 0.00–0.07)
Basophils Absolute: 0 K/uL (ref 0.0–0.1)
Basophils Relative: 0 %
Eosinophils Absolute: 0 K/uL (ref 0.0–0.5)
Eosinophils Relative: 1 %
HCT: 43.7 % (ref 39.0–52.0)
Hemoglobin: 14.9 g/dL (ref 13.0–17.0)
Immature Granulocytes: 1 %
Lymphocytes Relative: 25 %
Lymphs Abs: 1.7 K/uL (ref 0.7–4.0)
MCH: 32.1 pg (ref 26.0–34.0)
MCHC: 34.1 g/dL (ref 30.0–36.0)
MCV: 94.2 fL (ref 80.0–100.0)
Monocytes Absolute: 0.8 K/uL (ref 0.1–1.0)
Monocytes Relative: 12 %
Neutro Abs: 4.3 K/uL (ref 1.7–7.7)
Neutrophils Relative %: 61 %
Platelets: 195 K/uL (ref 150–400)
RBC: 4.64 MIL/uL (ref 4.22–5.81)
RDW: 14.5 % (ref 11.5–15.5)
WBC: 6.9 K/uL (ref 4.0–10.5)
nRBC: 0 % (ref 0.0–0.2)

## 2024-12-19 LAB — COMPREHENSIVE METABOLIC PANEL WITH GFR
ALT: 54 U/L — ABNORMAL HIGH (ref 0–44)
AST: 43 U/L — ABNORMAL HIGH (ref 15–41)
Albumin: 3.7 g/dL (ref 3.5–5.0)
Alkaline Phosphatase: 71 U/L (ref 38–126)
Anion gap: 9 (ref 5–15)
BUN: 17 mg/dL (ref 8–23)
CO2: 22 mmol/L (ref 22–32)
Calcium: 8.5 mg/dL — ABNORMAL LOW (ref 8.9–10.3)
Chloride: 97 mmol/L — ABNORMAL LOW (ref 98–111)
Creatinine, Ser: 0.9 mg/dL (ref 0.61–1.24)
GFR, Estimated: >60 mL/min
Glucose, Bld: 118 mg/dL — ABNORMAL HIGH (ref 70–99)
Potassium: 4 mmol/L (ref 3.5–5.1)
Sodium: 128 mmol/L — ABNORMAL LOW (ref 135–145)
Total Bilirubin: 0.4 mg/dL (ref 0.0–1.2)
Total Protein: 6.6 g/dL (ref 6.5–8.1)

## 2024-12-19 LAB — PROTIME-INR
INR: 1.1 (ref 0.8–1.2)
Prothrombin Time: 15.3 s — ABNORMAL HIGH (ref 11.4–15.2)

## 2024-12-19 LAB — CBC
HCT: 48.2 % (ref 39.0–52.0)
Hemoglobin: 16.6 g/dL (ref 13.0–17.0)
MCH: 32 pg (ref 26.0–34.0)
MCHC: 34.4 g/dL (ref 30.0–36.0)
MCV: 92.9 fL (ref 80.0–100.0)
Platelets: 201 K/uL (ref 150–400)
RBC: 5.19 MIL/uL (ref 4.22–5.81)
RDW: 14.4 % (ref 11.5–15.5)
WBC: 7 K/uL (ref 4.0–10.5)
nRBC: 0 % (ref 0.0–0.2)

## 2024-12-19 LAB — ECHOCARDIOGRAM COMPLETE
Area-P 1/2: 4.45 cm2
Calc EF: 66.3 %
Height: 77 in
S' Lateral: 3.2 cm
Single Plane A2C EF: 68.2 %
Single Plane A4C EF: 63.3 %
Weight: 3488.56 [oz_av]

## 2024-12-19 LAB — LIPID PANEL
Cholesterol: 184 mg/dL (ref 0–200)
HDL: 36 mg/dL — ABNORMAL LOW
LDL Cholesterol: 139 mg/dL — ABNORMAL HIGH (ref 0–99)
Total CHOL/HDL Ratio: 5.1 ratio
Triglycerides: 47 mg/dL
VLDL: 9 mg/dL (ref 0–40)

## 2024-12-19 LAB — POCT I-STAT, CHEM 8
BUN: 18 mg/dL (ref 8–23)
Calcium, Ion: 1.22 mmol/L (ref 1.15–1.40)
Chloride: 96 mmol/L — ABNORMAL LOW (ref 98–111)
Creatinine, Ser: 1 mg/dL (ref 0.61–1.24)
Glucose, Bld: 117 mg/dL — ABNORMAL HIGH (ref 70–99)
HCT: 48 % (ref 39.0–52.0)
Hemoglobin: 16.3 g/dL (ref 13.0–17.0)
Potassium: 3.9 mmol/L (ref 3.5–5.1)
Sodium: 132 mmol/L — ABNORMAL LOW (ref 135–145)
TCO2: 22 mmol/L (ref 22–32)

## 2024-12-19 LAB — TROPONIN T, HIGH SENSITIVITY
Troponin T High Sensitivity: 15 ng/L (ref 0–19)
Troponin T High Sensitivity: <15 ng/L (ref 0–19)

## 2024-12-19 LAB — HEMOGLOBIN A1C
Hgb A1c MFr Bld: 5.6 % (ref 4.8–5.6)
Mean Plasma Glucose: 114.02 mg/dL

## 2024-12-19 LAB — I-STAT CG4 LACTIC ACID, ED: Lactic Acid, Venous: 0.9 mmol/L (ref 0.5–1.9)

## 2024-12-19 LAB — CREATININE, SERUM
Creatinine, Ser: 0.97 mg/dL (ref 0.61–1.24)
GFR, Estimated: >60 mL/min

## 2024-12-19 LAB — APTT: aPTT: 175 s (ref 24–36)

## 2024-12-19 MED ORDER — ENOXAPARIN SODIUM 40 MG/0.4ML IJ SOSY
40.0000 mg | PREFILLED_SYRINGE | INTRAMUSCULAR | Status: DC
  Administered 2024-12-20: 40 mg via SUBCUTANEOUS
  Filled 2024-12-19: qty 0.4

## 2024-12-19 MED ORDER — NITROGLYCERIN 1 MG/10 ML FOR IR/CATH LAB
INTRA_ARTERIAL | Status: AC
  Filled 2024-12-19: qty 10

## 2024-12-19 MED ORDER — HEPARIN (PORCINE) IN NACL 1000-0.9 UT/500ML-% IV SOLN
INTRAVENOUS | Status: DC | PRN
  Administered 2024-12-19: 1000 mL via SURGICAL_CAVITY

## 2024-12-19 MED ORDER — PANTOPRAZOLE SODIUM 40 MG PO TBEC
40.0000 mg | DELAYED_RELEASE_TABLET | Freq: Every day | ORAL | Status: DC
  Administered 2024-12-19 – 2024-12-20 (×2): 40 mg via ORAL
  Filled 2024-12-19 (×2): qty 1

## 2024-12-19 MED ORDER — VERAPAMIL HCL 2.5 MG/ML IV SOLN
INTRAVENOUS | Status: AC
  Filled 2024-12-19: qty 2

## 2024-12-19 MED ORDER — FENTANYL CITRATE (PF) 100 MCG/2ML IJ SOLN
INTRAMUSCULAR | Status: AC
  Filled 2024-12-19: qty 2

## 2024-12-19 MED ORDER — LIDOCAINE HCL (PF) 1 % IJ SOLN
INTRAMUSCULAR | Status: DC | PRN
  Administered 2024-12-19: 2 mL

## 2024-12-19 MED ORDER — MIDAZOLAM HCL 2 MG/2ML IJ SOLN
INTRAMUSCULAR | Status: AC
  Filled 2024-12-19: qty 2

## 2024-12-19 MED ORDER — HEPARIN SODIUM (PORCINE) 1000 UNIT/ML IJ SOLN
INTRAMUSCULAR | Status: DC | PRN
  Administered 2024-12-19: 5000 [IU] via INTRAVENOUS

## 2024-12-19 MED ORDER — NITROGLYCERIN 0.4 MG SL SUBL: 0.4000 mg | SUBLINGUAL_TABLET | SUBLINGUAL | Status: DC | PRN

## 2024-12-19 MED ORDER — MIDAZOLAM HCL (PF) 2 MG/2ML IJ SOLN
INTRAMUSCULAR | Status: DC | PRN
  Administered 2024-12-19: 2 mg via INTRAVENOUS

## 2024-12-19 MED ORDER — VERAPAMIL HCL 2.5 MG/ML IV SOLN
INTRAVENOUS | Status: DC | PRN
  Administered 2024-12-19: 10 mL via INTRA_ARTERIAL

## 2024-12-19 MED ORDER — ONDANSETRON HCL 4 MG/2ML IJ SOLN: 4.0000 mg | Freq: Four times a day (QID) | INTRAMUSCULAR | Status: DC | PRN

## 2024-12-19 MED ORDER — ROSUVASTATIN CALCIUM 20 MG PO TABS
20.0000 mg | ORAL_TABLET | Freq: Every day | ORAL | Status: DC
  Administered 2024-12-19 – 2024-12-20 (×2): 20 mg via ORAL
  Filled 2024-12-19 (×2): qty 1

## 2024-12-19 MED ORDER — HEPARIN SODIUM (PORCINE) 1000 UNIT/ML IJ SOLN
INTRAMUSCULAR | Status: AC
  Filled 2024-12-19: qty 10

## 2024-12-19 MED ORDER — ACETAMINOPHEN 325 MG PO TABS: 650.0000 mg | ORAL_TABLET | ORAL | Status: DC | PRN

## 2024-12-19 MED ORDER — IOHEXOL 350 MG/ML SOLN
INTRAVENOUS | Status: DC | PRN
  Administered 2024-12-19: 50 mL via INTRA_ARTERIAL

## 2024-12-19 MED ORDER — AMLODIPINE BESYLATE 5 MG PO TABS
5.0000 mg | ORAL_TABLET | Freq: Every day | ORAL | Status: DC
  Administered 2024-12-19 – 2024-12-20 (×2): 5 mg via ORAL
  Filled 2024-12-19 (×2): qty 1

## 2024-12-19 MED ORDER — LIDOCAINE HCL (PF) 1 % IJ SOLN
INTRAMUSCULAR | Status: AC
  Filled 2024-12-19: qty 30

## 2024-12-19 MED ORDER — FREE WATER
500.0000 mL | Freq: Once | Status: AC
  Administered 2024-12-19: 500 mL via ORAL

## 2024-12-19 MED ORDER — FENTANYL CITRATE (PF) 100 MCG/2ML IJ SOLN
INTRAMUSCULAR | Status: DC | PRN
  Administered 2024-12-19: 25 ug via INTRAVENOUS

## 2024-12-19 NOTE — H&P (Signed)
 "  Cardiology Admission History and Physical   Patient ID: Caleb Rivers MRN: [id number]; DOB: 1956-04-29   Admission date: 12/19/2024  PCP:  Celestia Rosaline SQUIBB, NP   [facility] Providers Cardiologist:  None       Chief Complaint: Chest pain  Patient Profile: Caleb Rivers is a 68 y.o. male with history of hypertension who is being seen 12/19/2024 for the evaluation of chest pain.  History of Present Illness: Caleb Rivers was at his workplace today at [employer].  He developed central chest pain and called EMS.  EKG in the field was diagnostic of anterior STEMI and a code STEMI was paged out.  On arrival here, I interviewed the patient in the emergency room.  He appears comfortable but complains of localized left-sided chest discomfort that has been on and off for 1 week.  This morning symptoms were worse so EMS was called.  He denies shortness of breath, heart palpitations, diaphoresis, nausea, or vomiting.  He denies any recent illness.  Describes the pain as a pressure-like sensation on the center and left side of the chest.  He has a history of gastric ulcer and reports that he has been told not to take aspirin or nonsteroidal anti-inflammatory drugs.  He was hospitalized earlier this year with small bowel obstruction that responded to conservative management with NG tube decompression.  No other health concerns reported.   Past Medical History:  Diagnosis Date   Constipation    Gastric ulcer    Ulcer    Past Surgical History:  Procedure Laterality Date   LAPAROSCOPY N/A 02/26/2023   Procedure: LAPAROSCOPY DIAGNOSTIC;  Surgeon: Teresa Lonni HERO, MD;  Location: [facility] OR;  Service: General;  Laterality: N/A;   LEFT HEART CATHETERIZATION WITH CORONARY ANGIOGRAM N/A 09/25/2013   Procedure: LEFT HEART CATHETERIZATION WITH CORONARY ANGIOGRAM;  Surgeon: Debby DELENA Sor, MD;  Location: [facility] CATH LAB;  Service: Cardiovascular;  Laterality: N/A;     Medications Prior to  Admission: Prior to Admission medications  Medication Sig Start Date End Date Taking? Authorizing Provider  acetaminophen  (TYLENOL ) 500 MG tablet Take 1 tablet (500 mg total) by mouth every 6 (six) hours as needed. Patient taking differently: Take 500 mg by mouth every 6 (six) hours as needed for mild pain (pain score 1-3) or moderate pain (pain score 4-6). 06/19/23   Theadore Caleb HERO, MD  amLODipine  (NORVASC ) 5 MG tablet Take 1 tablet (5 mg total) by mouth daily. 02/17/24   Caleen Qualia, MD  lansoprazole  (PREVACID ) 30 MG capsule Take 1 capsule (30 mg total) by mouth daily. 07/25/24 07/25/25  Tonia Chew, MD  lidocaine  (LIDODERM ) 5 % Place 1 patch onto the skin daily. Remove & Discard patch within 12 hours or as directed by MD 11/11/24   Neysa Caron PARAS, DO  methocarbamol  (ROBAXIN ) 500 MG tablet Take 1 tablet (500 mg total) by mouth 2 (two) times daily. 11/11/24   Neysa Caron PARAS, DO  ondansetron  (ZOFRAN ) 4 MG tablet Take 1 tablet (4 mg total) by mouth every 6 (six) hours as needed for nausea. 02/16/24   Amin, Sumayya, MD  polyethylene glycol powder (MIRALAX ) 17 GM/SCOOP powder Mix 1 capful (17g) in 4-8 ounces of water  twice daily for 3 days, then once daily thereafter. Patient not taking: Reported on 08/01/2024 04/17/24   Beverley Doffing A, PA-C  senna-docusate (SENOKOT-S) 8.6-50 MG tablet Take 1 tablet by mouth daily. 09/11/24   Prosperi, Christian H, PA-C  sucralfate  (CARAFATE ) 1 g tablet Take  1 tablet (1 g total) by mouth 4 (four) times daily -  with meals and at bedtime. 08/03/24   Patsy Lenis, MD     Allergies:   Allergies[1]  Social History:   Social History   Socioeconomic History   Marital status: Single    Spouse name: Not on file   Number of children: Not on file   Years of education: Not on file   Highest education level: Not on file  Occupational History   Not on file  Tobacco Use   Smoking status: Never   Smokeless tobacco: Never  Substance and Sexual Activity   Alcohol use:  No   Drug use: No   Sexual activity: Not Currently  Other Topics Concern   Not on file  Social History Narrative   Psychiatric nurse, not married.   Social Drivers of Health   Tobacco Use: Low Risk (08/01/2024)   Patient History    Smoking Tobacco Use: Never    Smokeless Tobacco Use: Never    Passive Exposure: Not on file  Financial Resource Strain: Not on file  Food Insecurity: No Food Insecurity (08/01/2024)   Epic    Worried About Programme Researcher, Broadcasting/film/video in the Last Year: Never true    Ran Out of Food in the Last Year: Never true  Transportation Needs: No Transportation Needs (08/01/2024)   Epic    Lack of Transportation (Medical): No    Lack of Transportation (Non-Medical): No  Physical Activity: Not on file  Stress: Not on file  Social Connections: Unknown (08/01/2024)   Social Connection and Isolation Panel    Frequency of Communication with Friends and Family: Three times a week    Frequency of Social Gatherings with Friends and Family: Three times a week    Attends Religious Services: 1 to 4 times per year    Active Member of Clubs or Organizations: No    Attends Banker Meetings: 1 to 4 times per year    Marital Status: Patient declined  Intimate Partner Violence: Not At Risk (08/01/2024)   Epic    Fear of Current or Ex-Partner: No    Emotionally Abused: No    Physically Abused: No    Sexually Abused: No  Depression (PHQ2-9): Low Risk (03/02/2024)   Depression (PHQ2-9)    PHQ-2 Score: 0  Alcohol Screen: Not on file  Housing: Low Risk (08/01/2024)   Epic    Unable to Pay for Housing in the Last Year: No    Number of Times Moved in the Last Year: 0    Homeless in the Last Year: No  Utilities: Not At Risk (08/01/2024)   Epic    Threatened with loss of utilities: No  Health Literacy: Not on file     Family History:   The patient's family history is negative for Diabetes Mellitus II and Cancer.    ROS:  Please see the history of present illness.  All other ROS  reviewed and negative.     Physical Exam/Data: There were no vitals filed for this visit. No intake or output data in the 24 hours ending 12/19/24 0733    07/31/2024    8:00 PM 07/25/2024    3:54 PM 03/02/2024   10:18 AM  Last 3 Weights  Weight (lbs) 215 lb 216 lb 7.9 oz 216 lb 6.4 oz  Weight (kg) 97.523 kg 98.2 kg 98.158 kg     There is no height or weight on file to calculate BMI.  General:  Well nourished, well developed, in no acute distress HEENT: normal Neck: no JVD Vascular: No carotid bruits; Distal pulses 2+ bilaterally   Cardiac:  normal S1, S2; RRR; no murmur  Lungs:  clear to auscultation bilaterally, no wheezing, rhonchi or rales  Abd: soft, nontender, no hepatomegaly  Ext: no edema Musculoskeletal:  No deformities, BUE and BLE strength normal and equal Skin: warm and dry  Neuro:  CNs 2-12 intact, no focal abnormalities noted Psych:  Normal affect   EKG:  The ECG that was done was personally reviewed and demonstrates normal sinus rhythm with anteroseptal ST elevation patient consistent with acute STEMI  Relevant CV Studies: Pending  Laboratory Data: High Sensitivity Troponin:  No results for input(s): TROPONINIHS in the last 720 hours. No results for input(s): TRNPT in the last 720 hours.      ChemistryNo results for input(s): NA, K, CL, CO2, GLUCOSE, BUN, CREATININE, CALCIUM , MG, GFRNONAA, GFRAA, ANIONGAP in the last 168 hours.  No results for input(s): PROT, ALBUMIN, AST, ALT, ALKPHOS, BILITOT in the last 168 hours. Lipids No results for input(s): CHOL, TRIG, HDL, LABVLDL, LDLCALC, CHOLHDL in the last 168 hours. HematologyNo results for input(s): WBC, RBC, HGB, HCT, MCV, MCH, MCHC, RDW, PLT in the last 168 hours. Thyroid No results for input(s): TSH, FREET4 in the last 168 hours. BNPNo results for input(s): BNP, PROBNP in the last 168 hours.  DDimer No results for input(s): DDIMER in the  last 168 hours.  Radiology/Studies:  No results found.   Assessment and Plan: Anteroseptal STEMI: EKG diagnostic of STEMI in this patient with stuttering chest pain for 1 week, now with constant left-sided chest pain.  Patient otherwise hemodynamically stable.  Plan emergency cardiac catheterization and possible PCI.  Further plan/disposition pending his cardiac catheterization result.  Will administer IV heparin .  Consider antiplatelet monotherapy with his history of bleeding ulcer.  Will review this history more carefully. Hypertension: Adjust antihypertensive medications to reflect appropriate GDMT Will check lipids and start high intensity statin drug  Further evaluation and treatment pending cardiac catheterization result.  Patient reports no family locally.  Risk Assessment/Risk Scores:   TIMI Risk Score for ST  Elevation MI:   The patient's TIMI risk score is 3, which indicates a 4.4% risk of all cause mortality at 30 days.      Code Status: Full Code  Severity of Illness: The appropriate patient status for this patient is INPATIENT. Inpatient status is judged to be reasonable and necessary in order to provide the required intensity of service to ensure the patient's safety. The patient's presenting symptoms, physical exam findings, and initial radiographic and laboratory data in the context of their chronic comorbidities is felt to place them at high risk for further clinical deterioration. Furthermore, it is not anticipated that the patient will be medically stable for discharge from the hospital within 2 midnights of admission.   * I certify that at the point of admission it is my clinical judgment that the patient will require inpatient hospital care spanning beyond 2 midnights from the point of admission due to high intensity of service, high risk for further deterioration and high frequency of surveillance required.*  For questions or updates, please contact [facility] Please consult [email or web address] for contact info under       Signed, Caleb Fell, MD  12/19/2024 7:33 AM      [1]  Allergies Allergen Reactions   Asa [Aspirin] Other (See Comments)    Current and  Hx of GI ulcer - per Pt and friend/translator   Nsaids Other (See Comments)    Current and Hx of GI ulcer per pt and friend/translator   Peanut-Containing Drug Products Nausea And Vomiting   "

## 2024-12-19 NOTE — TOC CM/SW Note (Signed)
 Transition of Care [facility]) - Inpatient Brief Assessment   Patient Details  Name: Caleb Rivers MRN: [id number] Date of Birth: [birth year]  Transition of Care [facility]) CM/SW Contact:    Waddell Barnie Rama, RN Phone Number: 12/19/2024, 3:40 PM   Clinical Narrative: From home with room mates, has PCP and insurance on file, states has no HH services in place at this time or DME at home.  States a friend will transport them home at [employer], states gets medications from [facility],.  Pta self ambulatory.   He has no insurance, may need ast with medications at discharge.     Transition of Care Asessment: Insurance and Status: Insurance coverage has been reviewed Patient has primary care physician: Yes Home environment has been reviewed: live with room mates Prior level of function:: indep Prior/Current Home Services: No current home services Social Drivers of Health Review: SDOH reviewed no interventions necessary Readmission risk has been reviewed: Yes Transition of care needs: no transition of care needs at this time

## 2024-12-19 NOTE — Progress Notes (Signed)
" °  Echocardiogram 2D Echocardiogram has been performed.  Devora Ellouise SAUNDERS 12/19/2024, 2:49 PM "

## 2024-12-19 NOTE — Progress Notes (Addendum)
" °   12/19/24 0800  Spiritual Encounters  Type of Visit Attempt (pt unavailable)  Care provided to: Pt not available  Referral source Code page  Reason for visit Code  OnCall Visit Yes    Chaplain attempted Code STEMI follow up, however pt Caleb Rivers listed as OTF.  Chaplains continue to remain available. "

## 2024-12-19 NOTE — Interval H&P Note (Signed)
 History and Physical Interval Note:  12/19/2024 7:33 AM  Caleb Rivers  has presented today for surgery, with the diagnosis of STEMI.  The various methods of treatment have been discussed with the patient and family. After consideration of risks, benefits and other options for treatment, the patient has consented to  Procedures: Coronary/Graft Acute MI Revascularization (N/A) LEFT HEART CATH AND CORONARY ANGIOGRAPHY (N/A) as a surgical intervention.  The patient's history has been reviewed, patient examined, no change in status, stable for surgery.  I have reviewed the patient's chart and labs.  Questions were answered to the patient's satisfaction.     [name]

## 2024-12-20 ENCOUNTER — Other Ambulatory Visit (HOSPITAL_COMMUNITY): Payer: Self-pay

## 2024-12-20 DIAGNOSIS — E785 Hyperlipidemia, unspecified: Secondary | ICD-10-CM | POA: Insufficient documentation

## 2024-12-20 DIAGNOSIS — R079 Chest pain, unspecified: Secondary | ICD-10-CM

## 2024-12-20 MED ORDER — ROSUVASTATIN CALCIUM 20 MG PO TABS
20.0000 mg | ORAL_TABLET | Freq: Every day | ORAL | 1 refills | Status: DC
  Filled 2024-12-20: qty 30, 30d supply, fill #0

## 2024-12-20 MED ORDER — AMLODIPINE BESYLATE 5 MG PO TABS
5.0000 mg | ORAL_TABLET | Freq: Every day | ORAL | 1 refills | Status: DC
  Filled 2024-12-20: qty 30, 30d supply, fill #0

## 2024-12-20 MED FILL — Nitroglycerin IV Soln 100 MCG/ML in D5W: INTRA_ARTERIAL | Qty: 10 | Status: AC

## 2024-12-20 NOTE — TOC Progression Note (Addendum)
 Transition of Care [facility]) - Progression Note    Patient Details  Name: Caleb Rivers MRN: [id number] Date of Birth: Feb 19, 1956  Transition [facility]) CM/SW Contact  Waddell Barnie Rama, RN Phone Number: 12/20/2024, 9:49 AM  Clinical Narrative:    MATCH MEDICATION ASSISTANCE CARD Pharmacies please call [phone] for claim processing assistance.  Rx BIN: [id number] Rx Group: [id number] Rx PCN: [id number] Relationship Code: 1 Person Code: 01  Patient ID (MRN): [id number]    Patient Name: Caleb Rivers   Patient DOB: 20-Feb-2056   Discharge Date:12/20/24  Expiration Date:12/28/24 (must be filled within 7 days of discharge)        IN case patient needs ast with medications. Patient meds are 14.00, which he can pay for in cash so will not use the Match Card.               Expected Discharge Plan and Services                                               Social Drivers of Health (SDOH) Interventions SDOH Screenings   Food Insecurity: No Food Insecurity (12/19/2024)  Housing: Unknown (12/19/2024)  Transportation Needs: No Transportation Needs (12/19/2024)  Utilities: Not At Risk (12/19/2024)  Depression (PHQ2-9): Low Risk (03/02/2024)  Social Connections: Moderately Integrated (12/19/2024)  Tobacco Use: Low Risk (08/01/2024)    Readmission Risk Interventions    12/19/2024    3:39 PM 08/01/2024    9:42 AM  Readmission Risk Prevention Plan  Post Dischage Appt  Complete  Medication Screening  Complete  Transportation Screening Complete Complete  Home Care Screening Complete   Medication Review (RN CM) Complete

## 2024-12-20 NOTE — Discharge Summary (Addendum)
 " Discharge Summary   Patient ID: Caleb Rivers MRN: [id number]; DOB: Nov 04, 1956  Admit date: 12/19/2024 Discharge date: 12/20/2024  PCP:  Celestia Rosaline SQUIBB, NP   [facility] Providers Cardiologist:  [name], MD    Discharge Diagnoses  Principal Problem:   Chest pain at rest Active Problems:   Essential hypertension   Hyperlipidemia   Diagnostic Studies/Procedures   Cath: 12/19/2024    Prox Cx to Mid Cx lesion is 40% stenosed.   Prox LAD to Mid LAD lesion is 50% stenosed.   The left ventricular systolic function is normal.   The left ventricular ejection fraction is 55-65% by visual estimate.   1.  Nonobstructive coronary artery disease with 40 to 50% proximal LAD stenosis and 40% mid circumflex stenosis.  No high-grade or flow-limiting coronary obstruction noted. 2.  Normal LV function with no wall motion abnormalities, LVEF estimated at 55 to 60%, normal LVEDP   Suspect noncardiac symptoms.  Recommend: Hospital admission, overnight observation, cycle troponins, check 2D echo, home tomorrow morning if no significant clinical or lab abnormalities    Echo: 12/19/2024  IMPRESSIONS     1. Left ventricular ejection fraction, by estimation, is 60 to 65%. Left  ventricular ejection fraction by 2D MOD biplane is 66.3 %. The left  ventricle has normal function. The left ventricle has no regional wall  motion abnormalities. Left ventricular  diastolic parameters were normal.   2. Right ventricular systolic function is normal. The right ventricular  size is normal.   3. The mitral valve is normal in structure. Mild mitral valve  regurgitation. No evidence of mitral stenosis.   4. The aortic valve is tricuspid. There is mild calcification of the  aortic valve. Aortic valve regurgitation is not visualized. Aortic valve  sclerosis/calcification is present, without any evidence of aortic  stenosis.   5. The inferior vena cava is normal in size with greater  than 50%  respiratory variability, suggesting right atrial pressure of 3 mmHg.   Comparison(s): No prior Echocardiogram.   FINDINGS   Left Ventricle: Left ventricular ejection fraction, by estimation, is 60  to 65%. Left ventricular ejection fraction by 2D MOD biplane is 66.3 %.  The left ventricle has normal function. The left ventricle has no regional  wall motion abnormalities. The  left ventricular internal cavity size was normal in size. There is no left  ventricular hypertrophy. Left ventricular diastolic parameters were  normal.   Right Ventricle: The right ventricular size is normal. No increase in  right ventricular wall thickness. Right ventricular systolic function is  normal.   Left Atrium: Left atrial size was normal in size.   Right Atrium: Right atrial size was normal in size.   Pericardium: There is no evidence of pericardial effusion.   Mitral Valve: The mitral valve is normal in structure. Mild mitral valve  regurgitation. No evidence of mitral valve stenosis.   Tricuspid Valve: The tricuspid valve is normal in structure. Tricuspid  valve regurgitation is trivial. No evidence of tricuspid stenosis.   Aortic Valve: The aortic valve is tricuspid. There is mild calcification  of the aortic valve. Aortic valve regurgitation is not visualized. Aortic  valve sclerosis/calcification is present, without any evidence of aortic  stenosis.   Pulmonic Valve: The pulmonic valve was normal in structure. Pulmonic valve  regurgitation is not visualized. No evidence of pulmonic stenosis.   Aorta: The aortic root and ascending aorta are structurally normal, with  no evidence of dilitation.   Venous:  The right upper pulmonary vein is normal. The inferior vena cava  is normal in size with greater than 50% respiratory variability,  suggesting right atrial pressure of 3 mmHg.   IAS/Shunts: No atrial level shunt detected by color flow Doppler.   _____________   History of  Present Illness   Caleb Rivers is a 68 y.o. male with PMH of HTN who presented with abd/back pain who was called a CODE STEMI in the field. Caleb Rivers was at his workplace at [employer].  He developed central chest pain and called EMS.  EKG in the field was diagnostic of anterior STEMI and a code STEMI was paged out.  On arrival here the patient in the emergency room by Dr. Wonda.  He appeared comfortable but complains of localized left-sided chest discomfort that has been on and off for 1 week.  The morning of admission symptoms were worse so EMS was called.  He denied shortness of breath, heart palpitations, diaphoresis, nausea, or vomiting.  He denied any recent illness.  Described the pain as a pressure-like sensation on the center and left side of the chest.  He has a history of gastric ulcer and reported that he has been told not to take aspirin or nonsteroidal anti-inflammatory drugs.  He was hospitalized earlier this year with small bowel obstruction that responded to conservative management with NG tube decompression.  No other health concerns reported.    Hospital Course    Abnormal EKG Chest pain/Abd/back pain  -- initially EKG concerning for STEMI therefore was taken for emergent cardiac cath.  -- Cath 12/22 as noted above with nonobstructive coronary artery disease with 40 to 50% proximal LAD stenosis and 40% mid circumflex stenosis. No high-grade or flow-limiting coronary obstruction noted -- hsTn negative and echo with LVEF of 60-65%, no rWMA -- no further symptoms  HTN -- initially elevated -- improved with norvasc  5mg  daily  HLD -- LDL 139, HDL 36 -- started on crestor  20mg  daily   General: Well developed, well nourished, male appearing in no acute distress. Head: Normocephalic, atraumatic.  Neck: Supple without bruits, JVD. Lungs:  Resp regular and unlabored, CTA. Heart: RRR, S1, S2, no S3, S4, or murmur; no rub. Abdomen: Soft, non-tender, non-distended with  normoactive bowel sounds.  Extremities: No clubbing, cyanosis, edema. Distal pedal pulses are 2+ bilaterally. Right cath site stable without bruising or hematoma Neuro: Alert and oriented X 3. Moves all extremities spontaneously. Psych: Normal affect.  Patient was seen by myself and Dr. Court and deemed stable for discharge home. Follow up arranged in the office.   Did the patient have an acute coronary syndrome (MI, NSTEMI, STEMI, etc) this admission?:  No                               Did the patient have a percutaneous coronary intervention (stent / angioplasty)?:  No.    _____________  Discharge Vitals Blood pressure (!) 147/84, pulse 89, temperature 98 F (36.7 C), temperature source Oral, resp. rate 16, height 6' 5 (1.956 m), weight 98.9 kg, SpO2 100%.  Filed Weights   12/19/24 1102  Weight: 98.9 kg    Labs & Radiologic Studies  CBC Recent Labs    12/19/24 0744 12/19/24 0753 12/19/24 1120  WBC 6.9  --  7.0  NEUTROABS 4.3  --   --   HGB 14.9 16.3 16.6  HCT 43.7 48.0 48.2  MCV 94.2  --  92.9  PLT 195  --  201   Basic Metabolic Panel Recent Labs    87/77/74 0744 12/19/24 0753 12/19/24 1120  NA 128* 132*  --   K 4.0 3.9  --   CL 97* 96*  --   CO2 22  --   --   GLUCOSE 118* 117*  --   BUN 17 18  --   CREATININE 0.90 1.00 0.97  CALCIUM  8.5*  --   --    Liver Function Tests Recent Labs    12/19/24 0744  AST 43*  ALT 54*  ALKPHOS 71  BILITOT 0.4  PROT 6.6  ALBUMIN 3.7   No results for input(s): LIPASE, AMYLASE in the last 72 hours. High Sensitivity Troponin:   No results for input(s): TROPONINIHS in the last 720 hours.  Recent Labs  Lab 12/19/24 0744 12/19/24 1120  TRNPT 15 <15    BNP Invalid input(s): POCBNP No results for input(s): PROBNP in the last 72 hours.  No results for input(s): BNP in the last 72 hours.  D-Dimer No results for input(s): DDIMER in the last 72 hours. Hemoglobin A1C Recent Labs    12/19/24 0744  HGBA1C  5.6   Fasting Lipid Panel Recent Labs    12/19/24 0744  CHOL 184  HDL 36*  LDLCALC 139*  TRIG 47  CHOLHDL 5.1   No results found for: LIPOA  Thyroid Function Tests No results for input(s): TSH, T4TOTAL, T3FREE, THYROIDAB in the last 72 hours.  Invalid input(s): FREET3 _____________  ECHOCARDIOGRAM COMPLETE Result Date: 12/19/2024    ECHOCARDIOGRAM REPORT   Patient Name:   MANCEL LARDIZABAL Date of Exam: 12/19/2024 Medical Rec #:  [id number]       Height:       77.0 in Accession #:    [id number]      Weight:       218.0 lb Date of Birth:  07-23-1956      BSA:          2.320 m Patient Age:    68 years        BP:           150/92 mmHg Patient Gender: M               HR:           71 bpm. Exam Location:  Inpatient Procedure: 2D Echo, Cardiac Doppler and Color Doppler (Both Spectral and Color            Flow Doppler were utilized during procedure). Indications:    R07.9* Chest pain, unspecified  History:        Patient has no prior history of Echocardiogram examinations.                 Abnormal ECG; Risk Factors:Hypertension.  Sonographer:    Ellouise Mose RDCS Referring Phys: [phone] MICHAEL COOPER  Sonographer Comments: Patient is post PCI with no intervention. IMPRESSIONS  1. Left ventricular ejection fraction, by estimation, is 60 to 65%. Left ventricular ejection fraction by 2D MOD biplane is 66.3 %. The left ventricle has normal function. The left ventricle has no regional wall motion abnormalities. Left ventricular diastolic parameters were normal.  2. Right ventricular systolic function is normal. The right ventricular size is normal.  3. The mitral valve is normal in structure. Mild mitral valve regurgitation. No evidence of mitral stenosis.  4. The aortic valve is tricuspid. There is mild calcification of the aortic valve. Aortic valve regurgitation  is not visualized. Aortic valve sclerosis/calcification is present, without any evidence of aortic stenosis.  5. The inferior vena cava is  normal in size with greater than 50% respiratory variability, suggesting right atrial pressure of 3 mmHg. Comparison(s): No prior Echocardiogram. FINDINGS  Left Ventricle: Left ventricular ejection fraction, by estimation, is 60 to 65%. Left ventricular ejection fraction by 2D MOD biplane is 66.3 %. The left ventricle has normal function. The left ventricle has no regional wall motion abnormalities. The left ventricular internal cavity size was normal in size. There is no left ventricular hypertrophy. Left ventricular diastolic parameters were normal. Right Ventricle: The right ventricular size is normal. No increase in right ventricular wall thickness. Right ventricular systolic function is normal. Left Atrium: Left atrial size was normal in size. Right Atrium: Right atrial size was normal in size. Pericardium: There is no evidence of pericardial effusion. Mitral Valve: The mitral valve is normal in structure. Mild mitral valve regurgitation. No evidence of mitral valve stenosis. Tricuspid Valve: The tricuspid valve is normal in structure. Tricuspid valve regurgitation is trivial. No evidence of tricuspid stenosis. Aortic Valve: The aortic valve is tricuspid. There is mild calcification of the aortic valve. Aortic valve regurgitation is not visualized. Aortic valve sclerosis/calcification is present, without any evidence of aortic stenosis. Pulmonic Valve: The pulmonic valve was normal in structure. Pulmonic valve regurgitation is not visualized. No evidence of pulmonic stenosis. Aorta: The aortic root and ascending aorta are structurally normal, with no evidence of dilitation. Venous: The right upper pulmonary vein is normal. The inferior vena cava is normal in size with greater than 50% respiratory variability, suggesting right atrial pressure of 3 mmHg. IAS/Shunts: No atrial level shunt detected by color flow Doppler.  LEFT VENTRICLE PLAX 2D                        Biplane EF (MOD) LVIDd:         4.50 cm          LV Biplane EF:   Left LVIDs:         3.20 cm                          ventricular LV PW:         0.80 cm                          ejection LV IVS:        0.90 cm                          fraction by LVOT diam:     2.10 cm                          2D MOD LV SV:         68                               biplane is LV SV Index:   29                               66.3 %. LVOT Area:     3.46 cm LV IVRT:       88 msec  Diastology                                LV e' medial:    7.51 cm/s                                LV E/e' medial:  19.0 LV Volumes (MOD)               LV e' lateral:   13.10 cm/s LV vol d, MOD    81.8 ml       LV E/e' lateral: 10.9 A2C: LV vol d, MOD    92.1 ml A4C: LV vol s, MOD    26.0 ml A2C: LV vol s, MOD    33.8 ml A4C: LV SV MOD A2C:   55.8 ml LV SV MOD A4C:   92.1 ml LV SV MOD BP:    59.3 ml RIGHT VENTRICLE             IVC RV S prime:     13.40 cm/s  IVC diam: 2.00 cm TAPSE (M-mode): 2.1 cm                             PULMONARY VEINS                             Diastolic Velocity: 68.50 cm/s                             S/D Velocity:       1.00                             Systolic Velocity:  66.50 cm/s LEFT ATRIUM             Index        RIGHT ATRIUM           Index LA diam:        2.20 cm 0.95 cm/m   RA Area:     10.30 cm LA Vol (A2C):   29.1 ml 12.54 ml/m  RA Volume:   22.70 ml  9.78 ml/m LA Vol (A4C):   22.0 ml 9.48 ml/m LA Biplane Vol: 25.6 ml 11.03 ml/m  AORTIC VALVE LVOT Vmax:   102.00 cm/s LVOT Vmean:  67.600 cm/s LVOT VTI:    0.197 m  AORTA Ao Root diam: 2.00 cm Ao Asc diam:  3.10 cm MITRAL VALVE MV Area (PHT): 4.45 cm     SHUNTS MV Decel Time: 171 msec     Systemic VTI:  0.20 m MV E velocity: 142.50 cm/s  Systemic Diam: 2.10 cm MV A velocity: 98.70 cm/s MV E/A ratio:  1.44 Caleb Fuel MD Electronically signed by Caleb Fuel MD Signature Date/Time: 12/19/2024/6:51:54 PM    Final    CARDIAC CATHETERIZATION Result Date: 12/19/2024   Prox Cx to Mid Cx lesion is 40%  stenosed.   Prox LAD to Mid LAD lesion is 50% stenosed.   The left ventricular systolic function is normal.   The left ventricular ejection fraction is 55-65% by visual estimate. 1.  Nonobstructive coronary artery disease with 40 to 50% proximal LAD stenosis and 40% mid circumflex stenosis.  No high-grade or flow-limiting coronary obstruction noted. 2.  Normal LV function with no wall motion abnormalities, LVEF estimated at 55 to 60%, normal LVEDP Suspect noncardiac symptoms.  Recommend: Hospital admission, overnight observation, cycle troponins, check 2D echo, home tomorrow morning if no significant clinical or lab abnormalities    Disposition Caleb Rivers is being discharged home today in good condition.  Follow-up Plans & Appointments  Follow-up Information     Celestia Rosaline SQUIBB, NP Follow up.   Specialty: Internal Medicine Contact information: [address] [place] [address] [phone]                Discharge Instructions     Call MD for:  difficulty breathing, headache or visual disturbances   Complete by: As directed    Call MD for:  persistant dizziness or light-headedness   Complete by: As directed    Call MD for:  redness, tenderness, or signs of infection (pain, swelling, redness, odor or green/yellow discharge around incision site)   Complete by: As directed    Discharge instructions   Complete by: As directed    Radial Site Care Refer to this sheet in the next few weeks. These instructions provide you with information on caring for yourself after your procedure. Your caregiver may also give you more specific instructions. Your treatment has been planned according to current medical practices, but problems sometimes occur. Call your caregiver if you have any problems or questions after your procedure. HOME CARE INSTRUCTIONS You may shower the day after the procedure. Remove the bandage (dressing) and gently wash the site with plain soap and water . Gently pat the  site dry.  Do not apply powder or lotion to the site.  Do not submerge the affected site in water  for 3 to 5 days.  Inspect the site at least twice daily.  Do not flex or bend the affected arm for 24 hours.  No lifting over 5 pounds (2.3 kg) for 5 days after your procedure.  Do not drive home if you are discharged the same day of the procedure. Have someone else drive you.  You may drive 24 hours after the procedure unless otherwise instructed by your caregiver.  What to expect: Any bruising will usually fade within 1 to 2 weeks.  Blood that collects in the tissue (hematoma) may be painful to the touch. It should usually decrease in size and tenderness within 1 to 2 weeks.  SEEK IMMEDIATE MEDICAL CARE IF: You have unusual pain at the radial site.  You have redness, warmth, swelling, or pain at the radial site.  You have drainage (other than a small amount of blood on the dressing).  You have chills.  You have a fever or persistent symptoms for more than 72 hours.  You have a fever and your symptoms suddenly get worse.  Your arm becomes pale, cool, tingly, or numb.  You have heavy bleeding from the site. Hold pressure on the site.   Increase activity slowly   Complete by: As directed        Discharge Medications Allergies as of 12/20/2024       Reactions   Asa [aspirin] Other (See Comments)   Current and Hx of GI ulcer - per Caleb Rivers and friend/translator   Nsaids Other (See Comments)   Current and Hx of GI ulcer per Caleb Rivers and friend/translator   Peanut-containing Drug Products Nausea And Vomiting        Medication List     STOP taking these  medications    lansoprazole  30 MG capsule Commonly known as: Prevacid    lidocaine  5 % Commonly known as: Lidoderm    methocarbamol  500 MG tablet Commonly known as: ROBAXIN    sucralfate  1 g tablet Commonly known as: Carafate        TAKE these medications    acetaminophen  500 MG tablet Commonly known as: TYLENOL  Take 1 tablet (500  mg total) by mouth every 6 (six) hours as needed.   amLODipine  5 MG tablet Commonly known as: NORVASC  Take 1 tablet (5 mg total) by mouth daily.   rosuvastatin  20 MG tablet Commonly known as: CRESTOR  Take 1 tablet (20 mg total) by mouth daily. Start taking on: December 21, 2024         Outstanding Labs/Studies  FLP/LFTs in 8 weeks   Duration of Discharge Encounter: APP Time: 20 minutes   Signed, Manuelita Rummer, NP 12/20/2024, 11:26 AM  Agree with note by Manuelita Rummer NP-C  Patient admitted with chest pain and an abnormal EKG with J-point elevation in leads V1 and V2.  He was Urgently as a STEMI by Dr. Wonda who found mild nonobstructive CAD and normal LV function.  His troponins were negative.  He has had no further symptoms.  He does have mild hyperlipidemia on statin therapy.  No further workup necessary at this time.  Okay to discharge home.  Will arrange outpatient follow-up.  Dorn DOROTHA Lesches, M.D., FACP, [facility], LYNITA [facility] [address] [place], [place]  [address]  [phone] 12/20/2024 12:01 PM    "

## 2024-12-20 NOTE — Progress Notes (Signed)
 Reviewed AVS, patient expressed understanding of medications, MD follow up reviewed.  Used camera operator for French translation with [employer].  Removed IV, Site clean, dry and intact.  Patient states all belongings brought to the hospital at time of admission are accounted for and packed to take home.  Picked up medications from [facility] pharmacy. [facility] Transport contacted to transport patient to Discharge lounge to wait for taxi voucher.

## 2024-12-20 NOTE — Progress Notes (Addendum)
 Morning EKG showing STEMI in leads v1, v2, and v3. Patient was code stemi but no recent EKG available for comparison. Patient denies distress besides hunger. On call cardiologist notified.  Addendum: on call cardiologist returned page stating that the EKG is consistent with previously reviewed strip. No interventions required at this time.

## 2024-12-21 ENCOUNTER — Telehealth: Payer: Self-pay

## 2024-12-21 NOTE — Transitions of Care (Post Inpatient/ED Visit) (Signed)
" ° °  12/21/2024  Name: Chastin Riesgo MRN: [id number] DOB: 12-Jul-1956  Today's TOC FU Call Status: Today's TOC FU Call Status:: Unsuccessful Call (1st Attempt) Unsuccessful Call (1st Attempt) Date: 12/21/24  Attempted to reach the patient regarding the most recent Inpatient/ED visit.  Follow Up Plan: Additional outreach attempts will be made to reach the patient to complete the Transitions of Care (Post Inpatient/ED visit) call.    Call placed with assistance of French interpreter: [id number]/[employer] Signature Slater Diesel, RN   "

## 2024-12-23 ENCOUNTER — Telehealth: Payer: Self-pay

## 2024-12-23 NOTE — Transitions of Care (Post Inpatient/ED Visit) (Signed)
" ° °  12/23/2024  Name: Caleb Rivers MRN: [id number] DOB: 05-16-1956  Today's TOC FU Call Status: Today's TOC FU Call Status:: Unsuccessful Call (2nd Attempt) Unsuccessful Call (1st Attempt) Date: 12/21/24 Unsuccessful Call (2nd Attempt) Date: 12/23/24  Attempted to reach the patient regarding the most recent Inpatient/ED visit.  Follow Up Plan: Additional outreach attempts will be made to reach the patient to complete the Transitions of Care (Post Inpatient/ED visit) call.   The call was placed with the assistance of French interpreter: [id number]/[employer]  Signature  Slater Diesel, RN   "

## 2024-12-23 NOTE — Transitions of Care (Post Inpatient/ED Visit) (Signed)
" ° °  12/23/2024  Name: [name] MRN: [id number] DOB: 1956/12/16  Today's TOC FU Call Status: Today's TOC FU Call Status:: Unsuccessful Call (3rd Attempt) Unsuccessful Call (1st Attempt) Date: 12/21/24 Unsuccessful Call (2nd Attempt) Date: 12/23/24 Unsuccessful Call (3rd Attempt) Date: 12/23/24  Attempted to reach the patient regarding the most recent Inpatient/ED visit.  Follow Up Plan: No further outreach attempts will be made at this time. We have been unable to contact the patient.  Call placed with assistance of French interpreter: [id number]/[employer].   Letter sent to patient requesting he contact [facility] to schedule an appointment as we have not been able to reach him.   Signature  Slater Diesel, RN   "

## 2024-12-30 NOTE — Progress Notes (Signed)
 "  Cardiology Office Note    Date:  01/05/2025  ID:  Caleb, Rivers 23-Sep-1956, MRN [id number] PCP:  Celestia Rosaline SQUIBB, NP  Cardiologist:  [name], MD  Electrophysiologist:  None   Chief Complaint: Follow up for HTN   History of Present Illness: .   Caleb Rivers is a 69 y.o. male with visit-pertinent history of hypertension, peptic ulcer diease.  Patient was admitted in 01/2024 with constipation and abdominal pain, found to have small bowel obstruction, resolved with NG tube decompression.  On 12/19/2024 patient developed central chest pain and called EMS.  EKG in field was diagnostic of anterior STEMI and a code STEMI was paged out.  On arrival patient was seen by Dr. Fell.  He appeared comfortable and complained of localized left-sided chest discomfort that have been on and off for 1 week.  He denied any shortness of breath, heart palpitations, diaphoresis, nausea or vomiting.  Patient was taken urgently for cardiac cath on 12/22 with nonobstructive coronary artery disease with 40 to 50% proximal LAD stenosis and 40% mid circumflex stenosis, no high-grade or flow-limiting coronary obstruction noted, high sensitive troponin was negative and echocardiogram indicated LVEF 66 5%, no RWMA.  Patient's blood pressure was initially elevated, improved with Norvasc  5 mg daily.  He was also started on Crestor  20 mg daily.  Today he presents for follow up. He reports that he has been doing well overall.  He denies any further chest pain, shortness of breath, lower extremity edema, orthopnea or PND.  He denies any palpitations, presyncope or syncope.  Patient notes that he previously exercise regularly, has not been doing in recent months, plans to resume.  He reports that he is tolerating Crestor  and amlodipine  well, denies any concerns or complaints.  Patient notes history of ulcers, notes some intermittent acid reflux, plans to follow-up with a PCP for ongoing management.  Patient's primary  language is French, interpreter services was present throughout the visit, assisted by PAP [id number].  ROS: .   Today he denies chest pain, shortness of breath, lower extremity edema, fatigue, palpitations, melena, hematuria, hemoptysis, diaphoresis, weakness, presyncope, syncope, orthopnea, and PND.  All other systems are reviewed and otherwise negative. Studies Reviewed: SABRA   EKG:  EKG is ordered today, personally reviewed, demonstrating  EKG Interpretation Date/Time:  Thursday January 05 2025 10:22:06 EST Ventricular Rate:  74 PR Interval:  138 QRS Duration:  72 QT Interval:  372 QTC Calculation: 412 R Axis:   -51  Text Interpretation: Normal sinus rhythm Left anterior fascicular block Cannot rule out Anterior infarct When compared with ECG of 20-Dec-2024 05:15, No significant change was found Confirmed by Nicki Gracy [phone]) on 01/05/2025 10:51:36 AM   CV Studies: Cardiac studies reviewed are outlined and summarized above. Otherwise please see EMR for full report. Cardiac Studies & Procedures   ______________________________________________________________________________________________ CARDIAC CATHETERIZATION  CARDIAC CATHETERIZATION 12/19/2024  Conclusion   Prox Cx to Mid Cx lesion is 40% stenosed.   Prox LAD to Mid LAD lesion is 50% stenosed.   The left ventricular systolic function is normal.   The left ventricular ejection fraction is 55-65% by visual estimate.  1.  Nonobstructive coronary artery disease with 40 to 50% proximal LAD stenosis and 40% mid circumflex stenosis.  No high-grade or flow-limiting coronary obstruction noted. 2.  Normal LV function with no wall motion abnormalities, LVEF estimated at 55 to 60%, normal LVEDP  Suspect noncardiac symptoms.  Recommend: Hospital admission, overnight observation, cycle troponins, check 2D echo,  home tomorrow morning if no significant clinical or lab abnormalities  Findings Coronary Findings Diagnostic  Dominance:  Right  Left Main The vessel exhibits minimal luminal irregularities.  Left Anterior Descending There is mild diffuse disease throughout the vessel. Prox LAD to Mid LAD lesion is 50% stenosed.  Left Circumflex There is mild diffuse disease throughout the vessel. Prox Cx to Mid Cx lesion is 40% stenosed.  Right Coronary Artery There is mild diffuse disease throughout the vessel. Dominant vessel, moderate caliber, with mild irregularity and slow flow.  There are no discrete lesions noted.  No obstructive disease.  Intervention  No interventions have been documented.     ECHOCARDIOGRAM  ECHOCARDIOGRAM COMPLETE 12/19/2024  Narrative ECHOCARDIOGRAM REPORT    Patient Name:   Caleb Rivers Date of Exam: 12/19/2024 Medical Rec #:  [id number]       Height:       77.0 in Accession #:    [id number]      Weight:       218.0 lb Date of Birth:  1956-02-02      BSA:          2.320 m Patient Age:    69 years        BP:           150/92 mmHg Patient Gender: M               HR:           71 bpm. Exam Location:  Inpatient  Procedure: 2D Echo, Cardiac Doppler and Color Doppler (Both Spectral and Color Flow Doppler were utilized during procedure).  Indications:    R07.9* Chest pain, unspecified  History:        Patient has no prior history of Echocardiogram examinations. Abnormal ECG; Risk Factors:Hypertension.  Sonographer:    Ellouise Mose RDCS Referring Phys: [phone] MICHAEL COOPER   Sonographer Comments: Patient is post PCI with no intervention. IMPRESSIONS   1. Left ventricular ejection fraction, by estimation, is 60 to 65%. Left ventricular ejection fraction by 2D MOD biplane is 66.3 %. The left ventricle has normal function. The left ventricle has no regional wall motion abnormalities. Left ventricular diastolic parameters were normal. 2. Right ventricular systolic function is normal. The right ventricular size is normal. 3. The mitral valve is normal in structure. Mild mitral  valve regurgitation. No evidence of mitral stenosis. 4. The aortic valve is tricuspid. There is mild calcification of the aortic valve. Aortic valve regurgitation is not visualized. Aortic valve sclerosis/calcification is present, without any evidence of aortic stenosis. 5. The inferior vena cava is normal in size with greater than 50% respiratory variability, suggesting right atrial pressure of 3 mmHg.  Comparison(s): No prior Echocardiogram.  FINDINGS Left Ventricle: Left ventricular ejection fraction, by estimation, is 60 to 65%. Left ventricular ejection fraction by 2D MOD biplane is 66.3 %. The left ventricle has normal function. The left ventricle has no regional wall motion abnormalities. The left ventricular internal cavity size was normal in size. There is no left ventricular hypertrophy. Left ventricular diastolic parameters were normal.  Right Ventricle: The right ventricular size is normal. No increase in right ventricular wall thickness. Right ventricular systolic function is normal.  Left Atrium: Left atrial size was normal in size.  Right Atrium: Right atrial size was normal in size.  Pericardium: There is no evidence of pericardial effusion.  Mitral Valve: The mitral valve is normal in structure. Mild mitral valve regurgitation. No evidence of mitral valve stenosis.  Tricuspid Valve: The tricuspid valve is normal in structure. Tricuspid valve regurgitation is trivial. No evidence of tricuspid stenosis.  Aortic Valve: The aortic valve is tricuspid. There is mild calcification of the aortic valve. Aortic valve regurgitation is not visualized. Aortic valve sclerosis/calcification is present, without any evidence of aortic stenosis.  Pulmonic Valve: The pulmonic valve was normal in structure. Pulmonic valve regurgitation is not visualized. No evidence of pulmonic stenosis.  Aorta: The aortic root and ascending aorta are structurally normal, with no evidence of  dilitation.  Venous: The right upper pulmonary vein is normal. The inferior vena cava is normal in size with greater than 50% respiratory variability, suggesting right atrial pressure of 3 mmHg.  IAS/Shunts: No atrial level shunt detected by color flow Doppler.   LEFT VENTRICLE PLAX 2D                        Biplane EF (MOD) LVIDd:         4.50 cm         LV Biplane EF:   Left LVIDs:         3.20 cm                          ventricular LV PW:         0.80 cm                          ejection LV IVS:        0.90 cm                          fraction by LVOT diam:     2.10 cm                          2D MOD LV SV:         68                               biplane is LV SV Index:   29                               66.3 %. LVOT Area:     3.46 cm LV IVRT:       88 msec         Diastology LV e' medial:    7.51 cm/s LV E/e' medial:  19.0 LV Volumes (MOD)               LV e' lateral:   13.10 cm/s LV vol d, MOD    81.8 ml       LV E/e' lateral: 10.9 A2C: LV vol d, MOD    92.1 ml A4C: LV vol s, MOD    26.0 ml A2C: LV vol s, MOD    33.8 ml A4C: LV SV MOD A2C:   55.8 ml LV SV MOD A4C:   92.1 ml LV SV MOD BP:    59.3 ml  RIGHT VENTRICLE             IVC RV S prime:     13.40 cm/s  IVC diam: 2.00 cm TAPSE (M-mode): 2.1 cm PULMONARY VEINS Diastolic Velocity: 68.50 cm/s S/D Velocity:  1.00 Systolic Velocity:  66.50 cm/s  LEFT ATRIUM             Index        RIGHT ATRIUM           Index LA diam:        2.20 cm 0.95 cm/m   RA Area:     10.30 cm LA Vol (A2C):   29.1 ml 12.54 ml/m  RA Volume:   22.70 ml  9.78 ml/m LA Vol (A4C):   22.0 ml 9.48 ml/m LA Biplane Vol: 25.6 ml 11.03 ml/m AORTIC VALVE LVOT Vmax:   102.00 cm/s LVOT Vmean:  67.600 cm/s LVOT VTI:    0.197 m  AORTA Ao Root diam: 2.00 cm Ao Asc diam:  3.10 cm  MITRAL VALVE MV Area (PHT): 4.45 cm     SHUNTS MV Decel Time: 171 msec     Systemic VTI:  0.20 m MV E velocity: 142.50 cm/s  Systemic Diam: 2.10 cm MV A  velocity: 98.70 cm/s MV E/A ratio:  1.44  Toribio Fuel MD Electronically signed by Toribio Fuel MD Signature Date/Time: 12/19/2024/6:51:54 PM    Final          ______________________________________________________________________________________________       Current Reported Medications:.    Active Medications[1]  Physical Exam:    VS:  BP 126/82   Pulse 74   Ht 6' 5 (1.956 m)   Wt 214 lb 6.4 oz (97.3 kg)   SpO2 99%   BMI 25.42 kg/m    Wt Readings from Last 3 Encounters:  01/05/25 214 lb 6.4 oz (97.3 kg)  12/19/24 218 lb 0.6 oz (98.9 kg)  07/31/24 215 lb (97.5 kg)    GEN: Well nourished, well developed in no acute distress NECK: No JVD; No carotid bruits CARDIAC: RRR, no murmurs, rubs, gallops RESPIRATORY:  Clear to auscultation without rales, wheezing or rhonchi  ABDOMEN: Soft, non-tender, non-distended EXTREMITIES:  No edema; No acute deformity     Asessement and Plan:.    Chest pain/abn EKG: Patient initially presented on 12/22 concerning for STEMI, was taken for emergent cardiac cath.  Was noted to have nonobstructive CAD with 40 to 50% proximal LAD stenosis and 40% mid circumflex stenosis.  No high-grade or flow-limiting coronary obstruction noted.  High sensitive troponin was negative and echo indicated LVEF 66 5%, no RWMA. EKG today with no significant changes, on review of prior EKGs has had J-point elevation in anterior leads. Stable with no anginal symptoms. No indication for ischemic evaluation.  Heart healthy diet and regular cardiovascular exercise encouraged.  Cath site is clean and intact without evidence of hematoma.  Reviewed ED precautions. Continue amlodipine  5 mg daily and Crestor  20 mg daily.  Check fasting lipid profile and LFTs in 8 weeks.  Hypertension: Blood pressure today 126/82.  Continue current antihypertensive regimen.  Hyperlipidemia: LDL on admission 139, HDL 36.  He was start on Crestor  20 mg daily.  Check fasting lipid  profile and LFTs in 6 weeks.   Disposition: F/u with Chapel Silverthorn, NP in six months or sooner if needed, reviewed with patient once established with PCP could consider following up as needed.   Signed, Malaak Stach D Faryn Sieg, NP       [1]  Current Meds  Medication Sig   acetaminophen  (TYLENOL ) 500 MG tablet Take 1 tablet (500 mg total) by mouth every 6 (six) hours as needed.   [DISCONTINUED] amLODipine  (NORVASC ) 5 MG tablet Take 1 tablet (5 mg  total) by mouth daily.   [DISCONTINUED] rosuvastatin  (CRESTOR ) 20 MG tablet Take 1 tablet (20 mg total) by mouth daily.   "

## 2025-01-05 ENCOUNTER — Other Ambulatory Visit (HOSPITAL_COMMUNITY): Payer: Self-pay

## 2025-01-05 ENCOUNTER — Ambulatory Visit: Payer: Self-pay | Attending: Cardiology | Admitting: Cardiology

## 2025-01-05 ENCOUNTER — Encounter: Payer: Self-pay | Admitting: Cardiology

## 2025-01-05 VITALS — BP 126/82 | HR 74 | Ht 77.0 in | Wt 214.4 lb

## 2025-01-05 DIAGNOSIS — I10 Essential (primary) hypertension: Secondary | ICD-10-CM

## 2025-01-05 DIAGNOSIS — Z7689 Persons encountering health services in other specified circumstances: Secondary | ICD-10-CM

## 2025-01-05 DIAGNOSIS — R079 Chest pain, unspecified: Secondary | ICD-10-CM

## 2025-01-05 DIAGNOSIS — E782 Mixed hyperlipidemia: Secondary | ICD-10-CM

## 2025-01-05 MED ORDER — ROSUVASTATIN CALCIUM 20 MG PO TABS
20.0000 mg | ORAL_TABLET | Freq: Every day | ORAL | 3 refills | Status: AC
  Filled 2025-01-05 – 2025-01-19 (×2): qty 90, 90d supply, fill #0

## 2025-01-05 MED ORDER — AMLODIPINE BESYLATE 5 MG PO TABS
5.0000 mg | ORAL_TABLET | Freq: Every day | ORAL | 3 refills | Status: AC
  Filled 2025-01-05 – 2025-01-19 (×2): qty 90, 90d supply, fill #0

## 2025-01-05 NOTE — Patient Instructions (Signed)
 Medication Instructions:  Your physician recommends that you continue on your current medications as directed. Please refer to the Current Medication list given to you today.  *If you need a refill on your cardiac medications before your next appointment, please call your pharmacy*  Lab Work: IN 8 weeks: Fasting Lipids, LFTs  If you have labs (blood work) drawn today and your tests are completely normal, you will receive your results only by: MyChart Message (if you have MyChart) OR A paper copy in the mail If you have any lab test that is abnormal or we need to change your treatment, we will call you to review the results.  Testing/Procedures: NONE  Follow-Up: At [facility], you and your health needs are our priority.  As part of our continuing mission to provide you with exceptional heart care, our providers are all part of one team.  This team includes your primary Cardiologist (physician) and Advanced Practice Providers or APPs (Physician Assistants and Nurse Practitioners) who all work together to provide you with the care you need, when you need it.  Your next appointment:   6 month(s)  Provider:   [name], MD    We recommend signing up for the patient portal called MyChart.  Sign up information is provided on this After Visit Summary.  MyChart is used to connect with patients for Virtual Visits (Telemedicine).  Patients are able to view lab/test results, encounter notes, upcoming appointments, etc.  Non-urgent messages can be sent to your provider as well.   To learn more about what you can do with MyChart, go to [email or web address].   Other Instructions Please contact [facility] :[phone] at your earliest convenience to schedule this appointment.

## 2025-01-06 ENCOUNTER — Telehealth: Payer: Self-pay | Admitting: Licensed Clinical Social Worker

## 2025-01-06 NOTE — Progress Notes (Signed)
 " Heart and Vascular Care Navigation  01/06/2025  [name] 1956/01/24 [id number]  Reason for Referral: self pay   Engaged with patient by telephone for initial visit for Heart and Vascular Care Coordination.                                                                                                   Assessment:                                     LCSW was able to reach Caleb Rivers after 2 calls to [phone] with assistance of French language interpreter [employer], [phone]. Introduced self, role, reason for call confirmed full name, DOB and home address. Caleb Rivers resides with a roommate who he sees occasionally. He works full time, has worked for the same company for about 19 years. Is not a current citizen but has SSN and pays taxes annually. Eligible to apply for assistance with medical bills through [employer]. We verbally completed application with interpreter since it is not translated to French. Caleb Rivers has friend who reads English and can assist if needed (Caleb Rivers does understand some English) and is agreeable to reaching out to me with any additional questions/concerns during the process. No financial concerns at this time, shares he is interested in additional food resources if possible. Appreciative of call and aware I will f/u to ensure he feels okay completing application and gathering documents.   HRT/VAS Care Coordination     Patients Home Cardiology Office [facility]   Outpatient Care Team Social Worker   Social Worker Name: Marit Lark, [place], [phone]   Living arrangements for the past 2 months Apartment   Lives with: Roommate   Patient Current Insurance Coverage Self-Pay   Patient Has Concern With Paying Medical Bills Yes   Patient Concerns With Medical Bills self pay, not medicaid or medicare eligible   Medical Bill Referrals: [employer]   Does Patient Have Prescription Coverage? No   Home Assistive Devices/Equipment None   DME Agency NA   HH Agency NA   Current home services --  NA        Social History:                                                                             SDOH Screenings   Food Insecurity: No Food Insecurity (01/06/2025)  Housing: Low Risk (01/06/2025)  Transportation Needs: No Transportation Needs (01/06/2025)  Utilities: Not At Risk (01/06/2025)  Depression (PHQ2-9): Low Risk (03/02/2024)  Financial Resource Strain: Low Risk (01/06/2025)  Social Connections: Moderately Integrated (12/19/2024)  Tobacco Use: Low Risk (01/05/2025)  Health Literacy: Inadequate Health Literacy (01/06/2025)    SDOH Interventions: Financial Resources:  Financial Strain Interventions: Other (Comment) (mostly concern  related to medical care costs, not eligible for Medicaid/Medicare- sent info about [employer]) Financial Counseling for [employer] Program  Food Insecurity:  Food Insecurity Interventions: [employer] Provided (Caleb Rivers has no concerns but open to receiving resources- aware that packet is in English, able to use addresses to find help if needed)  Housing Insecurity:  Housing Interventions: Intervention Not Indicated  Transportation:   Transportation Interventions: Intervention Not Indicated     Other Care Navigation Interventions:     Provided Pharmacy assistance resources  Caleb Rivers denies any issues obtaining his medications   Follow-up plan:   LCSW was able to send Caleb Rivers the following: [employer] assistance application with notation on where to sign, and Caleb Rivers aware of what documents to mail in. Has assistance of friends should additional interpretation be needed, also sent my card and food resources. Will f/u to ensure Caleb Rivers received and answer any additional questions he may have.     "

## 2025-01-19 ENCOUNTER — Telehealth: Payer: Self-pay | Admitting: Licensed Clinical Social Worker

## 2025-01-19 ENCOUNTER — Other Ambulatory Visit (HOSPITAL_COMMUNITY): Payer: Self-pay

## 2025-01-19 NOTE — Telephone Encounter (Signed)
 H&V Care Navigation CSW Progress Note  Clinical Social Worker contacted patient by phone to f/u on patient assistance resources. No answer, left voicemail with assistance of French interpreter Doyal, [phone].   Patient is participating in a Managed Medicaid Plan:  No, self pay only  SDOH Screenings   Food Insecurity: No Food Insecurity (01/06/2025)  Housing: Low Risk (01/06/2025)  Transportation Needs: No Transportation Needs (01/06/2025)  Utilities: Not At Risk (01/06/2025)  Depression (PHQ2-9): Low Risk (03/02/2024)  Financial Resource Strain: Low Risk (01/06/2025)  Social Connections: Moderately Integrated (12/19/2024)  Tobacco Use: Low Risk (01/05/2025)  Health Literacy: Inadequate Health Literacy (01/06/2025)    Marit Lark, MSW, LCSW Clinical Social Worker II [facility] Heart/Vascular Care Navigation  [phone]- work cell phone (preferred)

## 2025-01-27 ENCOUNTER — Other Ambulatory Visit (HOSPITAL_COMMUNITY): Payer: Self-pay

## 2025-01-27 ENCOUNTER — Telehealth: Payer: Self-pay | Admitting: Licensed Clinical Social Worker

## 2025-01-27 NOTE — Telephone Encounter (Signed)
 H&V Care Navigation CSW Progress Note  Clinical Social Worker contacted patient by phone to f/u on patient assistance resources. No answer, left 2nd voicemail with assistance of French interpreter [place], [phone].    Patient is participating in a Managed Medicaid Plan:  No, self pay only  SDOH Screenings   Food Insecurity: No Food Insecurity (01/06/2025)  Housing: Low Risk (01/06/2025)  Transportation Needs: No Transportation Needs (01/06/2025)  Utilities: Not At Risk (01/06/2025)  Depression (PHQ2-9): Low Risk (03/02/2024)  Financial Resource Strain: Low Risk (01/06/2025)  Social Connections: Moderately Integrated (12/19/2024)  Tobacco Use: Low Risk (01/05/2025)  Health Literacy: Inadequate Health Literacy (01/06/2025)    Marit Lark, MSW, LCSW Clinical Social Worker II [facility] Heart/Vascular Care Navigation  [phone]- work cell phone (preferred)

## 2025-02-01 ENCOUNTER — Other Ambulatory Visit: Payer: Self-pay

## 2025-02-01 ENCOUNTER — Encounter (HOSPITAL_COMMUNITY): Payer: Self-pay | Admitting: *Deleted

## 2025-02-01 ENCOUNTER — Emergency Department (HOSPITAL_COMMUNITY): Payer: Self-pay

## 2025-02-01 ENCOUNTER — Emergency Department (HOSPITAL_COMMUNITY)
Admission: EM | Admit: 2025-02-01 | Discharge: 2025-02-02 | Disposition: A | Payer: Self-pay | Attending: Emergency Medicine | Admitting: Emergency Medicine

## 2025-02-01 DIAGNOSIS — R109 Unspecified abdominal pain: Secondary | ICD-10-CM | POA: Insufficient documentation

## 2025-02-01 DIAGNOSIS — R112 Nausea with vomiting, unspecified: Secondary | ICD-10-CM | POA: Insufficient documentation

## 2025-02-01 DIAGNOSIS — E871 Hypo-osmolality and hyponatremia: Secondary | ICD-10-CM | POA: Insufficient documentation

## 2025-02-01 LAB — URINALYSIS, ROUTINE W REFLEX MICROSCOPIC
Bilirubin Urine: NEGATIVE
Glucose, UA: NEGATIVE mg/dL
Hgb urine dipstick: NEGATIVE
Ketones, ur: NEGATIVE mg/dL
Leukocytes,Ua: NEGATIVE
Nitrite: NEGATIVE
Protein, ur: NEGATIVE mg/dL
Specific Gravity, Urine: 1.008 (ref 1.005–1.030)
pH: 7 (ref 5.0–8.0)

## 2025-02-01 LAB — CBC WITH DIFFERENTIAL/PLATELET
Abs Immature Granulocytes: 0.03 10*3/uL (ref 0.00–0.07)
Basophils Absolute: 0.1 10*3/uL (ref 0.0–0.1)
Basophils Relative: 1 %
Eosinophils Absolute: 0.1 10*3/uL (ref 0.0–0.5)
Eosinophils Relative: 1 %
HCT: 48.7 % (ref 39.0–52.0)
Hemoglobin: 17.2 g/dL — ABNORMAL HIGH (ref 13.0–17.0)
Immature Granulocytes: 0 %
Lymphocytes Relative: 28 %
Lymphs Abs: 2.1 10*3/uL (ref 0.7–4.0)
MCH: 32 pg (ref 26.0–34.0)
MCHC: 35.3 g/dL (ref 30.0–36.0)
MCV: 90.7 fL (ref 80.0–100.0)
Monocytes Absolute: 0.8 10*3/uL (ref 0.1–1.0)
Monocytes Relative: 10 %
Neutro Abs: 4.3 10*3/uL (ref 1.7–7.7)
Neutrophils Relative %: 60 %
Platelets: 205 10*3/uL (ref 150–400)
RBC: 5.37 MIL/uL (ref 4.22–5.81)
RDW: 13.2 % (ref 11.5–15.5)
WBC: 7.3 10*3/uL (ref 4.0–10.5)
nRBC: 0 % (ref 0.0–0.2)

## 2025-02-01 LAB — LIPASE, BLOOD: Lipase: 24 U/L (ref 11–51)

## 2025-02-01 LAB — I-STAT CHEM 8, ED
BUN: 11 mg/dL (ref 8–23)
Calcium, Ion: 1.12 mmol/L — ABNORMAL LOW (ref 1.15–1.40)
Chloride: 94 mmol/L — ABNORMAL LOW (ref 98–111)
Creatinine, Ser: 1 mg/dL (ref 0.61–1.24)
Glucose, Bld: 116 mg/dL — ABNORMAL HIGH (ref 70–99)
HCT: 54 % — ABNORMAL HIGH (ref 39.0–52.0)
Hemoglobin: 18.4 g/dL — ABNORMAL HIGH (ref 13.0–17.0)
Potassium: 4.2 mmol/L (ref 3.5–5.1)
Sodium: 129 mmol/L — ABNORMAL LOW (ref 135–145)
TCO2: 22 mmol/L (ref 22–32)

## 2025-02-01 LAB — COMPREHENSIVE METABOLIC PANEL WITH GFR
ALT: 70 U/L — ABNORMAL HIGH (ref 0–44)
AST: 57 U/L — ABNORMAL HIGH (ref 15–41)
Albumin: 4.3 g/dL (ref 3.5–5.0)
Alkaline Phosphatase: 83 U/L (ref 38–126)
Anion gap: 11 (ref 5–15)
BUN: 11 mg/dL (ref 8–23)
CO2: 23 mmol/L (ref 22–32)
Calcium: 9.3 mg/dL (ref 8.9–10.3)
Chloride: 92 mmol/L — ABNORMAL LOW (ref 98–111)
Creatinine, Ser: 0.97 mg/dL (ref 0.61–1.24)
GFR, Estimated: >60 mL/min
Glucose, Bld: 115 mg/dL — ABNORMAL HIGH (ref 70–99)
Potassium: 4.3 mmol/L (ref 3.5–5.1)
Sodium: 125 mmol/L — ABNORMAL LOW (ref 135–145)
Total Bilirubin: 0.6 mg/dL (ref 0.0–1.2)
Total Protein: 8.3 g/dL — ABNORMAL HIGH (ref 6.5–8.1)

## 2025-02-01 LAB — TROPONIN T, HIGH SENSITIVITY
Troponin T High Sensitivity: 14 ng/L (ref 0–19)
Troponin T High Sensitivity: 13 ng/L (ref 0–19)

## 2025-02-01 MED ORDER — ONDANSETRON HCL 4 MG/2ML IJ SOLN
4.0000 mg | Freq: Once | INTRAMUSCULAR | Status: AC
  Administered 2025-02-01: 4 mg via INTRAVENOUS
  Filled 2025-02-01: qty 2

## 2025-02-01 MED ORDER — ONDANSETRON 4 MG PO TBDP: 4.0000 mg | ORAL_TABLET | Freq: Once | ORAL | Status: DC

## 2025-02-01 MED ORDER — IOHEXOL 350 MG/ML SOLN
75.0000 mL | Freq: Once | INTRAVENOUS | Status: AC | PRN
  Administered 2025-02-01: 75 mL via INTRAVENOUS

## 2025-02-01 NOTE — ED Provider Triage Note (Signed)
 Emergency Medicine Provider Triage Evaluation Note  Caleb Rivers , a 69 y.o. male  was evaluated in triage.  Pt complains of epigastric abdominal pain that radiates up into the chest for the past 3 days. Also reports nausea and vomiting. Reports normal bowel movements.   Review of Systems  Positive: As above Negative: As above  Physical Exam  BP (!) 157/98 (BP Location: Right Arm)   Pulse 88   Temp 98 F (36.7 C) (Oral)   Resp 18   Ht 6' 5 (1.956 m)   Wt 97.3 kg   SpO2 97%   BMI 25.44 kg/m  Gen:   Awake, no distress   Resp:  Normal effort  MSK:   Moves extremities without difficulty  Other:  No significant abdominal tenderness to palpation   Medical Decision Making  Medically screening exam initiated at 3:43 PM.  Appropriate orders placed.  Caleb Rivers was informed that the remainder of the evaluation will be completed by another provider, this initial triage assessment does not replace that evaluation, and the importance of remaining in the ED until their evaluation is complete.     Veta Palma, PA-C 02/01/25 [phone]

## 2025-02-01 NOTE — ED Triage Notes (Signed)
 The pt is c/o abd pain for 3 days with nausea and vomiting  vomiting on arrival

## 2025-02-01 NOTE — ED Notes (Signed)
 The patient was ambulatory to room with independent steady gait.

## 2025-02-02 ENCOUNTER — Other Ambulatory Visit (HOSPITAL_COMMUNITY): Payer: Self-pay

## 2025-02-02 ENCOUNTER — Telehealth (HOSPITAL_BASED_OUTPATIENT_CLINIC_OR_DEPARTMENT_OTHER): Payer: Self-pay | Admitting: Licensed Clinical Social Worker

## 2025-02-02 MED ORDER — MAALOX MAX 400-400-40 MG/5ML PO SUSP
15.0000 mL | Freq: Four times a day (QID) | ORAL | 0 refills | Status: AC | PRN
  Filled 2025-02-02 – 2025-02-03 (×3): qty 355, 6d supply, fill #0

## 2025-02-02 MED ORDER — METRONIDAZOLE 500 MG PO TABS
500.0000 mg | ORAL_TABLET | Freq: Two times a day (BID) | ORAL | 0 refills | Status: AC
  Filled 2025-02-02 (×2): qty 10, 5d supply, fill #0

## 2025-02-02 MED ORDER — METRONIDAZOLE 500 MG PO TABS
500.0000 mg | ORAL_TABLET | Freq: Once | ORAL | Status: AC
  Administered 2025-02-02: 500 mg via ORAL
  Filled 2025-02-02: qty 1

## 2025-02-02 MED ORDER — ALUM & MAG HYDROXIDE-SIMETH 200-200-20 MG/5ML PO SUSP
30.0000 mL | Freq: Once | ORAL | Status: AC
  Administered 2025-02-02: 30 mL via ORAL
  Filled 2025-02-02: qty 30

## 2025-02-02 MED ORDER — PANTOPRAZOLE SODIUM 40 MG IV SOLR
40.0000 mg | Freq: Once | INTRAVENOUS | Status: AC
  Administered 2025-02-02: 40 mg via INTRAVENOUS
  Filled 2025-02-02: qty 10

## 2025-02-02 MED ORDER — FAMOTIDINE 20 MG PO TABS
20.0000 mg | ORAL_TABLET | Freq: Once | ORAL | Status: AC
  Administered 2025-02-02: 20 mg via ORAL
  Filled 2025-02-02: qty 1

## 2025-02-02 MED ORDER — ONDANSETRON HCL 4 MG/2ML IJ SOLN
4.0000 mg | Freq: Once | INTRAMUSCULAR | Status: AC
  Administered 2025-02-02: 4 mg via INTRAVENOUS
  Filled 2025-02-02: qty 2

## 2025-02-02 MED ORDER — PANTOPRAZOLE SODIUM 20 MG PO TBEC
20.0000 mg | DELAYED_RELEASE_TABLET | Freq: Every day | ORAL | 0 refills | Status: AC
  Filled 2025-02-02 (×2): qty 30, 30d supply, fill #0

## 2025-02-02 MED ORDER — LACTATED RINGERS IV BOLUS
2000.0000 mL | Freq: Once | INTRAVENOUS | Status: AC
  Administered 2025-02-02: 2000 mL via INTRAVENOUS

## 2025-02-02 NOTE — ED Provider Notes (Incomplete)
 "  EMERGENCY DEPARTMENT AT [facility] Provider Note   CSN: [id number] Arrival date & time: 02/01/25  1508    French interpreter used for all conversations.  History Chief Complaint  Patient presents with   Abdominal Pain    HPI Caleb Rivers is a 69 y.o. male presenting for chief complaint of abdominal pain. Patient states that he has been having back and stomach pain for the past few weeks States worsening N/V since Thursday.   PMH of gastritis/PUD/SBO/HTN Patient's recorded medical, surgical, social, medication list and allergies were reviewed in the Snapshot window as part of the initial history.   Review of Systems   Review of Systems  Constitutional:  Negative for chills and fever.  HENT:  Negative for ear pain and sore throat.   Eyes:  Negative for pain and visual disturbance.  Respiratory:  Negative for cough and shortness of breath.   Cardiovascular:  Negative for chest pain and palpitations.  Gastrointestinal:  Positive for abdominal pain, nausea and vomiting.  Genitourinary:  Negative for dysuria and hematuria.  Musculoskeletal:  Negative for arthralgias and back pain.  Skin:  Negative for color change and rash.  Neurological:  Negative for seizures and syncope.  All other systems reviewed and are negative.   Physical Exam Updated Vital Signs BP (!) 146/75   Pulse 76   Temp 98 F (36.7 C)   Resp 16   Ht 6' 5 (1.956 m)   Wt 97.3 kg   SpO2 100%   BMI 25.44 kg/m  Physical Exam Vitals and nursing note reviewed.  Constitutional:      Appearance: He is well-developed.  HENT:     Head: Normocephalic and atraumatic.     Nose: No congestion or rhinorrhea.     Mouth/Throat:     Mouth: Mucous membranes are moist.     Pharynx: Oropharynx is clear. No oropharyngeal exudate.  Eyes:     Conjunctiva/sclera: Conjunctivae normal.     Pupils: Pupils are equal, round, and reactive to light.  Cardiovascular:     Rate and Rhythm: Normal rate  and regular rhythm.     Heart sounds: No murmur heard. Pulmonary:     Effort: Pulmonary effort is normal. No respiratory distress.     Breath sounds: Normal breath sounds.  Abdominal:     Palpations: Abdomen is soft.     Tenderness: There is abdominal tenderness. There is no guarding.  Musculoskeletal:        General: No swelling, tenderness, deformity or signs of injury. Normal range of motion.     Cervical back: Neck supple. No rigidity or tenderness.  Skin:    General: Skin is warm and dry.  Neurological:     General: No focal deficit present.     Mental Status: He is alert and oriented to person, place, and time. Mental status is at baseline.     Cranial Nerves: No cranial nerve deficit.     Motor: No weakness.      ED Course/ Medical Decision Making/ A&P    Procedures Procedures   Medications Ordered in ED Medications  ondansetron  (ZOFRAN ) injection 4 mg (4 mg Intravenous Given 02/01/25 1602)  iohexol  (OMNIPAQUE ) 350 MG/ML injection 75 mL (75 mLs Intravenous Contrast Given 02/01/25 1952)   Medical Decision Making:   Caleb Rivers is a 69 y.o. male who presented to the ED today with abdominal pain, detailed above.    Patient placed on continuous vitals and telemetry monitoring while in  ED which was reviewed periodically.  Complete initial physical exam performed, notably the patient  was ***.     Reviewed and confirmed nursing documentation for past medical history, family history, social history.    Initial Assessment:   With the patient's presentation of abdominal pain, most likely diagnosis is ***. Other diagnoses were considered including (but not limited to) gastroenteritis, colitis, small bowel obstruction, appendicitis, cholecystitis, pancreatitis, nephrolithiasis, UTI, pyleonephritis, ***ruptured ectopic pregnancy, PID, ovarian/***testicular torsion. These are considered less likely due to history of present illness and physical exam findings.   {crccopa:27899}    Initial Plan:  CBC/CMP to evaluate for underlying infectious/metabolic etiology for patient's abdominal pain  Lipase to evaluate for pancreatitis  EKG to evaluate for cardiac source of pain  ***CTAB/Pelvis with contrast to evaluate for structural/surgical etiology of patients' severe abdominal pain.  ***CT Ab/pelvis without contrast due to favored nephrolithiasis over GI etiology for patient's abdominal pain  ***Testicular ***Pelvic US  to evaluate for torsion  Urinalysis and repeat physical assessment to evaluate for UTI/Pyelonpehritis  Empiric management of symptoms with escalating pain control and antiemetics as needed.   Initial Study Results:   Laboratory  All laboratory results reviewed without evidence of clinically relevant pathology.   ***Exceptions include: ***    ***EKG ***EKG was reviewed independently. Rate, rhythm, axis, intervals all examined and without medically relevant abnormality. ST segments without concerns for elevations.    Radiology All images reviewed independently. ***Agree with radiology report at this time.   CT ABDOMEN PELVIS W CONTRAST Result Date: 02/01/2025 EXAM: CT ABDOMEN AND PELVIS WITH CONTRAST 02/01/2025 07:51:00 PM TECHNIQUE: CT of the abdomen and pelvis was performed with the administration of 75 mL of iohexol  (OMNIPAQUE ) 350 MG/ML injection. Multiplanar reformatted images are provided for review. Automated exposure control, iterative reconstruction, and/or weight-based adjustment of the mA/kV was utilized to reduce the radiation dose to as low as reasonably achievable. COMPARISON: None available. CLINICAL HISTORY: Abdominal pain, acute, nonlocalized; Possible SBO. Acute, nonlocalized abdominal pain; possible small bowel obstruction. FINDINGS: LOWER CHEST: No acute abnormality. LIVER: The liver is unremarkable. GALLBLADDER AND BILE DUCTS: Gallbladder is unremarkable. No biliary ductal dilatation. SPLEEN: No acute abnormality. PANCREAS: No acute  abnormality. ADRENAL GLANDS: No acute abnormality. KIDNEYS, URETERS AND BLADDER: No stones in the kidneys or ureters. No hydronephrosis. No perinephric or periureteral stranding. Urinary bladder is unremarkable. GI AND BOWEL: Small hiatal hernia. Mild wall thickening of the ascending and transverse colon suggesting mild colitis. Stomach demonstrates no acute abnormality. There is no bowel obstruction. PERITONEUM AND RETROPERITONEUM: No ascites. No free air. VASCULATURE: Aortic atherosclerotic calcification. Aorta is normal in caliber. LYMPH NODES: No lymphadenopathy. REPRODUCTIVE ORGANS: No acute abnormality. BONES AND SOFT TISSUES: No acute osseous abnormality. No focal soft tissue abnormality. IMPRESSION: 1. Mild wall thickening of the ascending and transverse colon, suggestive of mild colitis. Electronically signed by: Norman Gatlin MD 02/01/2025 07:58 PM EST RP Workstation: [id number]   DG Chest 2 View Result Date: 02/01/2025 CLINICAL DATA:  Abdominal pain for 3 days, nausea and vomiting EXAM: CHEST - 2 VIEW COMPARISON:  07/31/2024 FINDINGS: Frontal and lateral views of the chest demonstrate an unremarkable cardiac silhouette. No acute airspace disease, effusion, or pneumothorax. No acute bony abnormalities. IMPRESSION: 1. No acute intrathoracic process. Electronically Signed   By: Ozell Daring M.D.   On: 02/01/2025 16:32     Consults: Case discussed with ***.   Final Reassessment and Plan:   ***       Clinical Impression: No diagnosis found.  Data Unavailable   Final Clinical Impression(s) / ED Diagnoses Final diagnoses:  None    Rx / DC Orders ED Discharge Orders     None       "

## 2025-02-02 NOTE — ED Provider Notes (Signed)
 "  EMERGENCY DEPARTMENT AT [facility] Provider Note   CSN: [id number] Arrival date & time: 02/01/25  1508    French interpreter used for all conversations.  History Chief Complaint  Patient presents with   Abdominal Pain    HPI Caleb Rivers is a 69 y.o. male presenting for chief complaint of abdominal pain. Patient states that he has been having back and stomach pain for the past few weeks States worsening N/V since Thursday.   PMH of gastritis/PUD/SBO/HTN Patient's recorded medical, surgical, social, medication list and allergies were reviewed in the Snapshot window as part of the initial history.   Review of Systems   Review of Systems  Constitutional:  Negative for chills and fever.  HENT:  Negative for ear pain and sore throat.   Eyes:  Negative for pain and visual disturbance.  Respiratory:  Negative for cough and shortness of breath.   Cardiovascular:  Negative for chest pain and palpitations.  Gastrointestinal:  Positive for abdominal pain, nausea and vomiting.  Genitourinary:  Negative for dysuria and hematuria.  Musculoskeletal:  Negative for arthralgias and back pain.  Skin:  Negative for color change and rash.  Neurological:  Negative for seizures and syncope.  All other systems reviewed and are negative.   Physical Exam Updated Vital Signs BP 128/76   Pulse 62   Temp 98 F (36.7 C) (Oral)   Resp 17   Ht 6' 5 (1.956 m)   Wt 97.3 kg   SpO2 100%   BMI 25.44 kg/m  Physical Exam Vitals and nursing note reviewed.  Constitutional:      Appearance: He is well-developed.  HENT:     Head: Normocephalic and atraumatic.     Nose: No congestion or rhinorrhea.     Mouth/Throat:     Mouth: Mucous membranes are moist.     Pharynx: Oropharynx is clear. No oropharyngeal exudate.  Eyes:     Conjunctiva/sclera: Conjunctivae normal.     Pupils: Pupils are equal, round, and reactive to light.  Cardiovascular:     Rate and Rhythm: Normal rate  and regular rhythm.     Heart sounds: No murmur heard. Pulmonary:     Effort: Pulmonary effort is normal. No respiratory distress.     Breath sounds: Normal breath sounds.  Abdominal:     Palpations: Abdomen is soft.     Tenderness: There is abdominal tenderness. There is no guarding.  Musculoskeletal:        General: No swelling, tenderness, deformity or signs of injury. Normal range of motion.     Cervical back: Neck supple. No rigidity or tenderness.  Skin:    General: Skin is warm and dry.  Neurological:     General: No focal deficit present.     Mental Status: He is alert and oriented to person, place, and time. Mental status is at baseline.     Cranial Nerves: No cranial nerve deficit.     Motor: No weakness.      ED Course/ Medical Decision Making/ A&P    Procedures Procedures   Medications Ordered in ED Medications  ondansetron  (ZOFRAN ) injection 4 mg (4 mg Intravenous Given 02/01/25 1602)  iohexol  (OMNIPAQUE ) 350 MG/ML injection 75 mL (75 mLs Intravenous Contrast Given 02/01/25 1952)  lactated ringers  bolus 2,000 mL (0 mLs Intravenous Stopped 02/02/25 0408)  ondansetron  (ZOFRAN ) injection 4 mg (4 mg Intravenous Given 02/02/25 0042)  pantoprazole  (PROTONIX ) injection 40 mg (40 mg Intravenous Given 02/02/25 0042)  metroNIDAZOLE  (  FLAGYL ) tablet 500 mg (500 mg Oral Given 02/02/25 0040)  alum & mag hydroxide-simeth (MAALOX/MYLANTA) 200-200-20 MG/5ML suspension 30 mL (30 mLs Oral Given 02/02/25 0041)  famotidine  (PEPCID ) tablet 20 mg (20 mg Oral Given 02/02/25 0040)   Medical Decision Making:   Caleb Rivers is a 69 y.o. male who presented to the ED today with abdominal pain, detailed above.    Patient placed on continuous vitals and telemetry monitoring while in ED which was reviewed periodically.  Complete initial physical exam performed, notably the patient  was HDS in NAD.     Reviewed and confirmed nursing documentation for past medical history, family history, social history.     Initial Assessment:   With the patient's presentation of abdominal pain, most likely diagnosis is nonspecific etiology/recurrence of his gastritis. Other diagnoses were considered including (but not limited to) gastroenteritis, colitis, small bowel obstruction, appendicitis, cholecystitis, pancreatitis, nephrolithiasis, UTI, pyleonephritis. These are considered less likely due to history of present illness and physical exam findings.   This is most consistent with an acute life/limb threatening illness complicated by underlying chronic conditions.   Initial Plan:  CBC/CMP to evaluate for underlying infectious/metabolic etiology for patient's abdominal pain  Lipase to evaluate for pancreatitis  EKG to evaluate for cardiac source of pain  CTAB/Pelvis with contrast to evaluate for structural/surgical etiology of patients' severe abdominal pain.  Urinalysis and repeat physical assessment to evaluate for UTI/Pyelonpehritis  Empiric management of symptoms with escalating pain control and antiemetics as needed.   Initial Study Results:   Laboratory  All laboratory results reviewed without evidence of clinically relevant pathology.   Exceptions include: mild hyponatremia    EKG EKG was reviewed independently. Rate, rhythm, axis, intervals all examined and without medically relevant abnormality. ST segments without concerns for elevations.    Radiology All images reviewed independently. Agree with radiology report at this time.   CT ABDOMEN PELVIS W CONTRAST Result Date: 02/01/2025 EXAM: CT ABDOMEN AND PELVIS WITH CONTRAST 02/01/2025 07:51:00 PM TECHNIQUE: CT of the abdomen and pelvis was performed with the administration of 75 mL of iohexol  (OMNIPAQUE ) 350 MG/ML injection. Multiplanar reformatted images are provided for review. Automated exposure control, iterative reconstruction, and/or weight-based adjustment of the mA/kV was utilized to reduce the radiation dose to as low as reasonably  achievable. COMPARISON: None available. CLINICAL HISTORY: Abdominal pain, acute, nonlocalized; Possible SBO. Acute, nonlocalized abdominal pain; possible small bowel obstruction. FINDINGS: LOWER CHEST: No acute abnormality. LIVER: The liver is unremarkable. GALLBLADDER AND BILE DUCTS: Gallbladder is unremarkable. No biliary ductal dilatation. SPLEEN: No acute abnormality. PANCREAS: No acute abnormality. ADRENAL GLANDS: No acute abnormality. KIDNEYS, URETERS AND BLADDER: No stones in the kidneys or ureters. No hydronephrosis. No perinephric or periureteral stranding. Urinary bladder is unremarkable. GI AND BOWEL: Small hiatal hernia. Mild wall thickening of the ascending and transverse colon suggesting mild colitis. Stomach demonstrates no acute abnormality. There is no bowel obstruction. PERITONEUM AND RETROPERITONEUM: No ascites. No free air. VASCULATURE: Aortic atherosclerotic calcification. Aorta is normal in caliber. LYMPH NODES: No lymphadenopathy. REPRODUCTIVE ORGANS: No acute abnormality. BONES AND SOFT TISSUES: No acute osseous abnormality. No focal soft tissue abnormality. IMPRESSION: 1. Mild wall thickening of the ascending and transverse colon, suggestive of mild colitis. Electronically signed by: Norman Gatlin MD 02/01/2025 07:58 PM EST RP Workstation: [id number]   DG Chest 2 View Result Date: 02/01/2025 CLINICAL DATA:  Abdominal pain for 3 days, nausea and vomiting EXAM: CHEST - 2 VIEW COMPARISON:  07/31/2024 FINDINGS: Frontal and lateral views of  the chest demonstrate an unremarkable cardiac silhouette. No acute airspace disease, effusion, or pneumothorax. No acute bony abnormalities. IMPRESSION: 1. No acute intrathoracic process. Electronically Signed   By: Ozell Daring M.D.   On: 02/01/2025 16:32   Final Reassessment and Plan:   Patient's presentation most consistent with mild colitis seen on CT. Likely his N/V is his gastritis flaring as he has a formal diagnosis but has been off therapy due  to loss of follow up.  Will restart PPI for gastritis, refer to local PCP for ongoing care and start flagyll for colitis.         Clinical Impression:  1. Abdominal pain, unspecified abdominal location      Discharge   Final Clinical Impression(s) / ED Diagnoses Final diagnoses:  Abdominal pain, unspecified abdominal location    Rx / DC Orders ED Discharge Orders          Ordered    pantoprazole  (PROTONIX ) 20 MG tablet  Daily        02/02/25 0423    metroNIDAZOLE  (FLAGYL ) 500 MG tablet  2 times daily        02/02/25 0423    alum & mag hydroxide-simeth (MAALOX MAX) 400-400-40 MG/5ML suspension  Every 6 hours PRN        02/02/25 0423              Jerral Meth, MD 02/02/25 0425  "

## 2025-02-02 NOTE — Discharge Instructions (Addendum)
 Please call your primary care provider.  Their phone number is attached.  If you are no longer seeing this provider, please call the [facility] scheduling line which is also attached. Take medications as prescribed, follow-up with primary doctor within 5 days for reassessment of your symptoms.

## 2025-02-03 ENCOUNTER — Other Ambulatory Visit (HOSPITAL_COMMUNITY): Payer: Self-pay

## 2025-02-03 ENCOUNTER — Other Ambulatory Visit: Payer: Self-pay

## 2025-02-03 NOTE — Telephone Encounter (Signed)
 H&V Care Navigation CSW Progress Note  Clinical Social Worker contacted patient by phone to f/u on assistance resources and CAFA sent to pt, was able to reach him today at [phone]. With assistance of a French language interpreter Adrien [phone] I was able to confirm pt received application, has been working on it and will gather needed documents. We confirmed he understands where to submit application and I will f/u again next week to ensure pt has sent it in to patient accounting without any additional issues.  Patient is participating in a Managed Medicaid Plan:  No, self pay only  SDOH Screenings   Food Insecurity: No Food Insecurity (01/06/2025)  Housing: Low Risk (01/06/2025)  Transportation Needs: No Transportation Needs (01/06/2025)  Utilities: Not At Risk (01/06/2025)  Depression (PHQ2-9): Low Risk (03/02/2024)  Financial Resource Strain: Low Risk (01/06/2025)  Social Connections: Moderately Integrated (12/19/2024)  Tobacco Use: Low Risk (02/01/2025)  Health Literacy: Inadequate Health Literacy (01/06/2025)    Marit Lark, MSW, LCSW Clinical Social Worker II [facility] Heart/Vascular Care Navigation  [phone]- work cell phone (preferred)
